# Patient Record
Sex: Female | Born: 1970 | Race: White | Hispanic: No | Marital: Married | Smoking: Never smoker
Health system: Southern US, Community
[De-identification: ages and names within clinical notes are randomized; demographics above are authoritative.]

## PROBLEM LIST (undated history)

## (undated) DIAGNOSIS — E063 Autoimmune thyroiditis: Secondary | ICD-10-CM

## (undated) DIAGNOSIS — F32A Depression, unspecified: Secondary | ICD-10-CM

## (undated) DIAGNOSIS — F329 Major depressive disorder, single episode, unspecified: Secondary | ICD-10-CM

## (undated) DIAGNOSIS — R002 Palpitations: Secondary | ICD-10-CM

## (undated) DIAGNOSIS — N200 Calculus of kidney: Secondary | ICD-10-CM

## (undated) DIAGNOSIS — G43909 Migraine, unspecified, not intractable, without status migrainosus: Secondary | ICD-10-CM

## (undated) HISTORY — PX: WISDOM TOOTH EXTRACTION: SHX21

## (undated) HISTORY — DX: Palpitations: R00.2

---

## 1998-04-30 ENCOUNTER — Inpatient Hospital Stay (HOSPITAL_COMMUNITY): Admission: AD | Admit: 1998-04-30 | Discharge: 1998-04-30 | Payer: Self-pay | Admitting: *Deleted

## 1998-06-19 ENCOUNTER — Ambulatory Visit (HOSPITAL_COMMUNITY): Admission: RE | Admit: 1998-06-19 | Discharge: 1998-06-19 | Payer: Self-pay | Admitting: Obstetrics and Gynecology

## 1998-08-27 ENCOUNTER — Inpatient Hospital Stay (HOSPITAL_COMMUNITY): Admission: AD | Admit: 1998-08-27 | Discharge: 1998-08-30 | Payer: Self-pay | Admitting: Obstetrics & Gynecology

## 1998-08-30 ENCOUNTER — Encounter (HOSPITAL_COMMUNITY): Admission: RE | Admit: 1998-08-30 | Discharge: 1998-11-28 | Payer: Self-pay | Admitting: *Deleted

## 1998-10-02 ENCOUNTER — Other Ambulatory Visit: Admission: RE | Admit: 1998-10-02 | Discharge: 1998-10-02 | Payer: Self-pay | Admitting: *Deleted

## 1999-04-09 ENCOUNTER — Other Ambulatory Visit: Admission: RE | Admit: 1999-04-09 | Discharge: 1999-04-09 | Payer: Self-pay | Admitting: *Deleted

## 1999-05-22 ENCOUNTER — Other Ambulatory Visit: Admission: RE | Admit: 1999-05-22 | Discharge: 1999-05-22 | Payer: Self-pay | Admitting: *Deleted

## 1999-05-22 ENCOUNTER — Encounter (INDEPENDENT_AMBULATORY_CARE_PROVIDER_SITE_OTHER): Payer: Self-pay | Admitting: Specialist

## 1999-10-24 ENCOUNTER — Inpatient Hospital Stay (HOSPITAL_COMMUNITY): Admission: AD | Admit: 1999-10-24 | Discharge: 1999-10-24 | Payer: Self-pay | Admitting: *Deleted

## 1999-12-12 ENCOUNTER — Encounter: Admission: RE | Admit: 1999-12-12 | Discharge: 1999-12-21 | Payer: Self-pay | Admitting: Obstetrics and Gynecology

## 1999-12-19 ENCOUNTER — Inpatient Hospital Stay (HOSPITAL_COMMUNITY): Admission: AD | Admit: 1999-12-19 | Discharge: 1999-12-19 | Payer: Self-pay | Admitting: Unknown Physician Specialty

## 2000-01-09 ENCOUNTER — Inpatient Hospital Stay (HOSPITAL_COMMUNITY): Admission: AD | Admit: 2000-01-09 | Discharge: 2000-01-09 | Payer: Self-pay | Admitting: *Deleted

## 2000-01-24 ENCOUNTER — Inpatient Hospital Stay (HOSPITAL_COMMUNITY): Admission: AD | Admit: 2000-01-24 | Discharge: 2000-01-26 | Payer: Self-pay | Admitting: Pediatrics

## 2000-01-27 ENCOUNTER — Encounter: Admission: RE | Admit: 2000-01-27 | Discharge: 2000-02-26 | Payer: Self-pay | Admitting: *Deleted

## 2000-03-20 ENCOUNTER — Other Ambulatory Visit: Admission: RE | Admit: 2000-03-20 | Discharge: 2000-03-20 | Payer: Self-pay | Admitting: *Deleted

## 2000-09-03 ENCOUNTER — Other Ambulatory Visit: Admission: RE | Admit: 2000-09-03 | Discharge: 2000-09-03 | Payer: Self-pay | Admitting: *Deleted

## 2001-10-14 ENCOUNTER — Other Ambulatory Visit: Admission: RE | Admit: 2001-10-14 | Discharge: 2001-10-14 | Payer: Self-pay | Admitting: *Deleted

## 2002-03-09 ENCOUNTER — Encounter: Admission: RE | Admit: 2002-03-09 | Discharge: 2002-03-09 | Payer: Self-pay | Admitting: *Deleted

## 2002-03-16 ENCOUNTER — Inpatient Hospital Stay (HOSPITAL_COMMUNITY): Admission: AD | Admit: 2002-03-16 | Discharge: 2002-03-16 | Payer: Self-pay | Admitting: *Deleted

## 2002-05-14 ENCOUNTER — Inpatient Hospital Stay (HOSPITAL_COMMUNITY): Admission: AD | Admit: 2002-05-14 | Discharge: 2002-05-14 | Payer: Self-pay | Admitting: *Deleted

## 2002-05-16 ENCOUNTER — Encounter (INDEPENDENT_AMBULATORY_CARE_PROVIDER_SITE_OTHER): Payer: Self-pay | Admitting: Specialist

## 2002-05-16 ENCOUNTER — Inpatient Hospital Stay (HOSPITAL_COMMUNITY): Admission: AD | Admit: 2002-05-16 | Discharge: 2002-05-18 | Payer: Self-pay | Admitting: *Deleted

## 2002-05-19 ENCOUNTER — Encounter: Admission: RE | Admit: 2002-05-19 | Discharge: 2002-06-18 | Payer: Self-pay | Admitting: *Deleted

## 2002-09-29 ENCOUNTER — Encounter: Payer: Self-pay | Admitting: Gastroenterology

## 2002-09-29 ENCOUNTER — Encounter: Admission: RE | Admit: 2002-09-29 | Discharge: 2002-09-29 | Payer: Self-pay | Admitting: Gastroenterology

## 2002-10-06 ENCOUNTER — Other Ambulatory Visit: Admission: RE | Admit: 2002-10-06 | Discharge: 2002-10-06 | Payer: Self-pay | Admitting: *Deleted

## 2002-10-12 ENCOUNTER — Encounter: Payer: Self-pay | Admitting: Family Medicine

## 2002-10-12 ENCOUNTER — Encounter: Admission: RE | Admit: 2002-10-12 | Discharge: 2002-10-12 | Payer: Self-pay | Admitting: Family Medicine

## 2004-03-15 ENCOUNTER — Encounter (INDEPENDENT_AMBULATORY_CARE_PROVIDER_SITE_OTHER): Payer: Self-pay | Admitting: *Deleted

## 2004-03-15 ENCOUNTER — Ambulatory Visit (HOSPITAL_COMMUNITY): Admission: RE | Admit: 2004-03-15 | Discharge: 2004-03-15 | Payer: Self-pay | Admitting: Gastroenterology

## 2005-03-08 ENCOUNTER — Other Ambulatory Visit: Admission: RE | Admit: 2005-03-08 | Discharge: 2005-03-08 | Payer: Self-pay | Admitting: Obstetrics and Gynecology

## 2005-07-12 ENCOUNTER — Inpatient Hospital Stay (HOSPITAL_COMMUNITY): Admission: AD | Admit: 2005-07-12 | Discharge: 2005-07-12 | Payer: Self-pay | Admitting: Obstetrics and Gynecology

## 2005-07-18 ENCOUNTER — Encounter: Admission: RE | Admit: 2005-07-18 | Discharge: 2005-07-28 | Payer: Self-pay | Admitting: Obstetrics and Gynecology

## 2005-09-15 ENCOUNTER — Inpatient Hospital Stay (HOSPITAL_COMMUNITY): Admission: AD | Admit: 2005-09-15 | Discharge: 2005-09-15 | Payer: Self-pay | Admitting: Obstetrics and Gynecology

## 2005-09-20 ENCOUNTER — Inpatient Hospital Stay (HOSPITAL_COMMUNITY): Admission: AD | Admit: 2005-09-20 | Discharge: 2005-09-23 | Payer: Self-pay | Admitting: Obstetrics and Gynecology

## 2006-04-07 ENCOUNTER — Other Ambulatory Visit: Admission: RE | Admit: 2006-04-07 | Discharge: 2006-04-07 | Payer: Self-pay | Admitting: Obstetrics and Gynecology

## 2007-11-14 ENCOUNTER — Emergency Department (HOSPITAL_COMMUNITY): Admission: EM | Admit: 2007-11-14 | Discharge: 2007-11-14 | Payer: Self-pay | Admitting: Emergency Medicine

## 2010-12-14 NOTE — H&P (Signed)
[name], [name]           ACCOUNT NO.:  [id number]   MEDICAL RECORD NO.:  [id number]          PATIENT TYPE:  INP   LOCATION:  9120                          FACILITY:  [facility]   PHYSICIAN:  Janine Limbo, M.D.DATE OF BIRTH:  Apr 24, 1971   DATE OF ADMISSION:  09/20/2005  DATE OF DISCHARGE:                                HISTORY & PHYSICAL   HISTORY OF PRESENT ILLNESS:  [name] is a 40 year old gravida 4, para  3-0-0-3 who is admitted at 38-5/7 weeks who is admitted for induction  secondary to insulin-dependent gestational diabetes and history of  macrosomia. Pregnancy has been remarkable for (1) RH negative, (2) history  of abnormal Pap smear, (3) gestational diabetes, insulin dependent with this  pregnancy, (4) history of gestational diabetes with her last pregnancy, (5)  history of macrosomia with her last baby weighing 9 pounds, (6) history of  irritable bowel syndrome, (7) history of kidney stones, (8) history of  migraines, (9) history of depression and anxiety with the patient on Zoloft.   PRENATAL LABORATORY DATA:  Her blood type is A negative, RH antibody  negative, VDRL nonreactive, rubella titer positive, hepatitis B surface  antigen negative, HIV is declined, cystic fibrosis testing was declined.  Gonorrhea and Chlamydia cultures were declined. Pap smear in August of 2006  was within normal limits.  Hemoglobin upon entry into practice was 13.2; it  was 11.6 at 26 weeks. She had an 18 week Glucola that was normal at 114.  She received RhoGAM at 27 weeks. She had another Glucola at 26 weeks with  values of 136. She had a 3 hour GTT showing all values abnormal.  She was  then referred to nutritional management center for fasting blood sugar and 2  hour PC's and transferred to physician care.  Quadruple screen was declined.  Group B Strep culture was negative at 36 weeks.   PRESENT OBSTETRICAL HISTORY:  Patient entered care at approximately 10  weeks.  She had an  18 week Glucola that was normal. She was on Zoloft and  occasional Vistaril.  She had another Glucola at 26 weeks with value of 136.  She then had an abnormal 3 hour GTT on all values.  She received RhoGAM.  She was transferred to the physician's care and referred to the nutritional  management center for diabetic education.  She was placed on insulin at  approximately 31 weeks and was taking NPH 10 units at q.h.s. NST's were  begun at 32 weeks, twice weekly.  Her fasting's were generally less than 90  once she started insulin and 2 hour PC's were generally less than 120.  she  had a small amount of bleeding at 30 weeks.  This was determined to be  probably from an external hemorrhoid.  Her NST's remained reactive.  She had  an ultrasound at 18 weeks with normal growth and development.  She had an  ultrasound at 34 weeks showing growth at the 82nd percentile and BPP of 8  out of 8 with normal fluid.  NST's continued to be reactive throughout the  rest of her pregnancy.  Group B Strep culture was negative.  Repeat  ultrasound at 37 weeks showed growth at 74th percentile with BPP of 8 out of  8.  Fetus was in an oblique presentation at that time.  She had another  ultrasound at 38 weeks showing vertex presentation, growth at the 71st  percentile, estimated fetal weight of 7 pounds, 11 ounces, that was on  September 19, 2005.  2 hour PC's per patient had been within normal limits.  The patient's cervix was 3, 50%, vertex at a minus 2 station.  The decision  was made to admit her for induction this evening.   PAST OBSTETRICAL HISTORY:  In 2000 she had a vaginal birth of a female  infant, weight 7 pounds 4 ounces at 38 weeks, she was in labor 23 hours, she  had epidural anesthesia, in 2001 she had a vaginal birth of a female infant,  weight 6 pounds, 14 ounces at 39 weeks; she was in labor 7 hours, she had  epidural anesthesia without complications.  In 2003 she had a vaginal birth  of a female  infant weight 9 pounds at 39 weeks; she was in labor 23 hours, she  had epidural anesthesia, baby did have a broken clavicle, was in NICU for 2  days but there was no documentation of shoulder dystocia.  The patient did  have gestational diabetes with her 2003 pregnancy.  She was also treated for  postpartum depression with Zoloft and Vistaril. She did receive RhoGAM in  the past.   PAST MEDICAL HISTORY:  She was on oral contraceptives in the past and had an  IUD after her last pregnancy. In 2000 she had a colposcopy but had no  abnormal Pap's since that time. The patient reports the usual childhood  illnesses.  She has a history of irritable bowel syndrome, occasional  bladder infections.  She had a kidney infection many years ago and had  kidney stones in 1995 and 2000.  She was diagnosed with a spongy kidney.  The patient does have a history of migraines.  The patient broke her arm as  a child and broke her foot as an adult.   PAST SURGICAL HISTORY:  Includes wisdom teeth extraction in 1990's,  colonoscopy and endoscopy for irritable bowel syndrome.  She also was  hospitalized in the past with mononucleosis and dehydration.  She has been  hospitalized for childbirth X3.   ALLERGIES:  Patient is SENSITIVE to PENICILLIN which causes a rash.   FAMILY HISTORY:  Her maternal grandmother and paternal grandmother are  hypertensive on medication.  Maternal grandmother has type 2 diabetes,  paternal grandmother had thyroid issues.  Her mother and sister have  migraines.  Her maternal grandfather had  Alzheimer's disease.  Paternal  grandfather had Parkinson's.  Maternal grandfather had skin cancer.  Sister  has also had skin cancer.  Her mother is being treated for depression.  Her  mother smokes.  Maternal grandfather was an alcoholic.   GENETIC HISTORY:  The patient's paternal cousin was born with a hole in the heart.  The patient's sister and her sister's daughter were born with eye   problems.  The father of the baby's niece was also born with eye problems.   SOCIAL HISTORY:  The patient is married to the father of the baby.  He is  involved and supportive.  His name is [employer].  The patient has  three years of college, she is a homemaker.  Her husband  has graduate  education.  He is a IT trainer and a controller.  She originally was followed by  the certified nurse midwife service but then was transferred to the  physician's service when her gestational diabetes was diagnosed.  She denies  any alcohol, drug or tobacco use during this pregnancy.  She denies any  domestic violence. She does have a history of depression and has been on  Zoloft and Vistaril during her pregnancy.   PHYSICAL EXAMINATION:  GENERAL APPEARANCE:  Patient is Caucasian.  VITAL SIGNS:  Stable. Patient is afebrile.  HEENT:  Within normal limits.  LUNGS:  Breath sounds clear.  HEART:  Regular rate and rhythm without murmurs, rubs.  BREASTS:  Soft and nontender.  ABDOMEN:  Fundal height is approximately 39 cm.  Estimated fetal weight is 7  to 8 pounds.  Uterine contractions are very occasional and mild.  PELVIC:  Cervix was 3 cm, 50%, vertex at a minus 2 station to minus 3  station per RN examination.  Fetal heart rate is reactive and reassuring.  There are occasional contractions noted.  EXTREMITIES:  Deep tendon reflexes are 2+ without clonus.  There is a trace  edema noted.   IMPRESSION:  1.  Intrauterine pregnancy at 38-5/7 weeks.  2.  Gestational diabetes, insulin dependent.  3.  History of macrosomia and fractured clavicle with her last baby.  4.  Negative group B Strep.  5.  History of depression and anxiety with patient currently on Zoloft and      occasional Vistaril.   PLAN:  1.  Admit to birthing suite for consult with Dr. Marline Backbone as      attending physician.  2.  Routine physician orders.  3.  Dr. Stefano Gaul plans Cytotec through the night then initiation of Pitocin       in the morning.  4.  10 units of NPH Insulin will be given at bedtime.  A fasting CBG will be      done in the morning.  5.  M.D.'s will follow.      [name], C.N.M.      Janine Limbo, M.D.  Electronically Signed    VLL/MEDQ  D:  09/20/2005  T:  09/21/2005  Job:  [id number]

## 2010-12-14 NOTE — Op Note (Signed)
NAME:  [name], PITONES                     ACCOUNT NO.:  [id number]   MEDICAL RECORD NO.:  [id number]                   PATIENT TYPE:  AMB   LOCATION:  ENDO                                 FACILITY:  [facility]   PHYSICIAN:  Danise Edge, M.D.                DATE OF BIRTH:  Feb 29, 1971   DATE OF PROCEDURE:  03/15/2004  DATE OF DISCHARGE:                                 OPERATIVE REPORT   PROCEDURE:  Esophagogastroduodenoscopy.   INDICATIONS FOR PROCEDURE:  Ms. [name] is a 40 year old female  born 05-May-1971.  Ms. [name] has chronic diarrhea plus nocturnal  diarrhea. Her serum transglutaminase antibody was negative. Upper endoscopy  is scheduled to perform small bowel biopsies to rule out celiac sprue.   ENDOSCOPIST:  Danise Edge, M.D.   PREMEDICATION:  Versed 10 mg, Demerol 50 mg.   DESCRIPTION OF PROCEDURE:  After obtaining informed consent, Ms. Brashears  was placed in the left lateral decubitus position. I administered  intravenous Demerol and intravenous Versed to achieve conscious sedation for  the procedure. The patient's blood pressure, oxygen saturation and cardiac  rhythm were monitored throughout the procedure and documented in the medical  record.   The Olympus gastroscope was passed through the posterior hypopharynx into  the proximal esophagus without difficulty. The hypopharynx, larynx and vocal  cords appeared normal.   ESOPHAGOSCOPY:  The proximal, mid and lower segments of the esophageal  appear normal.   GASTROSCOPY:  Retroflexed view of the gastric cardia and fundus was normal.  The gastric body, antrum and pylorus appear normal.   DUODENOSCOPY:  The duodenal bulb, mid duodenum and distal duodenum appear  normal. Six biopsies were taken from the second and third portions of the  duodenum to rule out celiac sprue.   ASSESSMENT:  Normal esophagogastroduodenoscopy. Small bowel biopsies rule  out celiac sprue pending.                                  Danise Edge, M.D.    MJ/MEDQ  D:  03/15/2004  T:  03/16/2004  Job:  [id number]   cc:   C. Duane Lope, M.D.  [address]  [place]  [place] [address]  Fax: [phone]

## 2011-01-17 ENCOUNTER — Observation Stay (HOSPITAL_COMMUNITY)
Admission: EM | Admit: 2011-01-17 | Discharge: 2011-01-18 | Disposition: A | Discharge status: Home / Self Care | Payer: BC Managed Care – PPO | Attending: Urology | Admitting: Urology

## 2011-01-17 ENCOUNTER — Emergency Department (HOSPITAL_COMMUNITY): Payer: BC Managed Care – PPO

## 2011-01-17 DIAGNOSIS — E063 Autoimmune thyroiditis: Secondary | ICD-10-CM | POA: Insufficient documentation

## 2011-01-17 DIAGNOSIS — R1031 Right lower quadrant pain: Secondary | ICD-10-CM | POA: Insufficient documentation

## 2011-01-17 DIAGNOSIS — N201 Calculus of ureter: Principal | ICD-10-CM | POA: Insufficient documentation

## 2011-01-17 DIAGNOSIS — G43909 Migraine, unspecified, not intractable, without status migrainosus: Secondary | ICD-10-CM | POA: Insufficient documentation

## 2011-01-17 LAB — DIFFERENTIAL
Basophils Absolute: 0 10*3/uL (ref 0.0–0.1)
Basophils Relative: 0 % (ref 0–1)
Eosinophils Absolute: 0.1 10*3/uL (ref 0.0–0.7)
Eosinophils Relative: 1 % (ref 0–5)
Lymphocytes Relative: 29 % (ref 12–46)
Lymphs Abs: 2.1 10*3/uL (ref 0.7–4.0)
Monocytes Absolute: 0.7 10*3/uL (ref 0.1–1.0)
Monocytes Relative: 9 % (ref 3–12)
Neutro Abs: 4.4 10*3/uL (ref 1.7–7.7)
Neutrophils Relative %: 61 % (ref 43–77)

## 2011-01-17 LAB — URINALYSIS, ROUTINE W REFLEX MICROSCOPIC
Bilirubin Urine: NEGATIVE
Glucose, UA: NEGATIVE mg/dL
Ketones, ur: NEGATIVE mg/dL
Protein, ur: NEGATIVE mg/dL

## 2011-01-17 LAB — CBC
HCT: 37.4 % (ref 36.0–46.0)
Hemoglobin: 12.2 g/dL (ref 12.0–15.0)
MCH: 26.5 pg (ref 26.0–34.0)
MCHC: 32.6 g/dL (ref 30.0–36.0)
MCV: 81.3 fL (ref 78.0–100.0)

## 2011-01-17 LAB — COMPREHENSIVE METABOLIC PANEL
ALT: 13 U/L (ref 0–35)
AST: 16 U/L (ref 0–37)
CO2: 24 mEq/L (ref 19–32)
Calcium: 9.3 mg/dL (ref 8.4–10.5)
GFR calc non Af Amer: >60 mL/min (ref >60)
Potassium: 4.1 mEq/L (ref 3.5–5.1)
Sodium: 140 mEq/L (ref 135–145)

## 2011-01-17 LAB — URINE MICROSCOPIC-ADD ON

## 2011-01-17 LAB — POCT PREGNANCY, URINE: Preg Test, Ur: NEGATIVE

## 2011-01-18 LAB — SURGICAL PCR SCREEN
MRSA, PCR: NEGATIVE
Staphylococcus aureus: NEGATIVE

## 2011-01-23 NOTE — Op Note (Signed)
**Note Molina via Obfuscation** NAMEJUDI, [name]           ACCOUNT NO.:  [id number]  MEDICAL RECORD NO.:  [id number]  LOCATION:  1444                         FACILITY:  [facility]  PHYSICIAN:  Shereese Bonnie C. Vernie Ammons, M.D.  DATE OF BIRTH:  [birth year]  DATE OF PROCEDURE:  01/18/2011 DATE OF DISCHARGE:                              OPERATIVE REPORT   PREOPERATIVE DIAGNOSIS:  Right ureteral calculus with obstruction.  POSTOPERATIVE DIAGNOSIS:  Right ureteral calculus with obstruction.  PROCEDURES: 1. Cystoscopy with right retrograde pyelogram. 2. Right ureteroscopy with laser lithotripsy. 3. Right ureteroscopic stone extraction. 4. Right double-J stent placement.  SURGEON:  Michon Kaczmarek C. Vernie Ammons, M.D.  ANESTHESIA:  General.  SPECIMENS:  Stone given to the patient.  DRAINS:  A 5-French 24-cm Polaris stent (no string).  BLOOD LOSS:  Minimal.  COMPLICATIONS:  None.  INDICATIONS:  The patient is a 40 year old female who has a history of nephrolithiasis.  She had 48 hours of right lower quadrant pain radiating in the low back.  She experienced severe pain with associated nausea and presented to the emergency room where a CT scan was obtained and revealed an 8 x 9 mm stone in the mid right ureter with proximal dilatation of the ureter and intrarenal collecting system.  The patient's pain was poorly controlled overnight and therefore I discussed ureteroscopic extraction of the stone with her.  The risks, complications, alternatives and limitations were discussed as well.  She understands and elects to proceed.  DESCRIPTION OF OPERATION:  After informed consent, the patient was brought to the major OR, placed on the table and administered general anesthesia.  She was then moved to the dorsal lithotomy position and the genitalia was sterilely prepped and draped.  An official time-out was then performed.  The T2 6-French rigid cystoscope with 12-degree lens was then passed under direct vision into the bladder.  The  bladder was then fully and systematically inspected and noted be free of any tumor, stones or inflammatory lesions.  Ureteral orifices were of normal configuration and position.  A 6-French open-ended ureteral catheter was then passed through the cystoscope and into the distal right ureteral orifice.  I then performed a retrograde pyelogram in standard fashion injecting full strength contrast through the open-ended stent and up the right ureter under direct fluoroscopic control.  This revealed a filling defect in the ureter just overlying the sacrum with some slight dilatation of the ureter proximally.  No other filling defects or abnormalities could be seen within the ureter.  The intrarenal collecting system appeared normal.  A 0.038-inch floppy tip guidewire was then passed through the open-ended stent, up the right ureter and into the area of the renal pelvis under fluoroscopy.  This was left in place and the cystoscope was removed.  I then proceeded with ureteroscopy.  The 6-French rigid ureteroscope was passed alongside the guidewire and up the right ureter to a point where I could visualize the stone.  I then proceeded to fragment the stone with the holmium laser using a 200-micron fiber.  After the stone was fully fragmented, I used a nitinol basket to extract all of the stone fragments.  At the end of the procedure, I passed the ureteroscope all the way  to the level of the renal pelvis with no stone fragments being noted.  As I withdrew the scope, the area where the stone was located and treated was noted to be intact with no evidence of ureteral injury or perforation.  The cystoscope was back loaded over the guidewire and a stent was then passed over the guidewire into the area of the renal pelvis.  As the guidewire was removed, good curl was noted in the renal pelvis.  I then drained the bladder and removed the cystoscope and the patient was awakened and taken to recovery  room in stable and satisfactory condition.  She tolerated the procedure well with no intraoperative complications.  She will be given a prescription for 30 Tylox and 30 200 mg Pyridium tablets as well as written instructions from my office.  She will follow up with me in 1 week and be discharged from the hospital later this morning.     [name] C. Vernie Ammons, M.D.     MCO/MEDQ  D:  01/18/2011  T:  01/18/2011  Job:  [id number]  Electronically Signed by Ihor Gully M.D. on 01/23/2011 05:35:29 PM

## 2011-05-08 ENCOUNTER — Other Ambulatory Visit: Payer: Self-pay | Admitting: Psychiatry

## 2011-05-08 DIAGNOSIS — G43009 Migraine without aura, not intractable, without status migrainosus: Secondary | ICD-10-CM

## 2011-05-14 ENCOUNTER — Ambulatory Visit
Admission: RE | Admit: 2011-05-14 | Discharge: 2011-05-14 | Disposition: A | Discharge status: Home / Self Care | Payer: BC Managed Care – PPO | Source: Ambulatory Visit | Attending: Psychiatry | Admitting: Psychiatry

## 2011-05-14 DIAGNOSIS — G43009 Migraine without aura, not intractable, without status migrainosus: Secondary | ICD-10-CM

## 2011-05-14 MED ORDER — GADOBENATE DIMEGLUMINE 529 MG/ML IV SOLN
12.0000 mL | Freq: Once | INTRAVENOUS | Status: AC | PRN
  Administered 2011-05-14: 12 mL via INTRAVENOUS

## 2013-08-23 ENCOUNTER — Emergency Department (HOSPITAL_COMMUNITY)
Admission: EM | Admit: 2013-08-23 | Discharge: 2013-08-24 | Disposition: A | Discharge status: Other Acute Inpatient Hospital | Payer: Managed Care, Other (non HMO) | Attending: Emergency Medicine | Admitting: Emergency Medicine

## 2013-08-23 ENCOUNTER — Encounter (HOSPITAL_COMMUNITY): Payer: Self-pay | Admitting: Emergency Medicine

## 2013-08-23 DIAGNOSIS — F3289 Other specified depressive episodes: Secondary | ICD-10-CM | POA: Insufficient documentation

## 2013-08-23 DIAGNOSIS — R45851 Suicidal ideations: Secondary | ICD-10-CM

## 2013-08-23 DIAGNOSIS — F32A Depression, unspecified: Secondary | ICD-10-CM

## 2013-08-23 DIAGNOSIS — R51 Headache: Secondary | ICD-10-CM | POA: Insufficient documentation

## 2013-08-23 DIAGNOSIS — F329 Major depressive disorder, single episode, unspecified: Secondary | ICD-10-CM | POA: Insufficient documentation

## 2013-08-23 DIAGNOSIS — Z87442 Personal history of urinary calculi: Secondary | ICD-10-CM | POA: Insufficient documentation

## 2013-08-23 DIAGNOSIS — Z3202 Encounter for pregnancy test, result negative: Secondary | ICD-10-CM | POA: Insufficient documentation

## 2013-08-23 DIAGNOSIS — Z862 Personal history of diseases of the blood and blood-forming organs and certain disorders involving the immune mechanism: Secondary | ICD-10-CM | POA: Insufficient documentation

## 2013-08-23 DIAGNOSIS — Z880 Allergy status to penicillin: Secondary | ICD-10-CM | POA: Insufficient documentation

## 2013-08-23 DIAGNOSIS — G43909 Migraine, unspecified, not intractable, without status migrainosus: Secondary | ICD-10-CM | POA: Insufficient documentation

## 2013-08-23 DIAGNOSIS — Z8639 Personal history of other endocrine, nutritional and metabolic disease: Secondary | ICD-10-CM | POA: Insufficient documentation

## 2013-08-23 DIAGNOSIS — Z79899 Other long term (current) drug therapy: Secondary | ICD-10-CM | POA: Insufficient documentation

## 2013-08-23 HISTORY — DX: Depression, unspecified: F32.A

## 2013-08-23 HISTORY — DX: Migraine, unspecified, not intractable, without status migrainosus: G43.909

## 2013-08-23 HISTORY — DX: Major depressive disorder, single episode, unspecified: F32.9

## 2013-08-23 HISTORY — DX: Autoimmune thyroiditis: E06.3

## 2013-08-23 HISTORY — DX: Calculus of kidney: N20.0

## 2013-08-23 LAB — COMPREHENSIVE METABOLIC PANEL
ALBUMIN: 3.9 g/dL (ref 3.5–5.2)
ALK PHOS: 72 U/L (ref 39–117)
ALT: 14 U/L (ref 0–35)
AST: 18 U/L (ref 0–37)
BUN: 12 mg/dL (ref 6–23)
CALCIUM: 9.3 mg/dL (ref 8.4–10.5)
CO2: 27 mEq/L (ref 19–32)
CREATININE: 0.85 mg/dL (ref 0.50–1.10)
Chloride: 101 mEq/L (ref 96–112)
GFR calc non Af Amer: 84 mL/min — ABNORMAL LOW (ref >90)
GLUCOSE: 111 mg/dL — AB (ref 70–99)
POTASSIUM: 4 meq/L (ref 3.7–5.3)
Sodium: 139 mEq/L (ref 137–147)
TOTAL PROTEIN: 8.2 g/dL (ref 6.0–8.3)
Total Bilirubin: 0.3 mg/dL (ref 0.3–1.2)

## 2013-08-23 LAB — POCT PREGNANCY, URINE: Preg Test, Ur: NEGATIVE

## 2013-08-23 LAB — RAPID URINE DRUG SCREEN, HOSP PERFORMED
Amphetamines: NOT DETECTED
BARBITURATES: NOT DETECTED
BENZODIAZEPINES: NOT DETECTED
COCAINE: NOT DETECTED
OPIATES: NOT DETECTED
Tetrahydrocannabinol: NOT DETECTED

## 2013-08-23 LAB — CBC
HEMATOCRIT: 39.3 % (ref 36.0–46.0)
HEMOGLOBIN: 12.7 g/dL (ref 12.0–15.0)
MCH: 26.6 pg (ref 26.0–34.0)
MCHC: 32.3 g/dL (ref 30.0–36.0)
MCV: 82.2 fL (ref 78.0–100.0)
Platelets: 249 10*3/uL (ref 150–400)
RBC: 4.78 MIL/uL (ref 3.87–5.11)
RDW: 13.5 % (ref 11.5–15.5)
WBC: 6.4 10*3/uL (ref 4.0–10.5)

## 2013-08-23 LAB — SALICYLATE LEVEL

## 2013-08-23 LAB — ETHANOL

## 2013-08-23 LAB — TSH: TSH: 2.455 u[IU]/mL (ref 0.350–4.500)

## 2013-08-23 LAB — ACETAMINOPHEN LEVEL

## 2013-08-23 MED ORDER — METHYLPHENIDATE HCL ER 20 MG PO TBCR: 20.0000 mg | EXTENDED_RELEASE_TABLET | Freq: Every day | ORAL | Status: DC

## 2013-08-23 MED ORDER — SUMATRIPTAN SUCCINATE 50 MG PO TABS
50.0000 mg | ORAL_TABLET | Freq: Once | ORAL | Status: AC
  Administered 2013-08-23: 50 mg via ORAL
  Filled 2013-08-23: qty 1

## 2013-08-23 MED ORDER — ACETAMINOPHEN 325 MG PO TABS
650.0000 mg | ORAL_TABLET | Freq: Four times a day (QID) | ORAL | Status: DC | PRN
  Administered 2013-08-23: 650 mg via ORAL
  Filled 2013-08-23: qty 2

## 2013-08-23 MED ORDER — LORAZEPAM 0.5 MG PO TABS
0.5000 mg | ORAL_TABLET | Freq: Two times a day (BID) | ORAL | Status: DC
  Administered 2013-08-23: 0.5 mg via ORAL
  Filled 2013-08-23: qty 1

## 2013-08-23 MED ORDER — PAROXETINE HCL 20 MG PO TABS
40.0000 mg | ORAL_TABLET | Freq: Every day | ORAL | Status: DC
  Filled 2013-08-23: qty 2

## 2013-08-23 MED ORDER — GABAPENTIN 300 MG PO CAPS
1200.0000 mg | ORAL_CAPSULE | Freq: Every day | ORAL | Status: DC
  Administered 2013-08-23: 1200 mg via ORAL
  Filled 2013-08-23 (×2): qty 4

## 2013-08-23 MED ORDER — VERAPAMIL HCL ER 240 MG PO TBCR
240.0000 mg | EXTENDED_RELEASE_TABLET | Freq: Every day | ORAL | Status: DC
  Filled 2013-08-23: qty 1

## 2013-08-23 NOTE — ED Notes (Signed)
Psychiatrist  Cottle faxed over paperwork from office. Given to Nurse. Placed in chart.

## 2013-08-23 NOTE — Progress Notes (Signed)
   CARE MANAGEMENT ED NOTE 08/23/2013  Patient:  [name],Lisa L   Account Number:  [id number]  Date Initiated:  08/23/2013  Documentation initiated by:  Radford PaxFERRERO,Srihaan Mastrangelo  Subjective/Objective Assessment:   Patient presents to Ed with SI.     Subjective/Objective Assessment Detail:   Patient with history of depression.     Action/Plan:   Action/Plan Detail:   Anticipated DC Date:       Status Recommendation to Physician:   Result of Recommendation:    Other ED Services  Consult Working Plan    DC Planning Services  Other  PCP issues    Choice offered to / List presented to:            Status of service:  Completed, signed off  ED Comments:   ED Comments Detail:  Patient confirms her pcp is Dr. Duane LopeAlan Ross.  System updated.

## 2013-08-23 NOTE — ED Provider Notes (Signed)
CSN: [id number]     Arrival date & time 08/23/13  1428 History   First MD Initiated Contact with Patient 08/23/13 1600     Chief Complaint  Patient presents with  . Medical Clearance   (Consider location/radiation/quality/duration/timing/severity/associated sxs/prior Treatment) HPI Comments: 43 year old female presents with worsening depression and suicidal thoughts. She states she's been battling depression for "a while". She states she sees a psychiatrist and they've been changing medicines around. She is currently on Ritalin and Paxil. She's got Effexor and Abilify but due to insurance reasons she was taken off of these. These seemed to work better for her current regimen. She states that she's been having suicidal thoughts on and off for a long time but over the past week they've been getting significantly worse. She's been researching ways to possibly overdose. She doesn't think about begun her husband has at home. The husband has stated these remove the gun from home. The patient's acts on these yet. They state she's not want to act on these and is seeking help. Patient psychiatrist sent her here to be admitted. The patient is voluntarily coming in to the ER. Patient states that she has had some weight gain over the past couple months and has a remote history of Hashimoto's thyroid problems. The patient does not currently take any treatment for thyroid issues. The patient currently has a migraine headache.   Past Medical History  Diagnosis Date  . Hashimoto's disease   . Depression   . Migraine   . Kidney stones    History reviewed. No pertinent past surgical history. History reviewed. No pertinent family history. History  Substance Use Topics  . Smoking status: Never Smoker   . Smokeless tobacco: Not on file  . Alcohol Use: No   OB History   Grav Para Term Preterm Abortions TAB SAB Ect Mult Living                 Review of Systems  Constitutional: Positive for unexpected weight  change.  Gastrointestinal: Negative for abdominal pain.  Musculoskeletal: Positive for arthralgias. Negative for neck pain.  Neurological: Positive for headaches. Negative for weakness.  Psychiatric/Behavioral: Positive for suicidal ideas and dysphoric mood. Negative for self-injury.  All other systems reviewed and are negative.    Allergies  Penicillins  Home Medications   Current Outpatient Rx  Name  Route  Sig  Dispense  Refill  . gabapentin (NEURONTIN) 600 MG tablet   Oral   Take 2 tablets by mouth at bedtime.         Marland Kitchen LORazepam (ATIVAN) 0.5 MG tablet   Oral   Take 1 tablet by mouth 2 (two) times daily.         . methylphenidate (METADATE ER) 20 MG ER tablet   Oral   Take 1 tablet by mouth daily.         Marland Kitchen PARoxetine (PAXIL) 40 MG tablet   Oral   Take 1 tablet by mouth daily.         . verapamil (CALAN-SR) 120 MG CR tablet   Oral   Take 2 tablets by mouth daily.          BP 142/85  Pulse 73  Temp(Src) 98.4 F (36.9 C) (Oral)  Resp 16  SpO2 100% Physical Exam  Nursing note and vitals reviewed. Constitutional: She is oriented to person, place, and time. She appears well-developed and well-nourished. No distress.  HENT:  Head: Normocephalic and atraumatic.  Right Ear: External ear  normal.  Left Ear: External ear normal.  Nose: Nose normal.  Eyes: Right eye exhibits no discharge. Left eye exhibits no discharge.  Cardiovascular: Normal rate, regular rhythm and normal heart sounds.   Pulmonary/Chest: Effort normal and breath sounds normal.  Abdominal: She exhibits no distension.  Neurological: She is alert and oriented to person, place, and time.  Skin: Skin is warm and dry.  Psychiatric: She exhibits a depressed mood. She expresses suicidal ideation. She expresses suicidal plans.    ED Course  Procedures (including critical care time) Labs Review Labs Reviewed  COMPREHENSIVE METABOLIC PANEL - Abnormal; Notable for the following:    Glucose,  Bld 111 (*)    GFR calc non Af Amer 84 (*)    All other components within normal limits  SALICYLATE LEVEL - Abnormal; Notable for the following:    Salicylate Lvl <2.0 (*)    All other components within normal limits  ACETAMINOPHEN LEVEL  CBC  ETHANOL  URINE RAPID DRUG SCREEN (HOSP PERFORMED)  TSH  POCT PREGNANCY, URINE   Imaging Review No results found.  EKG Interpretation   None       MDM   1. Suicidal thoughts   2. Depression    Feel patient is high risk for suicide and will need inpatient psychiatric admission. Screening labs show patient is medically clear. Stable for now, will put in psych ED for psych consult and wait for admission.    Audree CamelScott T Margarito Dehaas, MD 08/23/13 [id number]

## 2013-08-23 NOTE — ED Notes (Signed)
Pt states she takes Zomig for migraines.

## 2013-08-23 NOTE — ED Notes (Signed)
Pt's family is taking her belongings.

## 2013-08-23 NOTE — ED Notes (Signed)
See previous triage note. Pt hx of depression.pt reports x1 week thoughts of SI with plan to harm herself with her husbands gun or overdose.denies no trigger to cause SI. Hx of thyroid disease. Previous SI attempt in high school with overdose on xanax. Pt saw psychiatrist today and was sent to ED. Pt reports in past her depression was well controlled with abilify and effexor, stopped the abilify due to high copay, but not pts insurance cost has decreased and pt wants to go back on that medicine. At present calm and cooperative. Pt reports migraine at present 8/10.  And joint pain x1 week.

## 2013-08-23 NOTE — ED Notes (Signed)
Psychiatrist Alanson AlyCary Cottle [phone] office [phone] cell  referred Pt to ED with Dx of Major Depressive Disorder, Panic disorder and SI with a plan. Pt researched OD on Internet this past week and has access to a gun at home. Husband was notified and told to bring wife to ED.

## 2013-08-24 ENCOUNTER — Inpatient Hospital Stay (HOSPITAL_COMMUNITY)
Admission: AD | Admit: 2013-08-24 | Discharge: 2013-08-30 | DRG: 885 | Disposition: A | Discharge status: Home / Self Care | Payer: Managed Care, Other (non HMO) | Source: Intra-hospital | Attending: Psychiatry | Admitting: Psychiatry

## 2013-08-24 ENCOUNTER — Encounter (HOSPITAL_COMMUNITY): Payer: Self-pay | Admitting: *Deleted

## 2013-08-24 DIAGNOSIS — F410 Panic disorder [episodic paroxysmal anxiety] without agoraphobia: Secondary | ICD-10-CM | POA: Diagnosis present

## 2013-08-24 DIAGNOSIS — F329 Major depressive disorder, single episode, unspecified: Secondary | ICD-10-CM | POA: Diagnosis present

## 2013-08-24 DIAGNOSIS — F411 Generalized anxiety disorder: Secondary | ICD-10-CM | POA: Diagnosis present

## 2013-08-24 DIAGNOSIS — G4700 Insomnia, unspecified: Secondary | ICD-10-CM | POA: Diagnosis present

## 2013-08-24 DIAGNOSIS — R45851 Suicidal ideations: Secondary | ICD-10-CM

## 2013-08-24 DIAGNOSIS — F332 Major depressive disorder, recurrent severe without psychotic features: Principal | ICD-10-CM | POA: Diagnosis present

## 2013-08-24 DIAGNOSIS — E063 Autoimmune thyroiditis: Secondary | ICD-10-CM | POA: Diagnosis present

## 2013-08-24 MED ORDER — SUMATRIPTAN SUCCINATE 50 MG PO TABS
50.0000 mg | ORAL_TABLET | ORAL | Status: DC | PRN
  Administered 2013-08-24 – 2013-08-29 (×2): 50 mg via ORAL
  Filled 2013-08-24 (×2): qty 1

## 2013-08-24 MED ORDER — ALUM & MAG HYDROXIDE-SIMETH 200-200-20 MG/5ML PO SUSP
30.0000 mL | ORAL | Status: DC | PRN
Start: 2013-08-24 — End: 2013-08-30

## 2013-08-24 MED ORDER — PAROXETINE HCL 20 MG PO TABS
20.0000 mg | ORAL_TABLET | Freq: Every day | ORAL | Status: DC
  Administered 2013-08-25: 20 mg via ORAL
  Filled 2013-08-24 (×3): qty 1

## 2013-08-24 MED ORDER — VERAPAMIL HCL ER 240 MG PO TBCR
240.0000 mg | EXTENDED_RELEASE_TABLET | Freq: Every day | ORAL | Status: DC
  Administered 2013-08-24 – 2013-08-28 (×5): 240 mg via ORAL
  Filled 2013-08-24 (×7): qty 1

## 2013-08-24 MED ORDER — METHYLPHENIDATE HCL ER 20 MG PO TBCR: 20.0000 mg | EXTENDED_RELEASE_TABLET | Freq: Every day | ORAL | Status: DC

## 2013-08-24 MED ORDER — PAROXETINE HCL 20 MG PO TABS
40.0000 mg | ORAL_TABLET | Freq: Every day | ORAL | Status: DC
  Administered 2013-08-24: 40 mg via ORAL
  Filled 2013-08-24 (×2): qty 2

## 2013-08-24 MED ORDER — LORAZEPAM 0.5 MG PO TABS
0.5000 mg | ORAL_TABLET | Freq: Two times a day (BID) | ORAL | Status: DC
  Administered 2013-08-24 – 2013-08-26 (×5): 0.5 mg via ORAL
  Filled 2013-08-24 (×5): qty 1

## 2013-08-24 MED ORDER — METHYLPHENIDATE HCL ER (LA) 10 MG PO CP24
20.0000 mg | ORAL_CAPSULE | Freq: Every day | ORAL | Status: DC
  Administered 2013-08-24: 20 mg via ORAL
  Filled 2013-08-24: qty 2

## 2013-08-24 MED ORDER — MAGNESIUM HYDROXIDE 400 MG/5ML PO SUSP: 30.0000 mL | Freq: Every day | ORAL | Status: DC | PRN

## 2013-08-24 MED ORDER — ACETAMINOPHEN 325 MG PO TABS
650.0000 mg | ORAL_TABLET | Freq: Four times a day (QID) | ORAL | Status: DC | PRN
  Administered 2013-08-24 – 2013-08-30 (×3): 650 mg via ORAL
  Filled 2013-08-24 (×3): qty 2

## 2013-08-24 MED ORDER — GABAPENTIN 600 MG PO TABS
1200.0000 mg | ORAL_TABLET | Freq: Every day | ORAL | Status: DC
  Administered 2013-08-24 – 2013-08-29 (×6): 1200 mg via ORAL
  Filled 2013-08-24 (×8): qty 2

## 2013-08-24 MED ORDER — ARIPIPRAZOLE 2 MG PO TABS
1.0000 mg | ORAL_TABLET | Freq: Every day | ORAL | Status: DC
Start: 2013-08-24 — End: 2013-08-30
  Administered 2013-08-24 – 2013-08-29 (×6): 1 mg via ORAL
  Filled 2013-08-24 (×8): qty 1

## 2013-08-24 MED ORDER — BUSPIRONE HCL 15 MG PO TABS
7.5000 mg | ORAL_TABLET | Freq: Two times a day (BID) | ORAL | Status: DC
  Administered 2013-08-24 – 2013-08-30 (×12): 7.5 mg via ORAL
  Filled 2013-08-24 (×16): qty 1

## 2013-08-24 MED ORDER — TRAZODONE HCL 50 MG PO TABS
50.0000 mg | ORAL_TABLET | Freq: Every evening | ORAL | Status: DC | PRN
Start: 2013-08-24 — End: 2013-08-27
  Administered 2013-08-24 – 2013-08-26 (×3): 50 mg via ORAL
  Filled 2013-08-24 (×8): qty 1

## 2013-08-24 MED ORDER — BUPROPION HCL ER (SR) 100 MG PO TB12
100.0000 mg | ORAL_TABLET | Freq: Every day | ORAL | Status: DC
  Administered 2013-08-24 – 2013-08-30 (×7): 100 mg via ORAL
  Filled 2013-08-24 (×9): qty 1
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
## 2013-08-24 NOTE — Progress Notes (Signed)
The focus of this group is to educate the patient on the purpose and policies of crisis stabilization and provide a format to answer questions about their admission.  The group details unit policies and expectations of patients while admitted.  Patient did not attend 0900 nurse education orientation group this morning, was in bed.

## 2013-08-24 NOTE — Progress Notes (Signed)
Recreation Therapy Notes  Animal-Assisted Activity/Therapy (AAA/T) Program Checklist/Progress Notes Patient Eligibility Criteria Checklist & Daily Group note for Rec Tx Intervention  Date: 01.27.2015 Time: 2:45pm Location: [address] Dayroom    AAA/T Program Assumption of Risk Form signed by Patient/ or Parent Legal Guardian yes  Patient is free of allergies or sever asthma yes  Patient reports no fear of animals yes  Patient reports no history of cruelty to animals yes   Patient understands his/her participation is voluntary yes  Behavioral Response: Did not attend.   Kashari Chalmers L Nyashia Raney, LRT/CTRS  Ari Engelbrecht L 08/24/2013 5:40 PM 

## 2013-08-24 NOTE — Progress Notes (Signed)
Adult Psychoeducational Group Note  Date:  08/24/2013 Time:  8:00 pm  Group Topic/Focus:  Wrap-Up Group:   The focus of this group is to help patients review their daily goal of treatment and discuss progress on daily workbooks.  Participation Level:  Active  Participation Quality:  Appropriate and Sharing  Affect:  Appropriate  Cognitive:  Appropriate  Insight: Appropriate  Engagement in Group:  Engaged  Modes of Intervention:  Discussion, Education, Socialization and Support  Additional Comments:  Pt stated that she has learned to identify problems and to seek help from others. Pt stated that she is helpful and patient.   Laural BenesJohnson, Princetta Uplinger 08/24/2013, 10:13 PM
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
## 2013-08-24 NOTE — [facility] Group Notes (Signed)
[facility] LCSW Group Therapy      Feelings About Diagnosis 1:15 - 2:30 PM         08/24/2013  3:24 PM    Type of Therapy:  Group Therapy  Participation Level:  Active  Participation Quality:  Appropriate  Affect:  Appropriate  Cognitive:  Alert and Appropriate  Insight:  Developing/Improving and Engaged  Engagement in Therapy:  Developing/Improving and Engaged  Modes of Intervention:  Discussion, Education, Exploration, Problem-Solving, Rapport Building, Support  Summary of Progress/Problems:  Patient actively participated in group. Patient discussed past and present diagnosis and the effects it has had on  life.  Patient talked about family and society being judgmental and the stigma associated with having a mental health diagnosis.  She shared her diagnosis effects how she sees herself.  Patient shared she sees herself as broken/damaged and that is not how her life was supposed to be.  Wynn BankerHodnett, Glenroy Crossen Hairston 08/24/2013  3:24 PM
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
## 2013-08-24 NOTE — [facility] Suicide Risk Assessment (Signed)
[facility] INPATIENT:  Family/Significant Other Suicide Prevention Education  Suicide Prevention Education:  Contact Attempts; Heidi Minor; has been identified by the patient as the family member/significant other with whom the patient will be residing, and identified as the person(s) who will aid the patient in the event of a mental health crisis.  With written consent from the patient, two attempts were made to provide suicide prevention education, prior to and/or following the patient's discharge.  We were unsuccessful in providing suicide prevention education.  A suicide education pamphlet was given to the patient to share with family/significant other.  Patient was not endorsing SI at the time of admission.  [name], [name] 08/24/2013, 3:55 PM

## 2013-08-24 NOTE — Progress Notes (Signed)
Pt reports she is here because of increased depression/SI.  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  Pt states she is feeling better this evening and feels she just needed some rest.  Pt states she has attended groups today.  Pt is taking her meds and participating in her treatment plan.  Pt plans to return home at discharge.  Pt denies SI/HI/AV at this time.  Pt makes her needs known to staff.  Support and encouragement offered.  Safety maintained with q15 minute checks.

## 2013-08-24 NOTE — ED Notes (Signed)
Report called to RN Sandra, [facility], rm [phone].Pending [facility] transport. 

## 2013-08-24 NOTE — Tx Team (Signed)
Initial Interdisciplinary Treatment Plan  PATIENT STRENGTHS: (choose at least two) Average or above average intelligence Capable of independent living Motivation for treatment/growth Supportive family/friends  PATIENT STRESSORS: Medication change or noncompliance   PROBLEM LIST: Problem List/Patient Goals Date to be addressed Date deferred Reason deferred Estimated date of resolution  Suicide Thoughts 08/24/13     Depression 08/24/13                                                DISCHARGE CRITERIA:  Adequate post-discharge living arrangements  PRELIMINARY DISCHARGE PLAN: Participate in family therapy Return to previous living arrangement  PATIENT/FAMIILY INVOLVEMENT: This treatment plan has been presented to and reviewed with the patient, Garnet SierrasMelissa L Abrell, and/or family member.  The patient and family have been given the opportunity to ask questions and make suggestions.  Roselie SkinnerOgunjobi, Rhett Mutschler [facility] 08/24/2013, 3:05 AM
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
## 2013-08-24 NOTE — Progress Notes (Addendum)
D:  Patient's denied SI and HI.  Denied A/V hallucinations.  Denied pain.   A:  Medications administered per MD orders.  Emotional support and encouragement given patient. R:  Will continue to monitor patient for safety with 15 minute checks.  Safety maintained.  Patient's husband called, Lorin PicketScott, phone [phone], stated he is single parent of 4 children, would like to know patient's status and discharge date.

## 2013-08-25 MED ORDER — PAROXETINE HCL 10 MG PO TABS
10.0000 mg | ORAL_TABLET | Freq: Every day | ORAL | Status: DC
  Administered 2013-08-26: 10 mg via ORAL
  Filled 2013-08-25 (×2): qty 1
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
## 2013-08-25 NOTE — Progress Notes (Signed)
D Pt. Denies SI and HI.  NO complaints of pain or discomfort noted.  A Writer offered support and encouragement.  Discussed coping skills with pt.  R Pt. Remains safe on the unit.  Reports a good day.  Stating her depression has reduced from and 8 to a 1 as well as her anxiety level.

## 2013-08-25 NOTE — Progress Notes (Signed)
Adult Psychoeducational Group Note  Date:  08/25/2013 Time:  2:13 PM  Group Topic/Focus:  Personal Choices and Values:   The focus of this group is to help patients assess and explore the importance of values in their lives, how their values affect their decisions, how they express their values and what opposes their expression.  Participation Level:  Active  Participation Quality:  Appropriate  Affect:  Appropriate  Cognitive:  Appropriate  Insight: Appropriate  Engagement in Group:  Engaged  Modes of Intervention:  Discussion  Additional Comments:  Pt. Participated in group and shared  Tonita CongMcLaurin, Aurthur Wingerter L 08/25/2013, 2:13 PM

## 2013-08-25 NOTE — [facility] Group Notes (Signed)
[facility]   08/25/2013 9:42 AM    Participation Quality:  Appropraite  Mood/Affect:  Appropriate  Depression Rating:  0  Anxiety Rating:  1  Thoughts of Suicide:  No  Will you contract for safety?   NA  Current AVH:  No  Plan for Discharge/Comments:  Patient attended discharge planning group and actively participated in group.  She reports feeling much better today.  She advised of having outpatient services with Dr. Jennelle Humanottle and Sharlette DenseMickey Dew.  CSW provided all participants with daily workbook.   Transportation Means: Patient has transportation.   Supports:  Patient has a support system.   [name], [name]

## 2013-08-25 NOTE — [facility] Group Notes (Signed)
[facility] LCSW Group Therapy  Emotional Regulation 1:15 - 2: 30 PM        08/25/2013  3:30 PM   Type of Therapy:  Group Therapy  Participation Level:  Appropriate  Participation Quality:  Appropriate  Affect:  Appropriate  Cognitive:  Attentive Appropriate  Insight:  Developing/Improving Engaged  Engagement in Therapy:  Developing/Improving Engaged  Modes of Intervention:  Discussion Exploration Problem-Solving Supportive  Summary of Progress/Problems:  Group topic was emotional regulations.  Patient participated in the discussion and was able to identify an emotion that needed to regulated.  She advised she deals with feelings of failure.  She was able to state she sometimes sets her expectation to high and then becomes depressed and suicidal when things do not work out as planned.  Patient was able to identify approprite coping skills.  Wynn BankerHodnett, Len Kluver Hairston 08/25/2013 3:30 PM

## 2013-08-25 NOTE — Progress Notes (Signed)
Adult Psychoeducational Group Note  Date:  08/25/2013 Time:  8:58 PM  Group Topic/Focus:  Wrap-Up Group:   The focus of this group is to help patients review their daily goal of treatment and discuss progress on daily workbooks.  Participation Level:  Active  Participation Quality:  Appropriate  Affect:  Appropriate  Cognitive:  Appropriate  Insight: Appropriate  Engagement in Group:  Engaged  Modes of Intervention:  Support  Additional Comments:  Pt stated that she had a good day and that she really enjoyed group therapy today   Corina Stacy 08/25/2013, 8:58 PM

## 2013-08-25 NOTE — Tx Team (Signed)
Interdisciplinary Treatment Plan Update   Date Reviewed:  08/25/2013  Time Reviewed:  8:32 AM  Progress in Treatment:   Attending groups: Yes Participating in groups: Yes Taking medication as prescribed: Yes  Tolerating medication: Yes Family/Significant other contact made:  No, but will ask patient for consent for collateral contact Patient understands diagnosis: Yes  Discussing patient identified problems/goals with staff: Yes Medical problems stabilized or resolved: Yes Denies suicidal/homicidal ideation: Yes Patient has not harmed self or others: Yes  For review of initial/current patient goals, please see plan of care.  Estimated Length of Stay:    Reasons for Continued Hospitalization:  Anxiety Depression Medication stabilization Suicidal ideation  New Problems/Goals identified:    Discharge Plan or Barriers:   Home with outpatient follow up with Dr. Jennelle Humanottle and Sharlette DenseMickey Dew  Additional Comments:  [facility] for increased symptoms of depression, anxiety and suicidal ideation with plan of driving her car into accident or going into woods and shooting herself with a gun. Patient was referred for inpatient psychiatric hospitalization, crisis management and safety monitoring by her primary psychiatrist Dr. Jennelle Humanottle who is concerned about her safety and not responding to her current medications. Patient reported her medication Abilify was stopped because of problems with copayment otherwise is helpful and wants to restart, and reportedly stimulant medication is not helpful. Dr. Jennelle Humanottle had also told patient that if medication had to be adjusted, the hospital was the best place. Patient has been suffering with symptoms of depression for the last two weeks with no end in sight. She she has been feeling sad, down and tired, poor concentration, energy insomnia and trouble to care for herself and her children at home.  Patient husband was aware and removed guns from house.   Attendees:  Patient:  08/25/2013 8:32 AM   Signature: Mervyn GayJ. Jonnalagadda, MD 08/25/2013 8:32 AM  Signature:   08/25/2013 8:32 AM  Signature:  Claudette Headonrad Withrow, NP 08/25/2013 8:32 AM  Signature:Beverly Terrilee CroakKnight, RN 08/25/2013 8:32 AM  Signature:   08/25/2013 8:32 AM  Signature:  Juline PatchQuylle Dontrell Stuck, LCSW 08/25/2013 8:32 AM  Signature:  Reyes Ivanhelsea Horton, LCSW 08/25/2013 8:32 AM  Signature:  Leisa LenzValerie Enoch, Care Coordinator 08/25/2013 8:32 AM  Signature:  Aloha GellKrista Dopson, RN 08/25/2013 8:32 AM  Signature: Leighton ParodyBritney Tyson, RN 08/25/2013  8:32 AM  Signature:   Onnie BoerJennifer Clark, RN [facility] 08/25/2013  8:32 AM  Signature:  Harold Barbanonecia Byrd, RN 08/25/2013  8:32 AM    Scribe for Treatment Team:   Juline PatchQuylle Rekita Miotke,  08/25/2013 8:32 AM

## 2013-08-25 NOTE — Progress Notes (Signed)
D: Patient cooperative with staff and peers. Patient's affect is appropriate to circumstance and mood is anxious. She reported on the self inventory sheet that she's sleeping well, appetite and ability to pay attention are good and energy level is low. Patient rated depression "1" and feelings of hopelessness "3". She's attending scheduled groups and interactive with peers in the milieu. Patient adhering to medication regimen.  A: Support and encouragement provided to patient. Administered scheduled medications per ordering MD. Monitor Q15 minute checks for safety.   R: Patient receptive. Denies SI/HI/AVH. Patient remains safe on the unit.

## 2013-08-26 MED ORDER — LORAZEPAM 0.5 MG PO TABS: 0.5000 mg | ORAL_TABLET | Freq: Two times a day (BID) | ORAL | Status: DC | PRN

## 2013-08-26 NOTE — Progress Notes (Signed)
D:  Patient's self inventory sheet, patient sleeps well, good appetite, low energy level, good attention span.  Rated depression and anxiety 1, denied hopeless.  Denied withdrawals.  Denied SI.  Has experienced pain and blurred vision.  Pain goal today 2, worst pain 5.  Plans to take medication, go to therapy, talk to husband after discharge.  Does have discharge plans.  No problems taking meds after discharge. A:  Medications administered per MD orders.  Emotional support and encouragement given patient. R:  Denied SI and HI.  Contracts for safety.  Denied A/V hallucinations.  Will continue to monitor patient for safety with 15 minute checks.  Safety maintained. Pt stated she has had some lower back pain from sleeping on different mattress, refused pain medication this morning.

## 2013-08-26 NOTE — Progress Notes (Signed)
Recreation Therapy Notes   Date: 01.29.2015 Time: 2:45pm Location: [address] Dayroom   Group Topic: Leisure Education  Goal Area(s) Addresses:  Patient will identify positive leisure activities.  Patient will identify one positive benefit of participation in leisure activities.   Behavioral Response: Engaged, Attentive, Appropriate   Intervention: Game  Activity: Leisure ABC's. Patients were asked to identify a leisure activity to correspond with each letter of the alphabet. Group discussion focused on the use of leisure as a coping mechanism, as well as a way to build support system post d/c.   Education:  Leisure Education, Building control surveyorDischarge Planning, Coping Skills   Education Outcome: Acknowledges understanding  Clinical Observations/Feedback: Patient actively engaged in developing group list. Patient contributed to group discussion, highlighting correlation between leisure participation and overall reduction of stress level.    [name], LRT/CTRS  [name] L 08/26/2013 5:14 PM

## 2013-08-26 NOTE — Progress Notes (Signed)
Adult Psychoeducational Group Note  Date:  08/26/2013 Time:  1:36 PM  Group Topic/Focus:  Stages of Change:   The focus of this group is to explain the stages of change and help patients identify changes they want to make upon discharge.  Participation Level:  Active  Participation Quality:  Appropriate  Affect:  Appropriate  Cognitive:  Appropriate  Insight: Appropriate  Engagement in Group:  Engaged  Modes of Intervention:  Discussion  Additional Comments:  Pt. Participated in group. Pt enjoys reading a book  To deal and cope with changes   Tonita CongMcLaurin, Waddell Iten L 08/26/2013, 1:36 PM

## 2013-08-26 NOTE — [facility] Suicide Risk Assessment (Signed)
[facility] INPATIENT:  Family/Significant Other Suicide Prevention Education  Suicide Prevention Education:  Education Completed; [name], Husband, [phone]; has been identified by the patient as the family member/significant other with whom the patient will be residing, and identified as the person(s) who will aid the patient in the event of a mental health crisis (suicidal ideations/suicide attempt).  With written consent from the patient, the family member/significant other has been provided the following suicide prevention education, prior to the and/or following the discharge of the patient.  The suicide prevention education provided includes the following:  Suicide risk factors  Suicide prevention and interventions  National Suicide Hotline telephone number  [facility] assessment telephone number  [facility] Assistance 911  [facility] and/or Residential Mobile Crisis Unit telephone number  Request made of family/significant other to:  Remove weapons (e.g., guns, rifles, knives), all items previously/currently identified as safety concern.  Husband advised guns have been secured.  Remove drugs/medications (over-the-counter, prescriptions, illicit drugs), all items previously/currently identified as a safety concern.  The family member/significant other verbalizes understanding of the suicide prevention education information provided.  The family member/significant other agrees to remove the items of safety concern listed above.  [name] 08/26/2013, 11:12 AM

## 2013-08-26 NOTE — Progress Notes (Signed)
D: Pt denies SI/HI/AVH. Pt is pleasant and cooperative. Pt said she had a good day, the groups are good, and she got to talk to her husband about some issues that they have been having. Pt feels good that the medications feel like they are working.  A: Pt was offered support and encouragement. Pt was given scheduled medications. Pt was encourage to attend groups. Q 15 minute checks were done for safety.   R:Pt attends groups and interacts well with peers and staff. Pt is taking medication. Pt has no complaints at this timm.Pt receptive to treatment and safety maintained on unit.

## 2013-08-26 NOTE — Progress Notes (Signed)
Adult Psychoeducational Group Note  Date:  08/26/2013 Time:  2000  Group Topic/Focus:  Wrap-Up Group:   The focus of this group is to help patients review their daily goal of treatment and discuss progress on daily workbooks.  Participation Level:  Minimal  Participation Quality:  Appropriate  Affect:  Flat  Cognitive:  Appropriate  Insight: Good  Engagement in Group:  Limited  Modes of Intervention:  Discussion and Support  Additional Comments:  Pt shared that her therapeutic leisure activities included reading, watching Netflix, and looking through the [employer] catalog.   Humberto SealsWhitaker, Peretz Thieme Monique 08/26/2013, 10:12 PM

## 2013-08-26 NOTE — Progress Notes (Signed)
The focus of this group is to educate the patient on the purpose and policies of crisis stabilization and provide a format to answer questions about their admission.  The group details unit policies and expectations of patients while admitted.  Patient attended 0900 nurse education orientation group this morning.  Patient actively participated, appropriate affect, alert, appropriate insight and engagement.  Today patient will work on 3 goals for discharge.  
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
## 2013-08-26 NOTE — [facility] Group Notes (Signed)
[facility] LCSW Group Therapy  [facility] [facility] [place] 1:15 - 2:30 PM  08/26/2013   Type of Therapy:  Group Therapy  Participation Level: Active  Participation Quality:  Attentive  Affect:  Appropriate  Cognitive:  Appropriate  Insight:  Developing/Improving and Engaged  Engagement in Therapy:  Developing/Improving Engaged  Modes of Intervention:  Discussion, Education, Exploration, Problem-Solving, Rapport Building, Support   Summary of Progress/Problems:  Patient listened attentively to speaker from [employer]. Patient shared she would be [facility].  Wynn BankerHodnett, Jeston Junkins Hairston 08/26/2013

## 2013-08-27 MED ORDER — TRAZODONE 25 MG HALF TABLET
25.0000 mg | ORAL_TABLET | Freq: Every evening | ORAL | Status: DC | PRN
  Administered 2013-08-27 – 2013-08-29 (×5): 25 mg via ORAL
  Filled 2013-08-27 (×10): qty 1

## 2013-08-27 NOTE — Progress Notes (Signed)
D: Pt denies SI/HI/AVH. Pt is pleasant and cooperative. Pt plans to have marriage  counseling with her pastor when she leaves. Pt says everything going good.     A: Pt was offered support and encouragement. Pt was given scheduled medications. Pt was encourage to attend groups. Q 15 minute checks were done for safety.   R:Pt attends groups and interacts well with peers and staff. Pt is taking medication. Pt has no complaints at this time.Pt receptive to treatment and safety maintained on unit.

## 2013-08-27 NOTE — [facility] Group Notes (Signed)
[facility] LCSW Group Therapy  Feelings Around Relapse 1:15 -2:30        08/27/2013   Type of Therapy:  Group Therapy  Participation Level:  Appropriate  Participation Quality:  Appropriate  Affect:  Appropriate  Cognitive:  Attentive Appropriate  Insight:  Developing/Improving  Engagement in Therapy: Developing/Improving  Modes of Intervention:  Discussion Exploration Problem-Solving Supportive  Summary of Progress/Problems:  The topic for today was feelings around relapse.    Patient processed feelings toward relapse and was able to relate to peers. Patient shared relapsing for her would be holding things in rather than talking with husband/family.  Patient identified coping skills that can be used to prevent a relapse.   Wynn BankerHodnett, Alleene Stoy Hairston 08/27/2013

## 2013-08-27 NOTE — Tx Team (Signed)
Interdisciplinary Treatment Plan Update   Date Reviewed:  08/27/2013  Time Reviewed:  8:38 AM  Progress in Treatment:   Attending groups: Yes Participating in groups: Yes Taking medication as prescribed: Yes  Tolerating medication: Yes Family/Significant other contact made:  Yes, collateral contact made with husband. Patient understands diagnosis: Yes  Discussing patient identified problems/goals with staff: Yes Medical problems stabilized or resolved: Yes Denies suicidal/homicidal ideation: Yes Patient has not harmed self or others: Yes  For review of initial/current patient goals, please see plan of care.  Estimated Length of Stay:    Reasons for Continued Hospitalization:  Anxiety Depression Medication stabilization  New Problems/Goals identified:    Discharge Plan or Barriers:   Home with outpatient follow up with Dr. Jennelle Humanottle and Sharlette DenseMickey Dew  Additional Comments:     Attendees:  Patient:  08/27/2013 8:38 AM   Signature: Mervyn GayJ. Jonnalagadda, MD 08/27/2013 8:38 AM  Signature:   08/27/2013 8:38 AM  Signature: Silverio DecampJamison Lloyd, NP 08/27/2013 8:38 AM  Signature:  Waynetta SandyJan Wright,  RN 08/27/2013 8:38 AM  Signature:  Nestor Ramproy Duncan,  08/27/2013 8:38 AM  Signature:  Juline PatchQuylle Leeum Sankey, LCSW 08/27/2013 8:38 AM  Signature:  Reyes Ivanhelsea Horton, LCSW 08/27/2013 8:38 AM  Signature:  Leisa LenzValerie Enoch, Care Coordinator 08/27/2013 8:38 AM  Signature:  08/27/2013 8:38 AM  Signature:  08/27/2013  8:38 AM  Signature:   Onnie BoerJennifer Clark, RN [facility] 08/27/2013  8:38 AM  Signature:  Harold Barbanonecia Byrd, RN 08/27/2013  8:38 AM    Scribe for Treatment Team:   Juline PatchQuylle Braleigh Massoud,  08/27/2013 8:38 AM

## 2013-08-27 NOTE — Progress Notes (Signed)
Patient ID: [name], female   DOB: 09-Jun-1971, 43 y.o.   MRN: [id number] Patient ID: [name], female   DOB: 09-Jun-1971, 43 y.o.   MRN: [id number] [facility]  08/27/2013 11:54 AM Lisa SierrasMelissa L Molina  MRN:  [id number] Subjective:  Patient has been suffering with major depressive disorder with suicidal ideation and generalized anxiety disorder.   Patient complains occasional dizziness and morning drowsiness. Spoke with the patient husband who requested to communicate regarding her progress. Patient continued to be adjusting medication which might be cause for her symptoms. She has been compliant with her new medication regimen without significant adverse effects and this is able to tolerate without much difficulty. Patient is  feeling better and less depressed depressed, anxious and has no suicidal thoughts. Patient Has been taking Abilify so we'll monitor  side effects including dystonic reactions. She has stated that sleeping and eating better now. Patient husband has been visiting her regularly and she is hoping her dad so maybe she per today.    Diagnosis:   DSM5: Schizophrenia Disorders:   Obsessive-Compulsive Disorders:   Trauma-Stressor Disorders:   Substance/Addictive Disorders:   Depressive Disorders:  Major Depressive Disorder - Severe (296.23)  Axis I: Generalized Anxiety Disorder and Major Depression, Recurrent severe  ADL's:  Intact  Sleep: Good  Appetite:  Good  Suicidal Ideation:  Has suicidal ideation and plan but contract for safety while in hospital Homicidal Ideation:  denied AEB (as evidenced by):  Psychiatric Specialty Exam: Review of Systems  HENT: Positive for congestion.   Eyes: Positive for blurred vision.  Respiratory: Negative.   Cardiovascular: Negative.   Gastrointestinal: Negative.   Genitourinary: Negative.   Musculoskeletal: Positive for myalgias.  Skin: Negative.   Neurological: Positive for tingling, weakness and  headaches.  Endo/Heme/Allergies: Negative.   Psychiatric/Behavioral: Positive for depression. The patient is nervous/anxious.     Blood pressure 111/66, pulse 89, temperature 98.6 F (37 C), temperature source Oral, resp. rate 16, height 5' 1.75" (1.568 m), weight 73.029 kg (161 lb).Body mass index is 29.7 kg/(m^2).  General Appearance: Fairly Groomed  Patent attorneyye Contact::  Good  Speech:  Clear and Coherent and Slow  Volume:  Decreased  Mood:  Anxious, Depressed, Hopeless and Worthless  Affect:  Congruent, Depressed and Flat  Thought Process:  Goal Directed and Intact  Orientation:  Full (Time, Place, and Person)  Thought Content:  Rumination  Suicidal Thoughts:  Yes.  with intent/plan  Homicidal Thoughts:  No  Memory:  Immediate;   Fair  Judgement:  Fair  Insight:  Fair  Psychomotor Activity:  Psychomotor Retardation and Restlessness  Concentration:  Fair  Recall:  NA  Akathisia:  NA  Handed:  Right  AIMS (if indicated):     Assets:  Communication Skills Desire for Improvement Financial Resources/Insurance Housing Physical Health Resilience Social Support Transportation  Sleep:  Number of Hours: 6.25   Current Medications: Current Facility-Administered Medications  Medication Dose Route Frequency Provider Last Rate Last Dose  . acetaminophen (TYLENOL) tablet 650 mg  650 mg Oral Q6H PRN Kerry HoughSpencer E Simon, PA-C   650 mg at 08/24/13 [id number]  . alum & mag hydroxide-simeth (MAALOX/MYLANTA) 200-200-20 MG/5ML suspension 30 mL  30 mL Oral Q4H PRN Kerry HoughSpencer E Simon, PA-C      . ARIPiprazole (ABILIFY) tablet 1 mg  1 mg Oral QHS Nehemiah SettleJanardhaha R Charle Mclaurin, MD   1 mg at 08/26/13 2154  . buPROPion [facility](WELLBUTRIN SR) 12 hr tablet 100 mg  100 mg Oral  Daily Nehemiah Settle, MD   100 mg at 08/27/13 0749  . busPIRone (BUSPAR) tablet 7.5 mg  7.5 mg Oral BID Nehemiah Settle, MD   7.5 mg at 08/27/13 0748  . gabapentin (NEURONTIN) tablet 1,200 mg  1,200 mg Oral QHS Kerry Hough, PA-C    1,200 mg at 08/26/13 2154  . LORazepam (ATIVAN) tablet 0.5 mg  0.5 mg Oral BID PRN Nehemiah Settle, MD      . magnesium hydroxide (MILK OF MAGNESIA) suspension 30 mL  30 mL Oral Daily PRN Kerry Hough, PA-C      . SUMAtriptan (IMITREX) tablet 50 mg  50 mg Oral Q2H PRN Nehemiah Settle, MD   50 mg at 08/24/13 1331  . traZODone (DESYREL) tablet 50 mg  50 mg Oral QHS,MR X 1 Kerry Hough, PA-C   50 mg at 08/26/13 2154  . verapamil (CALAN-SR) CR tablet 240 mg  240 mg Oral Daily Kerry Hough, PA-C   240 mg at 08/27/13 0981    Lab Results:  No results found for this or any previous visit (from the past 48 hour(s)).  Physical Findings: AIMS: Facial and Oral Movements Muscles of Facial Expression: None, normal Lips and Perioral Area: None, normal Jaw: None, normal Tongue: None, normal,Extremity Movements Upper (arms, wrists, hands, fingers): None, normal Lower (legs, knees, ankles, toes): None, normal, Trunk Movements Neck, shoulders, hips: None, normal, Overall Severity Severity of abnormal movements (highest score from questions above): None, normal Incapacitation due to abnormal movements: None, normal Patient's awareness of abnormal movements (rate only patient's report): No Awareness, Dental Status Current problems with teeth and/or dentures?: No Does patient usually wear dentures?: No  CIWA:  CIWA-Ar Total: 2 COWS:  COWS Total Score: 2  Treatment Plan Summary: Daily contact with patient to assess and evaluate symptoms and progress in treatment Medication management for depression, anxiety, suicidal thoughts and we will check him blurred vision.   Plan: Treatment Plan/Recommendations:  1.  Continue  for crisis management and stabilization. 2. Medication management to reduce current symptoms to base line and improve the patient's overall level of functioning.  Continue her current medications regimen Abilify 1 mg at bedtime, run between SER 100 mg daily,  buspar 7.5 mg twice daily, Neurontin 400 mg at bedtime, verapamil 240 mg daily. Change trazodone to 50 mg half tablet at bedtime if needed for insomnia  3. Treat health problems as indicated. 4. Develop treatment plan to decrease risk of relapse upon discharge and to reduce the need for readmission. 5. Psycho-social education regarding relapse prevention and self care. 6. Health care follow up as needed for medical problems.  7. Restart home medications where appropriate. 8. Disposition plans are in progress may be discharged on Monday if she continued to show clinical improvement and contracts for safety.   Medical Decision Making Problem Points:  Established problem, worsening (2), New problem, with no additional work-up planned (3), Review of last therapy session (1) and Review of psycho-social stressors (1) Data Points:  Review or order clinical lab tests (1) Review or order medicine tests (1) Review of medication regiment & side effects (2) Review of new medications or change in dosage (2)  I certify that inpatient services furnished can reasonably be expected to improve the patient's condition.   Shaquill Iseman,JANARDHAHA R. 08/27/2013, 11:54 AM

## 2013-08-27 NOTE — [facility] Group Notes (Signed)
[facility]   08/27/2013 9:46 AM    Participation Quality:  Appropraite  Mood/Affect:  Appropriate  Depression Rating:  1  Anxiety Rating:  1  Thoughts of Suicide:  No  Will you contract for safety?   NA  Current AVH:  No  Plan for Discharge/Comments:  Patient attended discharge planning group and actively participated in group.  She reports doing well and hopes to discharge home soon.  She will follow up with Dr. Jennelle Humanottle and Sharlette DenseMickey Dew.  CSW provided all participants with daily workbook.   Transportation Means: Patient has transportation.   Supports:  Patient has a support system.   Shani Fitch, Joesph JulyQuylle Hairston

## 2013-08-27 NOTE — Progress Notes (Signed)
Recreation Therapy Notes  Date: 01.30.2015 Time: 2:45pm Location: [address] Dayroom   Group Topic: Communication, Team Building, Problem Solving  Goal Area(s) Addresses:  Patient will effectively work with peer towards shared goal.  Patient will identify skill used to make activity successful.  Patient will identify how skills used during activity can be used to reach post d/c goals.   Behavioral Response: Engaged, Attentive, Appropriate   Intervention: Problem Solving Activitiy  Activity: Life Boat. Patients were given a scenario about being on a sinking yacht. Patients were informed the yacht included 15 guest, 8 of which could be placed on the life boat, along with all group members. Individuals on guest list were of varying socioeconomic classes such as a Education officer, museumriest, Materials engineerresident Obama, MidwifeBus Driver, Tree surgeonTeacher and Chef.   Education: Pharmacist, communityocial Skills, Discharge Planning   Education Outcome: Acknowledges understanding  Clinical Observations/Feedback: Patient actively engaged in group activity, voicing her opinion and debating with peers appropriately. Patient contributed to group discussion, identifying qualities that guided her decision making, as well as relating group skills to building her support system post d/c and preventing relapse.    Marykay Lexenise L Brionne Mertz, LRT/CTRS  Adante Courington L 08/27/2013 5:13 PM

## 2013-08-27 NOTE — Progress Notes (Signed)
D: Patient presents with appropriate affect and mood. She reported on the self inventory sheet that she's sleeping well, good appetite and ability to pay attention and normal energy level. Patient rated depression and feelings of hopelessness "1". She's participating in groups and compliant with medications.  A: Support and encouragement provided to patient. Scheduled medications administered per MD orders. Maintain Q15 minute checks for safety.  R: Patient receptive. Denies SI/HI. Patient remains safe.

## 2013-08-27 NOTE — Progress Notes (Signed)
Adult Psychoeducational Group Note  Date:  08/27/2013 Time:  9:28 PM  Group Topic/Focus:  Wrap-Up Group:   The focus of this group is to help patients review their daily goal of treatment and discuss progress on daily workbooks.  Participation Level:  Active  Participation Quality:  Appropriate  Affect:  Appropriate  Cognitive:  Appropriate  Insight: Appropriate  Engagement in Group:  Engaged  Modes of Intervention:  Discussion  Additional Comments: The  Patient expressed that she learned in group how to identify triggers and relapse prevention.  Octavio Mannshigpen, Naesha Buckalew Lee 08/27/2013, 9:28 PM

## 2013-08-28 MED ORDER — VERAPAMIL HCL ER 120 MG PO TBCR
120.0000 mg | EXTENDED_RELEASE_TABLET | Freq: Every day | ORAL | Status: DC
  Administered 2013-08-29: 120 mg via ORAL
  Filled 2013-08-28 (×3): qty 1

## 2013-08-28 MED ORDER — VERAPAMIL HCL ER 120 MG PO TBCR
120.0000 mg | EXTENDED_RELEASE_TABLET | Freq: Every day | ORAL | Status: DC
Start: 2013-08-28 — End: 2013-08-28
  Filled 2013-08-28: qty 1

## 2013-08-28 NOTE — [facility] Group Notes (Signed)
[facility] Group Notes:  (Nursing/MHT/Case Management/Adjunct)  Date:  08/28/2013  Time:  10:00 AM  Type of Therapy:  Group Therapy  Participation Level:  Active  Participation Quality:  Appropriate  Affect:  Appropriate  Cognitive:  Alert  Insight:  Appropriate  Engagement in Group:  Engaged  Modes of Intervention:  Discussion  Summary of Progress/Problems:pt stated she was feeling very tired and sad today and then began to cry. She stated,"it is my daughter's birthday and I want to be home with her." She would like to work on better coping skills and realizes she does want to be around for her children.  Rodman KeyWebb, Ayrton Mcvay Dulaney Eye InstituteGuyes 08/28/2013, 10:00 AM

## 2013-08-28 NOTE — Progress Notes (Signed)
Adult Psychoeducational Group Note  Date:  08/28/2013 Time:  8:00 pm  Group Topic/Focus:  Wrap-Up Group:   The focus of this group is to help patients review their daily goal of treatment and discuss progress on daily workbooks.  Participation Level:  Active  Participation Quality:  Appropriate and Sharing  Affect:  Appropriate  Cognitive:  Appropriate  Insight: Appropriate  Engagement in Group:  Engaged  Modes of Intervention:  Discussion, Education, Socialization and Support  Additional Comments:  Pt stated that she has learned that she needs to make more time for herself when she get out of the hospital. Pt stated that she also needs to stick with her medication needs.   Crystin Lechtenberg 08/28/2013, 9:02 PM

## 2013-08-28 NOTE — Progress Notes (Addendum)
Pt husbadn came to visit over lunch. Pt stated she was feeling depressed today because it is her child's 15th bday. 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 She feels she has no time for herself. Pt rates her depression and hopelessness a 2/10. She would like to take her meds, see her therapist and meet with her pastor. Pt would like to go to wellness classes.She has been feeling lightheaded and does have a positive tilt. Pt was given a pitcher of gatorade to drink and encouraged to continue to drink po fluid, Pt does contract for safety and denies Si and HI-NP will change her verapmil to 12omg.

## 2013-08-28 NOTE — Progress Notes (Signed)
Writer has observed patient up in the dayroom watching tv and interacting appropriately with peers. She reports that her day has been good and her husband visited. She feels that her medications are working and has already scheduled appointments with her pastor, therapist and psychologist once discharged. She is compliant with medications, currently denies si/hi/a/v hallucinations. Safety maintained on unit with 15 min checks.

## 2013-08-28 NOTE — [facility] Group Notes (Signed)
[facility] Group Notes:  (Nursing/MHT/Case Management/Adjunct)  Date:  08/28/2013  Time:  10:59 AM  Type of Therapy:  Nurse Education  Participation Level:  Active  Participation Quality:  Appropriate  Affect:  Appropriate  Cognitive:  Alert  Insight:  Appropriate  Engagement in Group:  Engaged  Modes of Intervention:  Discussion  Summary of Progress/Problems:  [name] 08/28/2013, 10:59 AM 

## 2013-08-28 NOTE — Progress Notes (Addendum)
[facility] MD Progress Note  08/28/2013 1:23 PM [name]  MRN:  [id number] Subjective:  Patient seen and chart reviewed.  Sitting in bed with lunch, husband just left after a visit.  She is calm and cooperative.   Reorts feeling "dizzy and light headed".  She reports a recent increased in her Verapamil dose(utilized for HA prevention) and that her usual time of demonstration is at bedtime She denies SI/HI/hallucination, rates her depression at 1/10 and anxiety 0/10 Diagnosis:   DSM5:  Depressive Disorders:  Major Depressive Disorder - Severe (296.23) Total Time spent with patient: 30 minutes  Axis I: Anxiety Disorder NOS and Major Depression, Recurrent severe  ADL's:  Intact  Sleep: Fair  Appetite:  Fair  Suicidal Ideation: Denies  Homicidal Ideation:  Denies  AEB (as evidenced by):  Psychiatric Specialty Exam: Physical Exam  Constitutional: She is oriented to person, place, and time. She appears well-developed and well-nourished.  HENT:  Head: Normocephalic and atraumatic.  Neck: Normal range of motion. Neck supple.  Respiratory: Effort normal and breath sounds normal.  Genitourinary:  Deferred no complaints   Musculoskeletal: Normal range of motion.  Neurological: She is alert and oriented to person, place, and time.  Skin: Skin is warm and dry.    Review of Systems  Constitutional: Negative.   HENT: Negative.   Eyes: Negative.   Respiratory: Negative.   Cardiovascular: Negative.   Gastrointestinal: Negative.   Genitourinary: Negative.   Musculoskeletal: Negative.   Skin: Negative.   Neurological: Positive for dizziness.  Endo/Heme/Allergies: Negative.   Psychiatric/Behavioral: Positive for depression. Negative for suicidal ideas, hallucinations and substance abuse. The patient has insomnia. The patient is not nervous/anxious.     Blood pressure 125/84, pulse 93, temperature 98.2 F (36.8 C), temperature source Oral, resp. rate 20, height 5' 1.75" (1.568  m), weight 73.029 kg (161 lb).Body mass index is 29.7 kg/(m^2).  General Appearance: Casual  Eye Contact::  Good  Speech:  Clear and Coherent  Volume:  Normal  Mood:  Euthymic  Affect:  Congruent  Thought Process:  Goal Directed  Orientation:  Full (Time, Place, and Person)  Thought Content:  Negative  Suicidal Thoughts:  No  Homicidal Thoughts:  No  Memory:  Immediate;   Good Recent;   Good Remote;   Good  Judgement:  Fair  Insight:  Fair  Psychomotor Activity:  Normal  Concentration:  Good  Recall:  Good  Fund of Knowledge:Good  Language: Good  Akathisia:  No  Handed:  Right  AIMS (if indicated):     Assets:  Communication Skills Desire for Improvement Resilience Social Support  Sleep:  Number of Hours: 5.25   Musculoskeletal: Strength & Muscle Tone: within normal limits Gait & Station: normal Patient leans: N/A  Current Medications: Current Facility-Administered Medications  Medication Dose Route Frequency Provider Last Rate Last Dose  . acetaminophen (TYLENOL) tablet 650 mg  650 mg Oral Q6H PRN Kerry Hough, PA-C   650 mg at 08/27/13 2153  . alum & mag hydroxide-simeth (MAALOX/MYLANTA) 200-200-20 MG/5ML suspension 30 mL  30 mL Oral Q4H PRN Kerry Hough, PA-C      . ARIPiprazole (ABILIFY) tablet 1 mg  1 mg Oral QHS Nehemiah Settle, MD   1 mg at 08/27/13 2154  . buPROPion [facility] SR) 12 hr tablet 100 mg  100 mg Oral Daily Nehemiah Settle, MD   100 mg at 08/28/13 0840  . busPIRone (BUSPAR) tablet 7.5 mg  7.5 mg Oral BID  Nehemiah SettleJanardhaha R Jonnalagadda, MD   7.5 mg at 08/28/13 0840  . gabapentin (NEURONTIN) tablet 1,200 mg  1,200 mg Oral QHS Kerry HoughSpencer E Simon, PA-C   1,200 mg at 08/27/13 2153  . LORazepam (ATIVAN) tablet 0.5 mg  0.5 mg Oral BID PRN Nehemiah SettleJanardhaha R Jonnalagadda, MD      . magnesium hydroxide (MILK OF MAGNESIA) suspension 30 mL  30 mL Oral Daily PRN Kerry HoughSpencer E Simon, PA-C      . SUMAtriptan (IMITREX) tablet 50 mg  50 mg Oral Q2H PRN  Nehemiah SettleJanardhaha R Jonnalagadda, MD   50 mg at 08/24/13 1331  . traZODone (DESYREL) tablet 25 mg  25 mg Oral QHS,MR X 1 Nehemiah SettleJanardhaha R Jonnalagadda, MD   25 mg at 08/27/13 2153  . verapamil (CALAN-SR) CR tablet 240 mg  240 mg Oral Daily Kerry HoughSpencer E Simon, PA-C   240 mg at 08/28/13 [id number]    Lab Results: No results found for this or any previous visit (from the past 48 hour(s)).  Physical Findings: AIMS: Facial and Oral Movements Muscles of Facial Expression: None, normal Lips and Perioral Area: None, normal Jaw: None, normal Tongue: None, normal,Extremity Movements Upper (arms, wrists, hands, fingers): None, normal Lower (legs, knees, ankles, toes): None, normal, Trunk Movements Neck, shoulders, hips: None, normal, Overall Severity Severity of abnormal movements (highest score from questions above): None, normal Incapacitation due to abnormal movements: None, normal Patient's awareness of abnormal movements (rate only patient's report): No Awareness, Dental Status Current problems with teeth and/or dentures?: No Does patient usually wear dentures?: No  CIWA:  CIWA-Ar Total: 2 COWS:  COWS Total Score: 2  Treatment Plan Summary:   Review of chart, vital signs, medications and notes Daily contact with the patient to assess and evaluate synmptoms and progress in treatment    Plan: 1. Continue crisis management and stabilization. Estimated length of stay 5-7 days past his current stay of 1 2.  Medication management to reduce current symptoms to base line and improve patient's overall level of functioning Medications reviewed with the pateint and no untoward effects -Continue  Abilify 1 mg qhs                  Wellbutrin ER 100 mg daily                   Buspar 7.5 mg bid bid                   Nerontin 1200 mg q hs Decrease Verapamil to 120 mg daily at bedtime and monitor for side effects  Individual and group therapy encouraged Coping skills for depression, substance abuse, and anxiety  3.   Treat health problems as indicated. 4.  Develop treatment plan to decrease risk of relapse upon discharge and the need for readmission 5.  Psych-social education regarding relapse prevention and self care. 6.  Health care follow up as needed for medical problems 7.  Continue home medications where appropriate 8.  Disposition in progress    Plan:  Medical Decision Making Problem Points:  Established problem, stable/improving (1) and Review of psycho-social stressors (1) Data Points:  Review or order clinical lab tests (1) Review or order medicine tests (1) Review of medication regiment & side effects (2)  I certify that inpatient services furnished can reasonably be expected to improve the patient's condition.   Lorinda CreedLARACH, MARY PMHNP 08/28/2013, 1:23 PM I agreed with findings and treatment plan of this patient

## 2013-08-28 NOTE — [facility] Group Notes (Signed)
[facility] Group Notes: (Clinical Social Work)   08/28/2013      Type of Therapy:  Group Therapy   Participation Level:  Did Not Attend    Lisa MantleMareida Grossman-Orr, LCSW 08/28/2013, 4:35 PM

## 2013-08-28 NOTE — Progress Notes (Signed)
Date: 08/28/2013  Time: 3:58 PM  Group Topic/Focus:  Emotional Education: The focus of this group is to discuss what feelings/emotions are, and how they are experienced.  Participation Level: Did Not Attend  Merleen MillinerCataldo, Clance Baquero Y  08/28/2013, 3:58 PM

## 2013-08-29 NOTE — [facility] Group Notes (Signed)
[facility] Group Notes:  (Nursing/MHT/Case Management/Adjunct)  Date:  08/29/2013  Time:  2:03 PM  Type of Therapy:  Psychoeducational Skills  Participation Level:  Active  Participation Quality:  Appropriate  Affect:  Appropriate  Cognitive:  Appropriate  Insight:  Appropriate  Engagement in Group:  Engaged  Modes of Intervention:  Discussion, Education and Exploration  Summary of Progress/Problems: focus of this group was on identifying healthy support systems to aid in recovery with mental illness.   Malva LimesStrader, Nikiah Goin 08/29/2013, 2:03 PM

## 2013-08-29 NOTE — Progress Notes (Signed)
Adult Psychoeducational Group Note  Date:  08/29/2013 Time:  3:32 PM  Group Topic/Focus:  Making Healthy Choices:   The focus of this group is to help patients identify negative/unhealthy choices they were using prior to admission and identify positive/healthier coping strategies to replace them upon discharge.  Participation Level:  Did Not Attend  Elijio MilesMercer, Alvah Lagrow N 08/29/2013, 3:32 PM

## 2013-08-29 NOTE — Progress Notes (Addendum)
Patient ID: Lisa SierrasMelissa L Mitchner, female   DOB: 02/02/1971, [age] y.o.   MRN: [id number] D: Pt. Visible on unit in dayroom, interacting and watching TV. Pt. Reports "suppose to go home tomorrow" Pt. Appears excited. Pt. Report what brought her to the hospital "I didn't attempt suicide, but I was planning suicide" Pt reports being here has been helpful "being able to focus on myself" "most helpful being around people who understand about depression" A: Writer introduced self to client and encouraged her to move ahead by using coping skill learned in group and continue medication as scheduled. Staff will monitor q8315min for safety. R: Pt. Is safe on the unit.

## 2013-08-29 NOTE — [facility] Group Notes (Signed)
[facility] Group Notes:  (Clinical Social Work)  08/29/2013   1:15-2:15PM  Summary of Progress/Problems:  The main focus of today's process group was to   identify the patient's current support system and decide on other supports that can be put in place.  The picture on workbook was used to discuss why additional supports are needed.  An emphasis was placed on using counselor, doctor, therapy groups, 12-step groups, and problem-specific support groups to expand supports.   There was also an extensive discussion about what constitutes a healthy support versus an unhealthy support.  The patient expressed full comprehension of the concepts presented, and agreed that there is a need to add more supports.  The patient stated the current supports in place are fairly good, including her doctor.  She also discussed the unhealthy ways in which her mother currently treats her, ongoing from childhood.  One additional support would be a support group and she is interested in finding this.  Type of Therapy:  Process Group  Participation Level:  Active  Participation Quality:  Attentive and Sharing  Affect:  Blunted  Cognitive:  Appropriate and Oriented  Insight:  Engaged  Engagement in Therapy:  Engaged  Modes of Intervention:  Education,  Support and ConAg[employer] FoodsProcessing  Danitra Payano Grossman-Orr, LCSW 08/29/2013, 4:00pm

## 2013-08-29 NOTE — Progress Notes (Signed)
Patient ID: [name], female   DOB: April 01, 1971, 43 y.o.   MRN: [id number] [facility] Note  08/29/2013 12:39 PM [name]  MRN:  [id number] Subjective:  Patient seen and chart reviewed.    She is calm and cooperative.  Dizziness has improved over the last 24 hrs.  She tells me she feels "good today" and is looking forward to anticipated dc in morning She denies SI/HI/hallucination, rates her depression at 1/10 and anxiety 0/10 Her and her husband plan to seek support from their church pastor  Diagnosis:   DSM5:  Depressive Disorders:  Major Depressive Disorder - Severe (296.23) Total Time spent with patient: 30 minutes  Axis I: Anxiety Disorder NOS and Major Depression, Recurrent severe  ADL's:  Intact  Sleep: Good  Appetite:  Good  Suicidal Ideation: Denies  Homicidal Ideation:  Denies  AEB (as evidenced by):  Psychiatric Specialty Exam: Physical Exam  Constitutional: She is oriented to person, place, and time. She appears well-developed and well-nourished.  HENT:  Head: Normocephalic and atraumatic.  Neck: Normal range of motion. Neck supple.  Respiratory: Effort normal and breath sounds normal.  Genitourinary:  Deferred no complaints   Musculoskeletal: Normal range of motion.  Neurological: She is alert and oriented to person, place, and time.  Skin: Skin is warm and dry.    Review of Systems  Constitutional: Negative.   HENT: Negative.   Eyes: Negative.   Respiratory: Negative.   Cardiovascular: Negative.   Gastrointestinal: Negative.   Genitourinary: Negative.   Musculoskeletal: Negative.   Skin: Negative.   Neurological: Positive for dizziness.  Endo/Heme/Allergies: Negative.   Psychiatric/Behavioral: Positive for depression. Negative for suicidal ideas, hallucinations and substance abuse. The patient is not nervous/anxious and does not have insomnia.     Blood pressure 117/83, pulse 96, temperature 98.5 F (36.9 C), temperature  source Oral, resp. rate 15, height 5' 1.75" (1.568 m), weight 73.029 kg (161 lb).Body mass index is 29.7 kg/(m^2).  General Appearance: Casual  Eye Contact::  Good  Speech:  Clear and Coherent  Volume:  Normal  Mood:  Euthymic  Affect:  Congruent  Thought Process:  Goal Directed  Orientation:  Full (Time, Place, and Person)  Thought Content:  Negative  Suicidal Thoughts:  No  Homicidal Thoughts:  No  Memory:  Immediate;   Good Recent;   Good Remote;   Good  Judgement:  Fair  Insight:  Fair  Psychomotor Activity:  Normal  Concentration:  Good  Recall:  Good  Fund of Knowledge:Good  Language: Good  Akathisia:  No  Handed:  Right  AIMS (if indicated):     Assets:  Communication Skills Desire for Improvement Resilience Social Support  Sleep:  Number of Hours: 5.5   Musculoskeletal: Strength & Muscle Tone: within normal limits Gait & Station: normal Patient leans: N/A  Current Medications: Current Facility-Administered Medications  Medication Dose Route Frequency Provider Last Rate Last Dose  . acetaminophen (TYLENOL) tablet 650 mg  650 mg Oral Q6H PRN Kerry HoughSpencer E Simon, PA-C   650 mg at 08/27/13 2153  . alum & mag hydroxide-simeth (MAALOX/MYLANTA) 200-200-20 MG/5ML suspension 30 mL  30 mL Oral Q4H PRN Kerry HoughSpencer E Simon, PA-C      . ARIPiprazole (ABILIFY) tablet 1 mg  1 mg Oral QHS Nehemiah SettleJanardhaha R Jonnalagadda, MD   1 mg at 08/28/13 2140  . buPROPion (WELLBUTRIN SR) 12 hr tablet 100 mg  100 mg Oral Daily Nehemiah SettleJanardhaha R Jonnalagadda, MD   100 mg  at 08/29/13 0834  . busPIRone (BUSPAR) tablet 7.5 mg  7.5 mg Oral BID Nehemiah Settle, MD   7.5 mg at 08/29/13 0834  . gabapentin (NEURONTIN) tablet 1,200 mg  1,200 mg Oral QHS Kerry Hough, PA-C   1,200 mg at 08/28/13 2140  . LORazepam (ATIVAN) tablet 0.5 mg  0.5 mg Oral BID PRN Nehemiah Settle, MD      . magnesium hydroxide (MILK OF MAGNESIA) suspension 30 mL  30 mL Oral Daily PRN Kerry Hough, PA-C      .  SUMAtriptan (IMITREX) tablet 50 mg  50 mg Oral Q2H PRN Nehemiah Settle, MD   50 mg at 08/24/13 1331  . traZODone (DESYREL) tablet 25 mg  25 mg Oral QHS,MR X 1 Nehemiah Settle, MD   25 mg at 08/29/13 0027  . verapamil (CALAN-SR) CR tablet 120 mg  120 mg Oral QHS Canary Brim, NP        Lab Results: No results found for this or any previous visit (from the past 48 hour(s)).  Physical Findings: AIMS: Facial and Oral Movements Muscles of Facial Expression: None, normal Lips and Perioral Area: None, normal Jaw: None, normal Tongue: None, normal,Extremity Movements Upper (arms, wrists, hands, fingers): None, normal Lower (legs, knees, ankles, toes): None, normal, Trunk Movements Neck, shoulders, hips: None, normal, Overall Severity Severity of abnormal movements (highest score from questions above): None, normal Incapacitation due to abnormal movements: None, normal Patient's awareness of abnormal movements (rate only patient's report): No Awareness, Dental Status Current problems with teeth and/or dentures?: No Does patient usually wear dentures?: No  CIWA:  CIWA-Ar Total: 2 COWS:  COWS Total Score: 2  Treatment Plan Summary:   Review of chart, vital signs, medications and notes Daily contact with the patient to assess and evaluate synmptoms and progress in treatment    Plan: 1. Continue crisis management and stabilization. Estimated length of stay 5-7 days past his current stay of 1 2.  Medication management to reduce current symptoms to base line and improve patient's overall level of functioning Medications reviewed with the pateint and no untoward effects -Continue  Abilify 1 mg qhs                  Wellbutrin ER 100 mg daily                   Buspar 7.5 mg bid bid                   Nerontin 1200 mg q hs Decrease Verapamil to 120 mg daily at bedtime and monitor for side effects, dizziness  3.  Treat health problems as indicated. 4.  Develop treatment plan  to decrease risk of relapse upon discharge and the need for readmission      Individual and group therapy encouraged     Coping skills for depression, substance abuse, and anxiety 5.  Psych-social education regarding relapse prevention and self care. 6.  Health care follow up as needed for medical problems 7.  Continue home medications where appropriate 8.  Disposition in progress    Plan:  Medical Decision Making Problem Points:  Established problem, stable/improving (1) and Review of psycho-social stressors (1) Data Points:  Review or order clinical lab tests (1) Review or order medicine tests (1) Review of medication regiment & side effects (2)  I certify that inpatient services furnished can reasonably be expected to improve the patient's condition.   LARACH,  MARY PMHNP 08/29/2013, 12:39 PM I agreed with findings and treatment plan of this patient

## 2013-08-30 MED ORDER — ARIPIPRAZOLE 2 MG PO TABS: 1.0000 mg | ORAL_TABLET | Freq: Every day | ORAL | Status: DC

## 2013-08-30 MED ORDER — BUSPIRONE HCL 7.5 MG PO TABS: 7.5000 mg | ORAL_TABLET | Freq: Two times a day (BID) | ORAL | Status: DC

## 2013-08-30 MED ORDER — LORAZEPAM 0.5 MG PO TABS: 0.5000 mg | ORAL_TABLET | Freq: Two times a day (BID) | ORAL | Status: DC | PRN

## 2013-08-30 MED ORDER — SUMATRIPTAN SUCCINATE 50 MG PO TABS: 50.0000 mg | ORAL_TABLET | ORAL | Status: DC | PRN

## 2013-08-30 MED ORDER — BUPROPION HCL ER (SR) 100 MG PO TB12: 100.0000 mg | ORAL_TABLET | Freq: Every day | ORAL | Status: DC

## 2013-08-30 MED ORDER — TRAZODONE 25 MG HALF TABLET: 25.0000 mg | ORAL_TABLET | Freq: Every evening | ORAL | Status: DC | PRN

## 2013-08-30 NOTE — Progress Notes (Signed)
[facility] :  Will you be returning to the same living situation after discharge: Yes,  Patient returning home with family. At discharge, do you have transportation home?:Yes,  Patient to arrange transportation home. Do you have the ability to pay for your medications:Yes,  Patient is able to afford medications.  Release of information consent forms completed and in the chart;  Patient's signature needed at discharge.  Patient to Follow up at: Follow-up Information   Follow up with Dr. Jennelle Humanottle - [facility] On 09/08/2013. (You are scheduled with Dr. Jennelle Humanottle on Wednesday, September 08, 2013 at 3:00 PM)    Contact information:   [address] [place], [place]   [address]  [phone]      Follow up with Sharlette DenseMickey Dew - [facility] On 09/03/2013. (You are scheduled with Sharlette DenseMickey Dew on Friday, September 03, 2013 at [facility]9AM)    Contact information:   [address] [place], [place]   [address]  [phone]      Patient denies SI/HI:  Patient no longer endorsing SI/HI or other thoughts of self harm.   Safety Planning and Suicide Prevention discussed: .Reviewed with all patients during discharge planning group   [name], Joesph JulyQuylle Molina 08/30/2013, 12:34 PM

## 2013-08-30 NOTE — [facility] Suicide Risk Assessment (Signed)
   Demographic Factors:  Adolescent or young adult and Caucasian  Total Time spent with patient: 30 minutes  Psychiatric Specialty Exam: Physical Exam  Review of Systems  All other systems reviewed and are negative.    Blood pressure 120/82, pulse 93, temperature 98.5 F (36.9 C), temperature source Oral, resp. rate 18, height 5' 1.75" (1.568 m), weight 73.029 kg (161 lb).Body mass index is 29.7 kg/(m^2).  General Appearance: Casual  Eye Contact::  Good  Speech:  Clear and Coherent  Volume:  Normal  Mood:  Anxious  Affect:  Appropriate  Thought Process:  Goal Directed and Intact  Orientation:  Full (Time, Place, and Person)  Thought Content:  WDL  Suicidal Thoughts:  No  Homicidal Thoughts:  No  Memory:  Immediate;   Fair  Judgement:  Good  Insight:  Good  Psychomotor Activity:  Normal  Concentration:  Good  Recall:  Good  Fund of Knowledge:Good  Language: Good  Akathisia:  NA  Handed:  Right  AIMS (if indicated):     Assets:  Communication Skills Desire for Improvement Financial Resources/Insurance Housing Intimacy Physical Health Resilience Social Support Talents/Skills Transportation Vocational/Educational  Sleep:  Number of Hours: 6.5    Musculoskeletal: Strength & Muscle Tone: within normal limits Gait & Station: normal Patient leans: N/A   Mental Status Per Nursing Assessment::   On Admission:  Suicidal ideation indicated by patient;Suicide plan;Self-harm thoughts  Current Mental Status by Physician:  patient was calm and cooperative. Patient has normall psychomotor activity. Patient has anxious mood and appropriate affect. Patient has normal speech and thought process. Patient has no suicidal, homicidal ideation, intention or plan. Patient is no evidence of psychotic symptoms.  Loss Factors: NA  Historical Factors: NA  Risk Reduction Factors:   Sense of responsibility to family, Religious beliefs about death, Living with another person,  especially a relative, Positive social support, Positive therapeutic relationship and Positive coping skills or problem solving skills  Continued Clinical Symptoms:  Depression:   Recent sense of peace/wellbeing Unstable or Poor Therapeutic Relationship Medical Diagnoses and Treatments/Surgeries  Cognitive Features That Contribute To Risk:  Polarized thinking    Suicide Risk:  Minimal: No identifiable suicidal ideation.  Patients presenting with no risk factors but with morbid ruminations; may be classified as minimal risk based on the severity of the depressive symptoms  Discharge Diagnoses:   AXIS I:  Generalized Anxiety Disorder and Major Depression, Recurrent severe AXIS II:  Deferred AXIS III:   Past Medical History  Diagnosis Date  . Hashimoto's disease   . Depression   . Migraine   . Kidney stones    AXIS IV:  other psychosocial or environmental problems, problems related to social environment and problems with primary support group AXIS V:  61-70 mild symptoms  Plan Of Care/Follow-up recommendations:  Activity:  As tolerated Diet:  Regular  Is patient on multiple antipsychotic therapies at discharge:  No   Has Patient had three or more failed trials of antipsychotic monotherapy by history:  No  Recommended Plan for Multiple Antipsychotic Therapies: NA    [name],Lisa R. 08/30/2013, 12:39 PM

## 2013-08-30 NOTE — Discharge Summary (Signed)
Physician Discharge Summary Note  Patient:  [name] is an 43 y.o., female MRN:  [id number] DOB:  1970/08/11 Patient phone:  [phone] (home)  Patient address:   [address] [place] [address],  Total Time spent with patient: 15 minutes  Date of Admission:  08/24/2013 Date of Discharge: 08/30/2013  Reason for Admission:  Depression with suicide plan  Discharge Diagnoses: Principal Problem:   Suicide ideation Active Problems:   MDD (major depressive disorder)   GAD (generalized anxiety disorder)   Psychiatric Specialty Exam: Physical Exam  Constitutional: She is oriented to person, place, and time. She appears well-developed and well-nourished.  HENT:  Head: Normocephalic and atraumatic.  Neck: Normal range of motion.  Respiratory: Effort normal.  GI: Soft.  Musculoskeletal: Normal range of motion.  Neurological: She is alert and oriented to person, place, and time.  Skin: Skin is warm and dry.    Review of Systems  Constitutional: Negative.   HENT: Negative.   Eyes: Negative.   Respiratory: Negative.   Cardiovascular: Negative.   Gastrointestinal: Negative.   Genitourinary: Negative.   Musculoskeletal: Negative.   Skin: Negative.   Neurological: Negative.   Endo/Heme/Allergies: Negative.   Psychiatric/Behavioral: Positive for depression. The patient is nervous/anxious.     Blood pressure 120/82, pulse 93, temperature 98.5 F (36.9 C), temperature source Oral, resp. rate 18, height 5' 1.75" (1.568 m), weight 73.029 kg (161 lb).Body mass index is 29.7 kg/(m^2).  General Appearance: Casual  Eye Contact::  Good  Speech:  Normal Rate  Volume:  Normal  Mood:  Anxious  Affect:  Congruent  Thought Process:  Coherent  Orientation:  Full (Time, Place, and Person)  Thought Content:  WDL  Suicidal Thoughts:  No  Homicidal Thoughts:  No  Memory:  Immediate;   Good Recent;   Good Remote;   Good  Judgement:  Good  Insight:  Good  Psychomotor  Activity:  Normal  Concentration:  Good  Recall:  Good  Fund of Knowledge:Good  Language: Good  Akathisia:  No  Handed:  Right  AIMS (if indicated):     Assets:  Leisure Time Physical Health Resilience Social Support  Sleep:  Number of Hours: 6.5    Past Psychiatric History: Diagnosis:  Hospitalizations:  Outpatient Care:  Substance Abuse Care:  Self-Mutilation:  Suicidal Attempts:  Violent Behaviors:   Musculoskeletal: Strength & Muscle Tone: within normal limits Gait & Station: normal Patient leans: N/A  DSM5:  Depressive Disorders:  Major Depressive Disorder - Severe (296.23)  Axis Diagnosis:   AXIS I:  Anxiety Disorder NOS and Major Depression, Recurrent severe AXIS II:  Deferred AXIS III:   Past Medical History  Diagnosis Date  . Hashimoto's disease   . Depression   . Migraine   . Kidney stones    AXIS IV:  other psychosocial or environmental problems, problems related to social environment and problems with primary support group AXIS V:  61-70 mild symptoms  Level of Care:  OP  Hospital Course:  On admission:  Patient was admitted voluntarily and emergently from [facility] long emergency department for increased symptoms of depression, anxiety and suicidal ideation with plan of driving her car into accident or going into woods and shooting herself with a gun. Patient was referred for inpatient psychiatric hospitalization, crisis management and safety monitoring by her primary psychiatrist Dr. Jennelle Human who is concerned about her safety and not responding to her current medications. Patient reported her medication Abilify was stopped because of problems with  copayment otherwise is helpful and wants to restart, and reportedly stimulant medication is not helpful. Dr. Jennelle Human had also told patient that if medication had to be adjusted, the hospital was the best place. Patient has been suffering with symptoms of depression for the last two weeks with no end in sight. She  she has been feeling sad, down and tired, poor concentration, energy insomnia and trouble to care for herself and her children at home. Patient husband was aware and removed guns from house. Patient says that she has always struggled with depression, especially since childhood. It has gotten worse in the last 2 weeks. Patient had one attempt to kill herself by OD in college. Patient Has anxiety about something going to happen now family, separation anxiety and constantly worried about negative feelings and incidents. Patient bilateral grandfather was an alcoholic and mother was depressed and exposed to the abuse. Patient has been suffering with Hashimoto's thyroidhis on kidney stones. Patient has no history of substance abuse or legal problems. Patient denies any relationship problems. Patient has been living with her husband and 4 children ages 95, 58, 81 and 59. 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  During hospitalization:  Medications managed--Her Metadate for ADD was not continued during inpatient and her Paxil 40 mg daily for depression was discontinued.  Her Ativan 0.5 mg BID PRN anxiety and Gabapentin 1200 mg daily for neuropathic pain was continued along with her Calan 120 mg daily for depression.  Abilify 1 mg daily for depression, Wellbutrin 100 mg daily for depression, Buspar 7.5 mg BID for anxiety, Trazodone 25 mg at bedtime for sleep issues, and Imitrex 50 mg every 2 hours PRN headache.  [name] attended and participated in therapy.  She denied suicidal/homicidal ideations and auditory/visual hallucinations, follow-up appointments encouraged to attend, Rx given.  [name] is mentally and physically stable for discharge.  Consults:  None  Significant Diagnostic Studies:  labs: completed, reviewed, stable  Discharge Vitals:   Blood pressure 120/82, pulse 93, temperature 98.5 F (36.9 C), temperature source Oral, resp. rate 18, height 5' 1.75"  (1.568 m), weight 73.029 kg (161 lb). Body mass index is 29.7 kg/(m^2). Lab Results:   No results found for this or any previous visit (from the past 72 hour(s)).  Physical Findings: AIMS: Facial and Oral Movements Muscles of Facial Expression: None, normal Lips and Perioral Area: None, normal Jaw: None, normal Tongue: None, normal,Extremity Movements Upper (arms, wrists, hands, fingers): None, normal Lower (legs, knees, ankles, toes): None, normal, Trunk Movements Neck, shoulders, hips: None, normal, Overall Severity Severity of abnormal movements (highest score from questions above): None, normal Incapacitation due to abnormal movements: None, normal Patient's awareness of abnormal movements (rate only patient's report): No Awareness, Dental Status Current problems with teeth and/or dentures?: No Does patient usually wear dentures?: No  CIWA:  CIWA-Ar Total: 2 COWS:  COWS Total Score: 2  Psychiatric Specialty Exam: See Psychiatric Specialty Exam and Suicide Risk Assessment completed by Attending Physician prior to discharge.  Discharge destination:  Home  Is patient on multiple antipsychotic therapies at discharge:  No   Has Patient had three or more failed trials of antipsychotic monotherapy by history:  No  Recommended Plan for Multiple Antipsychotic Therapies: NA  Discharge Orders   Future Orders Complete By Expires   Activity as tolerated - No restrictions  As directed    Diet - low sodium heart healthy  As directed  Medication List    STOP taking these medications       PARoxetine 40 MG tablet  Commonly known as:  PAXIL      TAKE these medications     Indication   ARIPiprazole 2 MG tablet  Commonly known as:  ABILIFY  Take 0.5 tablets (1 mg total) by mouth at bedtime.      buPROPion 100 MG 12 hr tablet  Commonly known as:  WELLBUTRIN SR  Take 1 tablet (100 mg total) by mouth daily.   Indication:  Major Depressive Disorder     busPIRone 7.5 MG  tablet  Commonly known as:  BUSPAR  Take 1 tablet (7.5 mg total) by mouth 2 (two) times daily.   Indication:  Anxiety Disorder     gabapentin 600 MG tablet  Commonly known as:  NEURONTIN  Take 2 tablets (1,200 mg total) by mouth at bedtime.   Indication:  Neuropathic Pain     LORazepam 0.5 MG tablet  Commonly known as:  ATIVAN  Take 1 tablet (0.5 mg total) by mouth 2 (two) times daily as needed for anxiety.      methylphenidate 20 MG ER tablet  Commonly known as:  METADATE ER  Take 1 tablet (20 mg total) by mouth daily.   Indication:  Attention Deficit Disorder     SUMAtriptan 50 MG tablet  Commonly known as:  IMITREX  Take 1 tablet (50 mg total) by mouth every 2 (two) hours as needed for migraine or headache. May repeat in 2 hours if headache persists or recurs.      traZODone 25 mg Tabs tablet  Commonly known as:  DESYREL  Take 0.5 tablets (25 mg total) by mouth at bedtime and may repeat dose one time if needed.   Indication:  Trouble Sleeping     verapamil 120 MG CR tablet  Commonly known as:  CALAN-SR  Take 1 tablet (120 mg total) by mouth at bedtime.   Indication:  Vascular Headache           Follow-up Information   Follow up with Dr. Jennelle Humanottle - [facility] On 09/08/2013. (You are scheduled with Dr. Jennelle Humanottle on Wednesday, September 08, 2013 at 3:00 PM)    Contact information:   [address] [place], [place]   [address]  [phone]      Follow up with Sharlette DenseMickey Dew - [facility] On 09/03/2013. (You are scheduled with Sharlette DenseMickey Dew on Friday, September 03, 2013 at [facility])    Contact information:   [address] [place], [place]   [address]  [phone]      Follow-up recommendations:  Activity:  as tolerated Diet:  low-sodium heart healthy diet  Comments:   Take all your medications as prescribed by your mental healthcare provider. Report any adverse effects and or reactions from your medicines to your outpatient provider  promptly. Patient is instructed and cautioned to not engage in alcohol and or illegal drug use while on prescription medicines. In the event of worsening symptoms, patient is instructed to call the crisis hotline, 911 and or go to the nearest ED for appropriate evaluation and treatment of symptoms. Follow-up with your primary care provider for your other medical issues, concerns and or health care needs  Total Discharge Time:  Greater than 30 minutes.  SignedNanine Means: LORD, JAMISON, PMH-NP 08/30/2013, 9:53 AM  Patient was seen face-to-face for the evaluation, completed discharge suicide risk assessment, case discussed with physician extender and treatment team and made disposition  and plan. Reviewed the information documented and agree with the treatment plan.   [name],JANARDHAHA R. 08/30/2013 6:49 PM

## 2013-08-30 NOTE — Progress Notes (Signed)
Patient ID: [name], female   DOB: 11-02-1970, 43 y.o.   MRN: [id number]  Pt. Denies SI/HI and A/V hallucinations. Patient rates her depression and hopelessness at 1/10 for the day. Scheduled medications given to patient per physician's orders. Belongings returned to patient at time of discharge. Patient denies any pain or discomfort. Discharge instructions and medications were reviewed with patient. Patient verbalized understanding of both medications and discharge instructions. Q15 minute safety checks were maintained until discharge. No distress noted upon discharge.

## 2013-08-30 NOTE — Discharge Instructions (Signed)
Depression, Adult °Depression is feeling sad, low, down in the dumps, blue, gloomy, or empty. In general, there are two kinds of depression: °· Normal sadness or grief. This can happen after something upsetting. It often goes away on its own within 2 weeks. After losing a loved one (bereavement), normal sadness and grief may last longer than two weeks. It usually gets better with time. °· Clinical depression. This kind lasts longer than normal sadness or grief. It keeps you from doing the things you normally do in life. It is often hard to function at home, work, or at school. It may affect your relationships with others. Treatment is often needed. °GET HELP RIGHT AWAY IF: °· You have thoughts about hurting yourself or others. °· You lose touch with reality (psychotic symptoms). You may: °· See or hear things that are not real. °· Have untrue beliefs about your life or people around you. °· Your medicine is giving you problems. °MAKE SURE YOU: °· Understand these instructions. °· Will watch your condition. °· Will get help right away if you are not doing well or get worse. °Document Released: 08/17/2010 Document Revised: 04/08/2012 Document Reviewed: 11/14/2011 °ExitCare® Patient Information ©2014 [employer]. ° °

## 2013-08-30 NOTE — [facility] Group Notes (Signed)
[facility]   08/30/2013 8:45 AM  Participation Quality:  Alert, Appropriate and Oriented  Mood/Affect:  Calm  Depression Rating: 0    Anxiety Rating:  2-3  Thoughts of Suicide:  Pt denies SI/HI  Will you contract for safety?   Yes  Current AVH:  Pt denies  Plan for Discharge/Comments:  Pt attended discharge planning group and actively participated in group.  CSW provided pt with today's workbook.  Pt reports feeling stable to d/c today.  Pt will return [place] in [place] and follow up with Dr. Jennelle Humanottle and Dr. Debroah Loopu for medication management and therapy.  No further needs voiced by pt at this time.    Transportation Means: Pt reports access to transportation - husband will pick pt up  Supports: No supports mentioned at this time  Reyes IvanChelsea Horton, LCSW 08/30/2013 9:51 AM

## 2013-08-30 NOTE — Tx Team (Signed)
Interdisciplinary Treatment Plan Update (Adult)  Date: 08/30/2013  Time Reviewed:  9:45 AM  Progress in Treatment: Attending groups: Yes Participating in groups:  Yes Taking medication as prescribed:  Yes Tolerating medication:  Yes Family/Significant othe contact made: Yes, with pt's husband Patient understands diagnosis:  Yes Discussing patient identified problems/goals with staff:  Yes Medical problems stabilized or resolved:  Yes Denies suicidal/homicidal ideation: Yes Issues/concerns per patient self-inventory:  Yes Other:  New problem(s) identified: N/A  Discharge Plan or Barriers: Pt will follow up with Dr. Jennelle Humanottle and Dr. Debroah Loopu for medication management and therapy.    Reason for Continuation of Hospitalization: Stable to d/c today  Comments: N/A  Estimated length of stay: D/C today  For review of initial/current patient goals, please see plan of Lisa.  Attendees: Patient:  [name]  08/30/2013 10:24 AM   Family:     Physician:  [name] 08/30/2013 10:13 AM   Nursing:   Lisa ReichertBeverly Knight, [name] 08/30/2013 10:13 AM   Clinical Social Worker:  Lisa Ivanhelsea Horton, [name] 08/30/2013 10:13 AM   Other: Lisa Boardhrista Dopson, [name] 08/30/2013 10:13 AM   Other:  [name], [name] 08/30/2013 10:13 AM   Other:  [name], [name] 08/30/2013 10:13 AM   Other:  Lisa CorwinKim Maggio, [name] 08/30/2013 10:16 AM   Other:    Other:    Other:    Other:    Other:    Other:     Scribe for Treatment Team:   [name], [name], 08/30/2013 10:13 AM

## 2013-09-03 NOTE — Progress Notes (Signed)
Patient Discharge Instructions:  After Visit Summary (AVS):   Faxed to:  09/03/13 Discharge Summary Note:   Faxed to:  09/03/13 Psychiatric Admission Assessment Note:   Faxed to:  09/03/13 Suicide Risk Assessment - Discharge Assessment:   Faxed to:  09/03/13 Faxed/Sent to the Next Level Care provider:  09/03/13 Faxed to [facility] @ [phone] Faxed to [facility] @ [phone]  Jerelene ReddenSheena E [place], 09/03/2013, 3:10 PM

## 2014-09-05 ENCOUNTER — Other Ambulatory Visit: Payer: Self-pay | Admitting: Family Medicine

## 2014-09-05 DIAGNOSIS — R220 Localized swelling, mass and lump, head: Secondary | ICD-10-CM

## 2014-09-08 ENCOUNTER — Ambulatory Visit
Admission: RE | Admit: 2014-09-08 | Discharge: 2014-09-08 | Disposition: A | Discharge status: Home / Self Care | Payer: BLUE CROSS/BLUE SHIELD | Source: Ambulatory Visit | Attending: Family Medicine | Admitting: Family Medicine

## 2014-09-08 DIAGNOSIS — R220 Localized swelling, mass and lump, head: Secondary | ICD-10-CM

## 2014-09-08 MED ORDER — IOHEXOL 300 MG/ML  SOLN
75.0000 mL | Freq: Once | INTRAMUSCULAR | Status: AC | PRN
  Administered 2014-09-08: 75 mL via INTRAVENOUS

## 2016-06-03 ENCOUNTER — Ambulatory Visit (INDEPENDENT_AMBULATORY_CARE_PROVIDER_SITE_OTHER): Payer: 59 | Admitting: Cardiology

## 2016-06-03 ENCOUNTER — Encounter: Payer: Self-pay | Admitting: Cardiology

## 2016-06-03 VITALS — BP 110/60 | HR 70 | Ht 63.0 in | Wt 126.4 lb

## 2016-06-03 DIAGNOSIS — R002 Palpitations: Secondary | ICD-10-CM

## 2016-06-03 DIAGNOSIS — R0602 Shortness of breath: Secondary | ICD-10-CM | POA: Diagnosis not present

## 2016-06-03 NOTE — Patient Instructions (Signed)
Medication Instructions:  Your physician recommends that you continue on your current medications as directed. Please refer to the Current Medication list given to you today.   Labwork: None  Testing/Procedures: Your physician has requested that you have an echocardiogram. Echocardiography is a painless test that uses sound waves to create images of your heart. It provides your doctor with information about the size and shape of your heart and how well your heart's chambers and valves are working. This procedure takes approximately one hour. There are no restrictions for this procedure.  Your physician has recommended that you wear an event monitor. Event monitors are medical devices that record the heart's electrical activity. Doctors most often us these monitors to diagnose arrhythmias. Arrhythmias are problems with the speed or rhythm of the heartbeat. The monitor is a small, portable device. You can wear one while you do your normal daily activities. This is usually used to diagnose what is causing palpitations/syncope (passing out).  Follow-Up: Your physician recommends that you schedule a follow-up appointment AS NEEDED with Dr. Turner pending study results.  Any Other Special Instructions Will Be Listed Below (If Applicable).     If you need a refill on your cardiac medications before your next appointment, please call your pharmacy.   

## 2016-06-03 NOTE — Progress Notes (Signed)
Cardiology Office Note    Date:  06/03/2016   ID:  [name], DOB January 06, 1971, MRN [id number]  PCP:  Duane LopeAlan Ross, MD  Cardiologist:  Armanda Magicraci Turner, MD   Chief Complaint  Patient presents with  . Shortness of Breath  . Palpitations    History of Present Illness:  [name] is a 45 y.o. female with a history of Hashimoto's thyroiditis, depression and migraine HA's who is referred today for evaluation of palpitations and SOB.  She has noticed palpitations when exercising mainly when using her elliptical and feels like her heart is skipping beats. She says that when her heart gets to the 120-140's it will feel like is is flip flopping.  This will make her feel like she cannot take a deep breath.  If she yawns the breathing improves.  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  When she gets the palpitations it will make her feel SOB.  She denies any chest pain or pressure.  EKG in PCP office was normal.  She says that if someone is burning a fire outside she will have a problems catching her breath or walking by an open oven with the heat pouring.  She denies any LE edema, dizziness, claudication or syncope.      Past Medical History:  Diagnosis Date  . Depression   . Hashimoto's disease   . Kidney stones   . Migraine     History reviewed. No pertinent surgical history.  Current Medications: Outpatient Medications Prior to Visit  Medication Sig Dispense Refill  . ARIPiprazole (ABILIFY) 2 MG tablet Take 0.5 tablets (1 mg total) by mouth at bedtime. (Patient not taking: Reported on 06/03/2016) 30 tablet 0  . buPROPion (WELLBUTRIN SR) 100 MG 12 hr tablet Take 1 tablet (100 mg total) by mouth daily. (Patient not taking: Reported on 06/03/2016) 30 tablet 0  . busPIRone (BUSPAR) 7.5 MG tablet Take 1 tablet (7.5 mg total) by mouth 2 (two) times daily. (Patient not taking: Reported on 06/03/2016) 60  tablet 0  . gabapentin (NEURONTIN) 600 MG tablet Take 2 tablets (1,200 mg total) by mouth at bedtime. 60 tablet 0  . LORazepam (ATIVAN) 0.5 MG tablet Take 1 tablet (0.5 mg total) by mouth 2 (two) times daily as needed for anxiety. (Patient not taking: Reported on 06/03/2016) 14 tablet 0  . methylphenidate (METADATE ER) 20 MG ER tablet Take 1 tablet (20 mg total) by mouth daily.  0  . SUMAtriptan (IMITREX) 50 MG tablet Take 1 tablet (50 mg total) by mouth every 2 (two) hours as needed for migraine or headache. May repeat in 2 hours if headache persists or recurs. (Patient not taking: Reported on 06/03/2016) 10 tablet 0  . traZODone (DESYREL) 25 mg TABS tablet Take 0.5 tablets (25 mg total) by mouth at bedtime and may repeat dose one time if needed. (Patient not taking: Reported on 06/03/2016) 30 tablet 0  . verapamil (CALAN-SR) 120 MG CR tablet Take 1 tablet (120 mg total) by mouth at bedtime. 30 tablet 0   No facility-administered medications prior to visit.      Allergies:   Penicillins; Albuterol; Codeine; and Other   Social History   Social History  . Marital status: Married    Spouse name: N/A  . Number of children: N/A  . Years of education: N/A   Social History Main Topics  .  Smoking status: Never Smoker  . Smokeless tobacco: None  . Alcohol use No  . Drug use: No  . Sexual activity: Not Asked   Other Topics Concern  . None   Social History Narrative  . None     Family History:  The patient's family history includes Depression in her mother; Rheum arthritis in her sister.   ROS:   Please see the history of present illness.    ROS All other systems reviewed and are negative.  No flowsheet data found.     PHYSICAL EXAM:   VS:  BP 110/60   Pulse 70   Ht 5\' 3"  (1.6 m)   Wt 126 lb 6.4 oz (57.3 kg)   BMI 22.39 kg/m    GEN: Well nourished, well developed, in no acute distress  HEENT: normal  Neck: no JVD, carotid bruits, or masses Cardiac: RRR; no murmurs, rubs, or  gallops,no edema.  Intact distal pulses bilaterally.  Respiratory:  clear to auscultation bilaterally, normal work of breathing GI: soft, nontender, nondistended, + BS MS: no deformity or atrophy  Skin: warm and dry, no rash Neuro:  Alert and Oriented x 3, Strength and sensation are intact Psych: euthymic mood, full affect  Wt Readings from Last 3 Encounters:  06/03/16 126 lb 6.4 oz (57.3 kg)      Studies/Labs Reviewed:   EKG:  EKG is  ordered today.  The ekg ordered today demonstrates NSR with no ST changes and normal intervals.   Recent Labs: No results found for requested labs within last 8760 hours.   Lipid Panel No results found for: CHOL, TRIG, HDL, CHOLHDL, VLDL, LDLCALC, LDLDIRECT  Additional studies/ records that were reviewed today include: office notes from PCP    ASSESSMENT:    1. SOB (shortness of breath)   2. Heart palpitations      PLAN:  In order of problems listed above:  1. SOB - this is very atypical and she describes it more like she cannot take a deep breath in which resolves with yawning.  I will get a 2D echo to assess LVF. She does not have any DOE or CP and EKG is non ischemic.  2. Palpitations - this has occurred in the past with normal workup on Holter.  I will get a 30 day heart monitor and hopefully will be able to catch something when she is having symptoms.    Medication Adjustments/Labs and Tests Ordered: Current medicines are reviewed at length with the patient today.  Concerns regarding medicines are outlined above.  Medication changes, Labs and Tests ordered today are listed in the Patient Instructions below.  There are no Patient Instructions on file for this visit.   Signed, Armanda Magicraci Turner, MD  06/03/2016 2:31 PM    [facility] [address], [place], [place]  [address] Phone: [phone]; Fax: [phone]

## 2016-06-06 ENCOUNTER — Ambulatory Visit (HOSPITAL_COMMUNITY): Payer: 59 | Attending: Cardiology

## 2016-06-06 ENCOUNTER — Other Ambulatory Visit: Payer: Self-pay

## 2016-06-06 DIAGNOSIS — R0602 Shortness of breath: Secondary | ICD-10-CM | POA: Diagnosis not present

## 2016-06-06 DIAGNOSIS — R002 Palpitations: Secondary | ICD-10-CM

## 2016-06-06 DIAGNOSIS — I340 Nonrheumatic mitral (valve) insufficiency: Secondary | ICD-10-CM | POA: Diagnosis not present

## 2016-06-12 ENCOUNTER — Ambulatory Visit (INDEPENDENT_AMBULATORY_CARE_PROVIDER_SITE_OTHER): Payer: 59

## 2016-06-12 DIAGNOSIS — R002 Palpitations: Secondary | ICD-10-CM

## 2016-06-12 DIAGNOSIS — R0602 Shortness of breath: Secondary | ICD-10-CM

## 2016-07-24 ENCOUNTER — Telehealth: Payer: Self-pay | Admitting: Cardiology

## 2016-07-24 NOTE — Telephone Encounter (Signed)
Informed patient of results and verbal understanding expressed.  Patient states she has Hashimoto's and was recently diagnosed with hypothyroidism Dr. Leslie DalesAltheimer is treating. She reports she knows her thyroid levels are "out of whack" as they are trying to dose her Synthroid- she is currently taking 50 mcg qd. Before getting any labs drawn, she requests to get a copy of labs and note from Dr. Leslie DalesAltheimer.  Attempted to call [facility] to request notes, but received recording that it is permanently closed and [facility].  Will wait until next week to call and request records.   Med list updated (she was taking Synthroid 25 mcg qd at last OV with Dr. Mayford Knifeurner).

## 2016-07-24 NOTE — Telephone Encounter (Signed)
-----   Message from Quintella Reichertraci R Turner, MD sent at 07/18/2016  9:58 PM EST ----- Please get a BMET and a TSH

## 2016-07-24 NOTE — Telephone Encounter (Signed)
Ms. Remi HaggardCornelius is returning your call. Please call. Thanks.

## 2016-08-13 DIAGNOSIS — G43009 Migraine without aura, not intractable, without status migrainosus: Secondary | ICD-10-CM | POA: Diagnosis not present

## 2016-08-13 DIAGNOSIS — G43829 Menstrual migraine, not intractable, without status migrainosus: Secondary | ICD-10-CM | POA: Diagnosis not present

## 2016-08-13 DIAGNOSIS — F321 Major depressive disorder, single episode, moderate: Secondary | ICD-10-CM | POA: Diagnosis not present

## 2016-08-13 DIAGNOSIS — G44219 Episodic tension-type headache, not intractable: Secondary | ICD-10-CM | POA: Diagnosis not present

## 2016-08-27 DIAGNOSIS — F321 Major depressive disorder, single episode, moderate: Secondary | ICD-10-CM | POA: Diagnosis not present

## 2016-09-13 DIAGNOSIS — E038 Other specified hypothyroidism: Secondary | ICD-10-CM | POA: Diagnosis not present

## 2016-09-13 DIAGNOSIS — E063 Autoimmune thyroiditis: Secondary | ICD-10-CM | POA: Diagnosis not present

## 2016-09-17 DIAGNOSIS — E063 Autoimmune thyroiditis: Secondary | ICD-10-CM | POA: Diagnosis not present

## 2016-09-17 DIAGNOSIS — E038 Other specified hypothyroidism: Secondary | ICD-10-CM | POA: Diagnosis not present

## 2016-10-08 DIAGNOSIS — F321 Major depressive disorder, single episode, moderate: Secondary | ICD-10-CM | POA: Diagnosis not present

## 2016-10-09 DIAGNOSIS — M2550 Pain in unspecified joint: Secondary | ICD-10-CM | POA: Diagnosis not present

## 2016-10-09 DIAGNOSIS — I10 Essential (primary) hypertension: Secondary | ICD-10-CM | POA: Diagnosis not present

## 2016-10-09 DIAGNOSIS — R0602 Shortness of breath: Secondary | ICD-10-CM | POA: Diagnosis not present

## 2016-10-09 DIAGNOSIS — Z0000 Encounter for general adult medical examination without abnormal findings: Secondary | ICD-10-CM | POA: Diagnosis not present

## 2016-10-09 DIAGNOSIS — F321 Major depressive disorder, single episode, moderate: Secondary | ICD-10-CM | POA: Diagnosis not present

## 2016-11-15 DIAGNOSIS — E038 Other specified hypothyroidism: Secondary | ICD-10-CM | POA: Diagnosis not present

## 2016-11-15 DIAGNOSIS — E063 Autoimmune thyroiditis: Secondary | ICD-10-CM | POA: Diagnosis not present

## 2016-11-19 DIAGNOSIS — E063 Autoimmune thyroiditis: Secondary | ICD-10-CM | POA: Diagnosis not present

## 2016-11-19 DIAGNOSIS — E038 Other specified hypothyroidism: Secondary | ICD-10-CM | POA: Diagnosis not present

## 2016-12-02 DIAGNOSIS — F321 Major depressive disorder, single episode, moderate: Secondary | ICD-10-CM | POA: Diagnosis not present

## 2016-12-04 DIAGNOSIS — F321 Major depressive disorder, single episode, moderate: Secondary | ICD-10-CM | POA: Diagnosis not present

## 2016-12-10 DIAGNOSIS — G44219 Episodic tension-type headache, not intractable: Secondary | ICD-10-CM | POA: Diagnosis not present

## 2016-12-10 DIAGNOSIS — G43009 Migraine without aura, not intractable, without status migrainosus: Secondary | ICD-10-CM | POA: Diagnosis not present

## 2017-01-22 DIAGNOSIS — E063 Autoimmune thyroiditis: Secondary | ICD-10-CM | POA: Diagnosis not present

## 2017-01-27 DIAGNOSIS — E038 Other specified hypothyroidism: Secondary | ICD-10-CM | POA: Diagnosis not present

## 2017-01-27 DIAGNOSIS — E063 Autoimmune thyroiditis: Secondary | ICD-10-CM | POA: Diagnosis not present

## 2017-01-28 DIAGNOSIS — F321 Major depressive disorder, single episode, moderate: Secondary | ICD-10-CM | POA: Diagnosis not present

## 2017-01-31 DIAGNOSIS — F321 Major depressive disorder, single episode, moderate: Secondary | ICD-10-CM | POA: Diagnosis not present

## 2017-01-31 DIAGNOSIS — F333 Major depressive disorder, recurrent, severe with psychotic symptoms: Secondary | ICD-10-CM | POA: Diagnosis not present

## 2017-01-31 DIAGNOSIS — Z79899 Other long term (current) drug therapy: Secondary | ICD-10-CM | POA: Diagnosis not present

## 2017-02-07 DIAGNOSIS — F332 Major depressive disorder, recurrent severe without psychotic features: Secondary | ICD-10-CM | POA: Diagnosis not present

## 2017-02-18 DIAGNOSIS — F332 Major depressive disorder, recurrent severe without psychotic features: Secondary | ICD-10-CM | POA: Diagnosis not present

## 2017-02-19 DIAGNOSIS — F332 Major depressive disorder, recurrent severe without psychotic features: Secondary | ICD-10-CM | POA: Diagnosis not present

## 2017-02-20 DIAGNOSIS — F332 Major depressive disorder, recurrent severe without psychotic features: Secondary | ICD-10-CM | POA: Diagnosis not present

## 2017-02-21 ENCOUNTER — Ambulatory Visit (HOSPITAL_COMMUNITY): Payer: Self-pay | Admitting: Psychiatry

## 2017-02-21 DIAGNOSIS — F332 Major depressive disorder, recurrent severe without psychotic features: Secondary | ICD-10-CM | POA: Diagnosis not present

## 2017-02-24 DIAGNOSIS — F332 Major depressive disorder, recurrent severe without psychotic features: Secondary | ICD-10-CM | POA: Diagnosis not present

## 2017-02-25 DIAGNOSIS — F332 Major depressive disorder, recurrent severe without psychotic features: Secondary | ICD-10-CM | POA: Diagnosis not present

## 2017-02-26 DIAGNOSIS — F332 Major depressive disorder, recurrent severe without psychotic features: Secondary | ICD-10-CM | POA: Diagnosis not present

## 2017-02-27 DIAGNOSIS — F332 Major depressive disorder, recurrent severe without psychotic features: Secondary | ICD-10-CM | POA: Diagnosis not present

## 2017-02-28 ENCOUNTER — Ambulatory Visit (HOSPITAL_COMMUNITY): Payer: Self-pay | Admitting: Psychiatry

## 2017-02-28 DIAGNOSIS — F332 Major depressive disorder, recurrent severe without psychotic features: Secondary | ICD-10-CM | POA: Diagnosis not present

## 2017-03-03 DIAGNOSIS — F332 Major depressive disorder, recurrent severe without psychotic features: Secondary | ICD-10-CM | POA: Diagnosis not present

## 2017-03-04 DIAGNOSIS — F332 Major depressive disorder, recurrent severe without psychotic features: Secondary | ICD-10-CM | POA: Diagnosis not present

## 2017-03-05 DIAGNOSIS — F332 Major depressive disorder, recurrent severe without psychotic features: Secondary | ICD-10-CM | POA: Diagnosis not present

## 2017-03-06 DIAGNOSIS — F332 Major depressive disorder, recurrent severe without psychotic features: Secondary | ICD-10-CM | POA: Diagnosis not present

## 2017-03-07 DIAGNOSIS — F332 Major depressive disorder, recurrent severe without psychotic features: Secondary | ICD-10-CM | POA: Diagnosis not present

## 2017-03-10 DIAGNOSIS — F332 Major depressive disorder, recurrent severe without psychotic features: Secondary | ICD-10-CM | POA: Diagnosis not present

## 2017-03-11 DIAGNOSIS — F332 Major depressive disorder, recurrent severe without psychotic features: Secondary | ICD-10-CM | POA: Diagnosis not present

## 2017-03-12 DIAGNOSIS — F332 Major depressive disorder, recurrent severe without psychotic features: Secondary | ICD-10-CM | POA: Diagnosis not present

## 2017-03-13 DIAGNOSIS — F332 Major depressive disorder, recurrent severe without psychotic features: Secondary | ICD-10-CM | POA: Diagnosis not present

## 2017-03-14 DIAGNOSIS — F332 Major depressive disorder, recurrent severe without psychotic features: Secondary | ICD-10-CM | POA: Diagnosis not present

## 2017-03-17 DIAGNOSIS — F332 Major depressive disorder, recurrent severe without psychotic features: Secondary | ICD-10-CM | POA: Diagnosis not present

## 2017-03-18 DIAGNOSIS — F332 Major depressive disorder, recurrent severe without psychotic features: Secondary | ICD-10-CM | POA: Diagnosis not present

## 2017-03-19 DIAGNOSIS — F332 Major depressive disorder, recurrent severe without psychotic features: Secondary | ICD-10-CM | POA: Diagnosis not present

## 2017-03-20 DIAGNOSIS — F332 Major depressive disorder, recurrent severe without psychotic features: Secondary | ICD-10-CM | POA: Diagnosis not present

## 2017-03-21 DIAGNOSIS — F332 Major depressive disorder, recurrent severe without psychotic features: Secondary | ICD-10-CM | POA: Diagnosis not present

## 2017-03-24 DIAGNOSIS — F332 Major depressive disorder, recurrent severe without psychotic features: Secondary | ICD-10-CM | POA: Diagnosis not present

## 2017-03-25 DIAGNOSIS — F332 Major depressive disorder, recurrent severe without psychotic features: Secondary | ICD-10-CM | POA: Diagnosis not present

## 2017-03-26 DIAGNOSIS — F332 Major depressive disorder, recurrent severe without psychotic features: Secondary | ICD-10-CM | POA: Diagnosis not present

## 2017-03-27 DIAGNOSIS — F332 Major depressive disorder, recurrent severe without psychotic features: Secondary | ICD-10-CM | POA: Diagnosis not present

## 2017-03-28 DIAGNOSIS — E063 Autoimmune thyroiditis: Secondary | ICD-10-CM | POA: Diagnosis not present

## 2017-03-28 DIAGNOSIS — E038 Other specified hypothyroidism: Secondary | ICD-10-CM | POA: Diagnosis not present

## 2017-03-28 DIAGNOSIS — F332 Major depressive disorder, recurrent severe without psychotic features: Secondary | ICD-10-CM | POA: Diagnosis not present

## 2017-04-01 DIAGNOSIS — E038 Other specified hypothyroidism: Secondary | ICD-10-CM | POA: Diagnosis not present

## 2017-04-01 DIAGNOSIS — F332 Major depressive disorder, recurrent severe without psychotic features: Secondary | ICD-10-CM | POA: Diagnosis not present

## 2017-04-01 DIAGNOSIS — E063 Autoimmune thyroiditis: Secondary | ICD-10-CM | POA: Diagnosis not present

## 2017-04-04 DIAGNOSIS — F332 Major depressive disorder, recurrent severe without psychotic features: Secondary | ICD-10-CM | POA: Diagnosis not present

## 2017-04-07 DIAGNOSIS — F332 Major depressive disorder, recurrent severe without psychotic features: Secondary | ICD-10-CM | POA: Diagnosis not present

## 2017-04-09 DIAGNOSIS — F332 Major depressive disorder, recurrent severe without psychotic features: Secondary | ICD-10-CM | POA: Diagnosis not present

## 2017-04-14 DIAGNOSIS — F332 Major depressive disorder, recurrent severe without psychotic features: Secondary | ICD-10-CM | POA: Diagnosis not present

## 2017-04-16 DIAGNOSIS — F332 Major depressive disorder, recurrent severe without psychotic features: Secondary | ICD-10-CM | POA: Diagnosis not present

## 2017-04-18 DIAGNOSIS — F332 Major depressive disorder, recurrent severe without psychotic features: Secondary | ICD-10-CM | POA: Diagnosis not present

## 2017-05-16 DIAGNOSIS — F321 Major depressive disorder, single episode, moderate: Secondary | ICD-10-CM | POA: Diagnosis not present

## 2017-05-26 DIAGNOSIS — H01021 Squamous blepharitis right upper eyelid: Secondary | ICD-10-CM | POA: Diagnosis not present

## 2017-05-26 DIAGNOSIS — H01025 Squamous blepharitis left lower eyelid: Secondary | ICD-10-CM | POA: Diagnosis not present

## 2017-05-26 DIAGNOSIS — H01024 Squamous blepharitis left upper eyelid: Secondary | ICD-10-CM | POA: Diagnosis not present

## 2017-05-26 DIAGNOSIS — H01022 Squamous blepharitis right lower eyelid: Secondary | ICD-10-CM | POA: Diagnosis not present

## 2017-06-27 DIAGNOSIS — F333 Major depressive disorder, recurrent, severe with psychotic symptoms: Secondary | ICD-10-CM | POA: Diagnosis not present

## 2017-06-27 DIAGNOSIS — Z79899 Other long term (current) drug therapy: Secondary | ICD-10-CM | POA: Diagnosis not present

## 2017-06-27 DIAGNOSIS — F321 Major depressive disorder, single episode, moderate: Secondary | ICD-10-CM | POA: Diagnosis not present

## 2017-08-07 DIAGNOSIS — Z01419 Encounter for gynecological examination (general) (routine) without abnormal findings: Secondary | ICD-10-CM | POA: Diagnosis not present

## 2017-08-07 DIAGNOSIS — Z6825 Body mass index (BMI) 25.0-25.9, adult: Secondary | ICD-10-CM | POA: Diagnosis not present

## 2017-08-07 DIAGNOSIS — Z1231 Encounter for screening mammogram for malignant neoplasm of breast: Secondary | ICD-10-CM | POA: Diagnosis not present

## 2017-08-07 DIAGNOSIS — Z124 Encounter for screening for malignant neoplasm of cervix: Secondary | ICD-10-CM | POA: Diagnosis not present

## 2017-09-19 DIAGNOSIS — F321 Major depressive disorder, single episode, moderate: Secondary | ICD-10-CM | POA: Diagnosis not present

## 2017-11-17 DIAGNOSIS — F321 Major depressive disorder, single episode, moderate: Secondary | ICD-10-CM | POA: Diagnosis not present

## 2018-01-12 ENCOUNTER — Encounter (HOSPITAL_COMMUNITY): Payer: Self-pay | Admitting: Emergency Medicine

## 2018-01-12 ENCOUNTER — Emergency Department (HOSPITAL_COMMUNITY): Payer: BLUE CROSS/BLUE SHIELD

## 2018-01-12 ENCOUNTER — Emergency Department (HOSPITAL_COMMUNITY)
Admission: EM | Admit: 2018-01-12 | Discharge: 2018-01-12 | Disposition: A | Discharge status: Home / Self Care | Payer: BLUE CROSS/BLUE SHIELD | Attending: Emergency Medicine | Admitting: Emergency Medicine

## 2018-01-12 DIAGNOSIS — S0181XA Laceration without foreign body of other part of head, initial encounter: Secondary | ICD-10-CM | POA: Diagnosis not present

## 2018-01-12 DIAGNOSIS — M542 Cervicalgia: Secondary | ICD-10-CM

## 2018-01-12 DIAGNOSIS — W01198A Fall on same level from slipping, tripping and stumbling with subsequent striking against other object, initial encounter: Secondary | ICD-10-CM | POA: Insufficient documentation

## 2018-01-12 DIAGNOSIS — S199XXA Unspecified injury of neck, initial encounter: Secondary | ICD-10-CM | POA: Diagnosis not present

## 2018-01-12 DIAGNOSIS — M25561 Pain in right knee: Secondary | ICD-10-CM | POA: Insufficient documentation

## 2018-01-12 DIAGNOSIS — Z23 Encounter for immunization: Secondary | ICD-10-CM | POA: Diagnosis not present

## 2018-01-12 DIAGNOSIS — Y9301 Activity, walking, marching and hiking: Secondary | ICD-10-CM | POA: Insufficient documentation

## 2018-01-12 DIAGNOSIS — Z79899 Other long term (current) drug therapy: Secondary | ICD-10-CM | POA: Diagnosis not present

## 2018-01-12 DIAGNOSIS — M5489 Other dorsalgia: Secondary | ICD-10-CM | POA: Diagnosis not present

## 2018-01-12 DIAGNOSIS — R58 Hemorrhage, not elsewhere classified: Secondary | ICD-10-CM | POA: Diagnosis not present

## 2018-01-12 DIAGNOSIS — Y929 Unspecified place or not applicable: Secondary | ICD-10-CM | POA: Insufficient documentation

## 2018-01-12 DIAGNOSIS — S060X0A Concussion without loss of consciousness, initial encounter: Secondary | ICD-10-CM | POA: Diagnosis not present

## 2018-01-12 DIAGNOSIS — F321 Major depressive disorder, single episode, moderate: Secondary | ICD-10-CM | POA: Diagnosis not present

## 2018-01-12 DIAGNOSIS — Y999 Unspecified external cause status: Secondary | ICD-10-CM | POA: Insufficient documentation

## 2018-01-12 DIAGNOSIS — R52 Pain, unspecified: Secondary | ICD-10-CM | POA: Diagnosis not present

## 2018-01-12 DIAGNOSIS — M25562 Pain in left knee: Secondary | ICD-10-CM | POA: Insufficient documentation

## 2018-01-12 DIAGNOSIS — S8991XA Unspecified injury of right lower leg, initial encounter: Secondary | ICD-10-CM | POA: Diagnosis not present

## 2018-01-12 DIAGNOSIS — S0990XA Unspecified injury of head, initial encounter: Secondary | ICD-10-CM | POA: Diagnosis not present

## 2018-01-12 DIAGNOSIS — S8992XA Unspecified injury of left lower leg, initial encounter: Secondary | ICD-10-CM | POA: Diagnosis not present

## 2018-01-12 MED ORDER — ONDANSETRON HCL 4 MG PO TABS: 4.0000 mg | ORAL_TABLET | Freq: Three times a day (TID) | ORAL | 0 refills | Status: DC | PRN

## 2018-01-12 MED ORDER — ACETAMINOPHEN 325 MG PO TABS
650.0000 mg | ORAL_TABLET | Freq: Once | ORAL | Status: AC
  Administered 2018-01-12: 650 mg via ORAL
  Filled 2018-01-12: qty 2

## 2018-01-12 MED ORDER — ONDANSETRON 4 MG PO TBDP
4.0000 mg | ORAL_TABLET | Freq: Once | ORAL | Status: AC
  Administered 2018-01-12: 4 mg via ORAL
  Filled 2018-01-12: qty 1

## 2018-01-12 MED ORDER — TETANUS-DIPHTH-ACELL PERTUSSIS 5-2.5-18.5 LF-MCG/0.5 IM SUSP
0.5000 mL | Freq: Once | INTRAMUSCULAR | Status: AC
  Administered 2018-01-12: 0.5 mL via INTRAMUSCULAR
  Filled 2018-01-12: qty 0.5

## 2018-01-12 NOTE — Discharge Instructions (Addendum)
Please read and follow all provided instructions.  Your diagnoses today include: Concussion   Tests performed today include: CT imaging of your head: This was reassuring  CT imaging of your neck - this did not show evidence of fracture or dislocation.  Xrays of your knee's: this did not show evidence of fracture or dislocation.  Vital signs. See below for your results today.   Home care instructions:  A concussion is a brain injury from a direct hit (blow) to the head or body or a jolt of the head or neck that causes the brain to move back and forth inside the skull (such as in a car crash). This blow causes the brain to shake quickly back and forth inside the skull. This can damage brain cells and cause chemical changes in the brain. A concussion may also be known as a mild traumatic brain injury (TBI). Concussions are usually not life-threatening, but the effects of a concussion can be serious. If you have a concussion, you are more likely to experience concussion-like symptoms after a direct blow to the head in the future.  Symptoms are usually temporary, but they may last for days, weeks, or even longer. Some symptoms may appear right away but other symptoms may not show up for hours or days. Every head injury is different. Symptoms may include Headaches. This can include a feeling of pressure in the head. Memory problems. Trouble concentrating, organizing, or making decisions. Slowness in thinking, acting or reacting, speaking, or reading. Confusion. Fatigue. Changes in eating or sleeping patterns. Problems with coordination or balance. Nausea or vomiting. Numbness or tingling. Sensitivity to light or noise. Vision or hearing problems. Reduced sense of smell. Irritability or mood changes. Dizziness. Lack of motivation. Seeing or hearing things that other people do not see or hear (hallucinations).   Please avoid alcohol for the next week.  Please rest and drink plenty of water.   We recommend that you avoid any activity that may lead to another head injury for at least 1 week and until you are cleared by your physician at follow up. We also recommend "brain rest" - please avoid TV, cell phones, tablets, computers as much as possible for the next 48 hours.   Follow-up instructions: Please follow-up with your primary care provider this week for further evaluation of symptoms and treatment   Return instructions:  Please return to the Emergency Department if you do not get better, if you get worse, or new symptoms OR If you develop severe headaches, disequilibrium/difficulty walking, double vision, difficulty concentrating, sensitivity to light, changes in mood, nausea/vomiting, ongoing dizziness you can return for re-evaluation. Please return if you have any other emergent concerns.  Additional Information:  Follow attached handouts.   Your vital signs today were: BP (!) 161/94 (BP Location: Left Arm)    Pulse 82    Temp 98 F (36.7 C) (Oral)    Resp 18    LMP 01/10/2018    SpO2 100%  If your blood pressure (BP) was elevated above 135/85 this visit, please have this repeated by your doctor within one month. ---------------

## 2018-01-12 NOTE — ED Notes (Signed)
Wound on frontal head cleansed and Michael PA in to evaluate. Dermabond to be applied

## 2018-01-12 NOTE — ED Provider Notes (Signed)
[facility]-EMERGENCY DEPT Provider Note   CSN: [id number] Arrival date & time: 01/12/18  1248     History   Chief Complaint Chief Complaint  Patient presents with  . Fall  . Laceration    HPI [name] is a 47 y.o. female past medical history of Hashimoto's, migraines who presents emergency department today for fall.  Reports that around 11 AM-12 PM today she was ambulating when she tripped over a baby gate that she did not see.  She reports she fell face first and struck her knees as well as her face.  She denies any loss of consciousness.  She reports that she was able to get up on her own but told her children to call EMS.  She reports since the event she has felt constant nausea, dizziness, photophobia as well as a headache.  She does not take anything for symptoms.  She also reports bilateral knee pain from the fall that is worse with palpation as well as attempting to ambulate.  She denies history of prior intracranial trauma or blood thinner use.  She reports that she does have mild midline neck pain.  She denies any numbness/tingling/weakness of the upper or lower extremities.  No bowel/bladder incontinence.  She is noted to have a laceration over the forehead with bleeding controlled.  She is unsure of her last tetanus shot.  She denies any other arthralgias.  HPI  Past Medical History:  Diagnosis Date  . Depression   . Hashimoto's disease   . Kidney stones   . Migraine     Patient Active Problem List   Diagnosis Date Noted  . MDD (major depressive disorder) 08/24/2013  . GAD (generalized anxiety disorder) 08/24/2013  . Suicide ideation 08/24/2013    History reviewed. No pertinent surgical history.   OB History   None      Home Medications    Prior to Admission medications   Medication Sig Start Date End Date Taking? Authorizing Provider  B Complex Vitamins (B COMPLEX-B12 PO) Take by mouth daily.    [provider]    Biotin 5000 MCG CAPS Take by mouth daily.    [provider]  busPIRone (BUSPAR) 30 MG tablet Take 30 mg by mouth 2 (two) times daily.  05/27/16   [provider]  Cholecalciferol (VITAMIN D3 PO) Take by mouth daily.    [provider]  Docusate Calcium (STOOL SOFTENER PO) Take by mouth 3 (three) times daily.    [provider]  IRON PO Take by mouth daily.    [provider]  lamoTRIgine (LAMICTAL) 150 MG tablet Take 150 mg by mouth daily.  05/18/16   [provider]  levothyroxine (SYNTHROID, LEVOTHROID) 50 MCG tablet Take 50 mcg by mouth daily before breakfast.    [provider]  lithium carbonate (LITHOBID) 300 MG CR tablet Take 300 mg by mouth QID.  05/12/16   [provider]  Magnesium 200 MG TABS Take by mouth daily.    [provider]  OVER THE COUNTER MEDICATION Take 600 mg by mouth. N-ACETYL cysteine    [provider]  Polyethylene Glycol 3350 (MIRALAX PO) Take by mouth daily.    [provider]  QUEtiapine (SEROQUEL XR) 400 MG 24 hr tablet Take 800 mg by mouth daily.  05/26/16   [provider]    Family History Family History  Problem Relation Age of Onset  . Depression Mother   . Rheum arthritis  Sister     Social History Social History   Tobacco Use  . Smoking status: Never Smoker  . Smokeless tobacco: Never Used  Substance Use Topics  . Alcohol use: No  . Drug use: No     Allergies   Penicillins; Albuterol; Codeine; and Other   Review of Systems Review of Systems  All other systems reviewed and are negative.    Physical Exam Updated Vital Signs BP (!) 161/94 (BP Location: Left Arm)   Pulse 82   Temp 98 F (36.7 C) (Oral)   Resp 18   LMP 01/10/2018   SpO2 100%   Physical Exam  Constitutional: She appears well-developed and well-nourished.  Non-toxic appearing  HENT:  Head: Normocephalic and atraumatic. Head is without raccoon's eyes and  without Battle's sign.  Right Ear: Hearing, tympanic membrane, external ear and ear canal normal. Tympanic membrane is not perforated and not erythematous. No hemotympanum.  Left Ear: Hearing, tympanic membrane, external ear and ear canal normal. Tympanic membrane is not perforated and not erythematous. No hemotympanum.  Nose: No rhinorrhea, nasal deformity, septal deviation or nasal septal hematoma. No epistaxis. Right sinus exhibits no maxillary sinus tenderness and no frontal sinus tenderness. Left sinus exhibits no maxillary sinus tenderness and no frontal sinus tenderness.  Mouth/Throat: Uvula is midline, oropharynx is clear and moist and mucous membranes are normal.  No CSF otorrhea. No battle signs or racoon eyes. No palpable open or depressed skull fractures.  No hemotympanum.  Patient does have a 1.5 cm superficial linear laceration between eyebrows. This does not separate after cleansing the area and visualizing in bloodless field. Appears to only involve epidermis.   Eyes: Pupils are equal, round, and reactive to light. Conjunctivae, EOM and lids are normal. Right eye exhibits no discharge. Left eye exhibits no discharge. Right conjunctiva is not injected. Left conjunctiva is not injected. No scleral icterus. Right eye exhibits normal extraocular motion and no nystagmus. Left eye exhibits normal extraocular motion and no nystagmus.  Neck: Trachea normal, normal range of motion, full passive range of motion without pain and phonation normal. Neck supple. No spinous process tenderness and no muscular tenderness present. No neck rigidity. No tracheal deviation and normal range of motion present.  Patient with midline spinous ttp to level of C4-5. No step offs.   Cardiovascular: Normal rate, regular rhythm, normal heart sounds and intact distal pulses.  Pulses:      Radial pulses are 2+ on the right side, and 2+ on the left side.       Dorsalis pedis pulses are 2+ on the right side, and 2+ on the  left side.       Posterior tibial pulses are 2+ on the right side, and 2+ on the left side.  Pulmonary/Chest: Effort normal and breath sounds normal. No respiratory distress. She exhibits no tenderness.  Abdominal: Soft. Normal appearance. She exhibits no distension. There is no tenderness.  Musculoskeletal:  No clavicular deformities bilaterally.  Passive range of motion for bilateral shoulders, elbows, wrists, all digits, hips, ankles without pain or difficulty.  No snuffbox tenderness palpation bilaterally.  Negative logroll test bilaterally.  No sacral crepitus.  Patient does have tenderness diffusely of bilateral knees and pain with range of motion.  No deformities noted.  Normal strength and sensation for upper and lower extremities.  Compartments are soft for upper and lower extremities.  She is neurovascular intact upper and lower extremities.  Neurological: She is alert. She has normal strength and normal  reflexes. No cranial nerve deficit or sensory deficit. She displays a negative Romberg sign. Coordination and gait normal.  Reflex Scores:      Bicep reflexes are 2+ on the right side and 2+ on the left side.      Patellar reflexes are 2+ on the right side and 2+ on the left side.      Achilles reflexes are 2+ on the right side and 2+ on the left side. Speech clear. Follows commands. No facial droop. PERRLA. EOMI. Normal peripheral fields. CN III-XII intact.  Grossly moves all extremities 4 without ataxia. Coordination intact. Able and appropriate strength for age to upper and lower extremities bilaterally including grip strength & plantar flexion/dorsiflexion. Sensation to light touch intact bilaterally for upper and lower.   Skin: Skin is warm and dry. Capillary refill takes less than 2 seconds. Laceration noted. She is not diaphoretic. No pallor.  Psychiatric: She has a normal mood and affect.  Nursing note and vitals reviewed.    ED Treatments / Results  Labs (all labs ordered  are listed, but only abnormal results are displayed) Labs Reviewed - No data to display  EKG None  Radiology Ct Head Wo Contrast  Result Date: 01/12/2018 CLINICAL DATA:  Head trauma, minor, high clinical risk. Fall onto [employer] floor. Cervical spine pain. Initial encounter. EXAM: CT HEAD WITHOUT CONTRAST CT CERVICAL SPINE WITHOUT CONTRAST TECHNIQUE: Multidetector CT imaging of the head and cervical spine was performed following the standard protocol without intravenous contrast. Multiplanar CT image reconstructions of the cervical spine were also generated. COMPARISON:  10/10/2014 FINDINGS: CT HEAD FINDINGS Brain: No evidence of acute infarction, hemorrhage, hydrocephalus, extra-axial collection or mass lesion/mass effect. Vascular: No hyperdense vessel or unexpected calcification. Skull: Contusion/laceration along the lower central forehead with bubble of soft tissue gas. Negative for fracture. Sinuses/Orbits: No evidence of injury CT CERVICAL SPINE FINDINGS Alignment: No traumatic malalignment Skull base and vertebrae: Negative for fracture Soft tissues and spinal canal: No prevertebral fluid or swelling. No visible canal hematoma. Disc levels: Mild generalized facet spurring. No evidence of cord impingement. Upper chest: Negative IMPRESSION: 1. No evidence of acute intracranial or cervical spine injury. 2. Forehead laceration without fracture. Electronically Signed   By: Marnee Spring M.D.   On: 01/12/2018 14:43   Ct Cervical Spine Wo Contrast  Result Date: 01/12/2018 CLINICAL DATA:  Head trauma, minor, high clinical risk. Fall onto [employer] floor. Cervical spine pain. Initial encounter. EXAM: CT HEAD WITHOUT CONTRAST CT CERVICAL SPINE WITHOUT CONTRAST TECHNIQUE: Multidetector CT imaging of the head and cervical spine was performed following the standard protocol without intravenous contrast. Multiplanar CT image reconstructions of the cervical spine were also generated. COMPARISON:  10/10/2014  FINDINGS: CT HEAD FINDINGS Brain: No evidence of acute infarction, hemorrhage, hydrocephalus, extra-axial collection or mass lesion/mass effect. Vascular: No hyperdense vessel or unexpected calcification. Skull: Contusion/laceration along the lower central forehead with bubble of soft tissue gas. Negative for fracture. Sinuses/Orbits: No evidence of injury CT CERVICAL SPINE FINDINGS Alignment: No traumatic malalignment Skull base and vertebrae: Negative for fracture Soft tissues and spinal canal: No prevertebral fluid or swelling. No visible canal hematoma. Disc levels: Mild generalized facet spurring. No evidence of cord impingement. Upper chest: Negative IMPRESSION: 1. No evidence of acute intracranial or cervical spine injury. 2. Forehead laceration without fracture. Electronically Signed   By: Marnee Spring M.D.   On: 01/12/2018 14:43   Dg Knee Complete 4 Views Left  Result Date: 01/12/2018 CLINICAL DATA:  Pain following fall  EXAM: LEFT KNEE - COMPLETE 4+ VIEW COMPARISON:  None. FINDINGS: Frontal, lateral, and bilateral oblique views were obtained. No fracture or dislocation. No joint effusion. Joint spaces appear normal. No erosive change. IMPRESSION: No evident fracture or joint effusion.  No appreciable arthropathy. Electronically Signed   By: Bretta Bang III M.D.   On: 01/12/2018 14:19   Dg Knee Complete 4 Views Right  Result Date: 01/12/2018 CLINICAL DATA:  Larey Seat over baby gate.  Pain. EXAM: RIGHT KNEE - COMPLETE 4+ VIEW COMPARISON:  None. FINDINGS: No evidence of fracture, dislocation, or joint effusion. No evidence of arthropathy or other focal bone abnormality. Soft tissues are unremarkable. IMPRESSION: Negative. Electronically Signed   By: Elsie Stain M.D.   On: 01/12/2018 14:29    Procedures .Marland KitchenLaceration Repair Date/Time: 01/12/2018 3:23 PM Performed by: Jacinto Halim, PA-C Authorized by: Jacinto Halim, PA-C   Consent:    Consent obtained:  Verbal   Consent given by:   Patient   Risks discussed:  Infection, need for additional repair, nerve damage, poor wound healing, poor cosmetic result, pain, retained foreign body, tendon damage and vascular damage   Alternatives discussed:  No treatment Anesthesia (see MAR for exact dosages):    Anesthesia method:  None Laceration details:    Location:  Face   Face location:  Forehead   Length (cm):  1.5 Repair type:    Repair type:  Simple Exploration:    Wound exploration: wound explored through full range of motion and entire depth of wound probed and visualized   Treatment:    Area cleansed with:  Shur-Clens   Amount of cleaning:  Standard   Irrigation solution:  Sterile saline   Irrigation volume:  100   Irrigation method:  Syringe   Visualized foreign bodies/material removed: no   Skin repair:    Repair method:  Tissue adhesive Approximation:    Approximation:  Close Post-procedure details:    Dressing:  Open (no dressing)   Patient tolerance of procedure:  Tolerated well, no immediate complications   (including critical care time)  Medications Ordered in ED Medications  ondansetron (ZOFRAN-ODT) disintegrating tablet 4 mg (has no administration in time range)  acetaminophen (TYLENOL) tablet 650 mg (has no administration in time range)  Tdap (BOOSTRIX) injection 0.5 mL (has no administration in time range)     Initial Impression / Assessment and Plan / ED Course  I have reviewed the triage vital signs and the nursing notes.  Pertinent labs & imaging results that were available during my care of the patient were reviewed by me and considered in my medical decision making (see chart for details).     47 year old female with mechanical fall that occurred at approximately 11-12:00 today.  She is noted to have laceration of her front forehead is approximate 1.5 cm in length.  Patient is also complaining of headache, nausea, dizziness as well as photophobia, neck pain and knee pain.  She is  neurovascular intact upper and lower extremities.  Normal neurologic exam as above.  CT head doubt any intracranial abnormalities or skull fractures.  CT cervical spine without any fractures or dislocations.  X-rays of the knees unremarkable.  Patient with laceration occurred less than 8 hours ago.  Pressure irrigation was performed.  Wound was explored through full depth in bloodless field without evidence of foreign body.  This appears superficial and does not require sutures.  Dermabond was used to close this.  Patient was given wound care instructions at home.  Tetanus updated.  Patient symptoms are consistent with a concussion.  Recommended brain rest.  Recommend over-the-counter medication needed for headache.  Recommended patient avoid activities that can lead to further head trauma until cleared by PCP in 1 week.  I advised the patient to follow-up with PCP in 1 week. Specific return precautions discussed. Time was given for all questions to be answered. The patient verbalized understanding and agreement with plan. The patient appears safe for discharge home.  Final Clinical Impressions(s) / ED Diagnoses   Final diagnoses:  Concussion without loss of consciousness, initial encounter  Laceration of forehead, initial encounter  Neck pain  Acute pain of both knees    ED Discharge Orders    None       Princella Pellegrini 01/12/18 Daiva Eves, MD 01/13/18 [phone]

## 2018-01-12 NOTE — ED Triage Notes (Signed)
Per [facility] pt from home for falling over baby gait that she didn't see.  Patient has laceration on front of head and left lateral side of head.  Pt also c/o knee pain and now generalized body pais.  No LOC or taking blood thinners.  Vitals: initial 160/101, last 155/98, 96HR, R18, 98% on room air.

## 2018-01-12 NOTE — ED Notes (Signed)
Hair shampooed to remove blood, no injuries to scalp. meds and updated Tdap given, pt ti go CT and X ray, husband at bedside

## 2018-01-19 DIAGNOSIS — Z79899 Other long term (current) drug therapy: Secondary | ICD-10-CM | POA: Diagnosis not present

## 2018-01-19 DIAGNOSIS — H6122 Impacted cerumen, left ear: Secondary | ICD-10-CM | POA: Diagnosis not present

## 2018-01-19 DIAGNOSIS — S060X9A Concussion with loss of consciousness of unspecified duration, initial encounter: Secondary | ICD-10-CM | POA: Diagnosis not present

## 2018-01-19 DIAGNOSIS — R5383 Other fatigue: Secondary | ICD-10-CM | POA: Diagnosis not present

## 2018-03-05 DIAGNOSIS — M25511 Pain in right shoulder: Secondary | ICD-10-CM | POA: Diagnosis not present

## 2018-03-05 DIAGNOSIS — M7541 Impingement syndrome of right shoulder: Secondary | ICD-10-CM | POA: Diagnosis not present

## 2018-03-16 DIAGNOSIS — F321 Major depressive disorder, single episode, moderate: Secondary | ICD-10-CM | POA: Diagnosis not present

## 2018-04-07 DIAGNOSIS — M25511 Pain in right shoulder: Secondary | ICD-10-CM | POA: Diagnosis not present

## 2018-04-07 DIAGNOSIS — M7541 Impingement syndrome of right shoulder: Secondary | ICD-10-CM | POA: Diagnosis not present

## 2018-04-08 DIAGNOSIS — E038 Other specified hypothyroidism: Secondary | ICD-10-CM | POA: Diagnosis not present

## 2018-04-08 DIAGNOSIS — E063 Autoimmune thyroiditis: Secondary | ICD-10-CM | POA: Diagnosis not present

## 2018-04-14 DIAGNOSIS — E063 Autoimmune thyroiditis: Secondary | ICD-10-CM | POA: Diagnosis not present

## 2018-04-14 DIAGNOSIS — E038 Other specified hypothyroidism: Secondary | ICD-10-CM | POA: Diagnosis not present

## 2018-04-30 DIAGNOSIS — R21 Rash and other nonspecific skin eruption: Secondary | ICD-10-CM | POA: Diagnosis not present

## 2018-04-30 DIAGNOSIS — J019 Acute sinusitis, unspecified: Secondary | ICD-10-CM | POA: Diagnosis not present

## 2018-05-20 DIAGNOSIS — M7541 Impingement syndrome of right shoulder: Secondary | ICD-10-CM | POA: Diagnosis not present

## 2018-05-20 DIAGNOSIS — M7542 Impingement syndrome of left shoulder: Secondary | ICD-10-CM | POA: Diagnosis not present

## 2018-05-20 DIAGNOSIS — M25512 Pain in left shoulder: Secondary | ICD-10-CM | POA: Diagnosis not present

## 2018-05-25 ENCOUNTER — Encounter: Payer: Self-pay | Admitting: Emergency Medicine

## 2018-06-02 ENCOUNTER — Other Ambulatory Visit: Payer: Self-pay | Admitting: Psychiatry

## 2018-06-08 ENCOUNTER — Ambulatory Visit: Payer: Self-pay | Admitting: Psychiatry

## 2018-06-17 DIAGNOSIS — M7541 Impingement syndrome of right shoulder: Secondary | ICD-10-CM | POA: Diagnosis not present

## 2018-06-17 DIAGNOSIS — M7542 Impingement syndrome of left shoulder: Secondary | ICD-10-CM | POA: Diagnosis not present

## 2018-06-17 DIAGNOSIS — M7502 Adhesive capsulitis of left shoulder: Secondary | ICD-10-CM | POA: Diagnosis not present

## 2018-06-30 ENCOUNTER — Encounter: Payer: Self-pay | Admitting: Psychiatry

## 2018-06-30 ENCOUNTER — Ambulatory Visit: Payer: BLUE CROSS/BLUE SHIELD | Admitting: Psychiatry

## 2018-06-30 DIAGNOSIS — F331 Major depressive disorder, recurrent, moderate: Secondary | ICD-10-CM | POA: Diagnosis not present

## 2018-06-30 DIAGNOSIS — F4001 Agoraphobia with panic disorder: Secondary | ICD-10-CM

## 2018-06-30 DIAGNOSIS — F4010 Social phobia, unspecified: Secondary | ICD-10-CM | POA: Diagnosis not present

## 2018-06-30 DIAGNOSIS — F411 Generalized anxiety disorder: Secondary | ICD-10-CM

## 2018-06-30 MED ORDER — METHYLPHENIDATE HCL ER 18 MG PO TB24: 18.0000 mg | ORAL_TABLET | Freq: Every day | ORAL | 0 refills | Status: DC

## 2018-06-30 NOTE — Patient Instructions (Signed)
If Concerta does not work call back.

## 2018-06-30 NOTE — Progress Notes (Signed)
[name] [id number] 15-Nov-1970 47 y.o.  Subjective:   Patient ID:  [name] is a 47 y.o. (DOB [birth year]) female.  Chief Complaint:  Chief Complaint  Patient presents with  . Anxiety  . Depression  . Follow-up    CHANGE MEDS    HPI [name] presents to the office today for follow-up of above and trial of modafinil for energy and alertness.  Couldn't tolerate it.  Overall pretty good but tired a lot.Pt reports that mood is Anxious, Depressed and in part over shoulder pain. and describes anxiety as Minimal. Anxiety symptoms include: Excessive Worry,.  One mild panic out of sleep.   Pt reports has interrupted sleep and eats in middle of night. Pt reports that appetite is good. Pt reports that energy is lethargic and good. Concentration is down slightly. Suicidal thoughts:  denied by patient.   Review of Systems:  Review of Systems  Medications: I have reviewed the patient's current medications.  Current Outpatient Medications  Medication Sig Dispense Refill  . B Complex Vitamins (B COMPLEX-B12 PO) Take by mouth daily.    . Biotin 5000 MCG CAPS Take by mouth daily.    . Cholecalciferol (VITAMIN D3 PO) Take by mouth daily.    Tery Sanfilippo Calcium (STOOL SOFTENER PO) Take by mouth 3 (three) times daily.    Marland Kitchen lamoTRIgine (LAMICTAL) 150 MG tablet TAKE ONE TABLET BY MOUTH TWO TIMES A DAY 60 tablet 1  . levothyroxine (SYNTHROID, LEVOTHROID) 125 MCG tablet Take 50 mcg by mouth daily before breakfast.     . lithium 300 MG tablet Take 300 mg by mouth 2 (two) times daily.    Marland Kitchen OVER THE COUNTER MEDICATION Take 600 mg by mouth. N-ACETYL cysteine    . PARoxetine (PAXIL) 30 MG tablet Take 30 mg by mouth daily.    . Polyethylene Glycol 3350 (MIRALAX PO) Take by mouth daily.    . QUEtiapine (SEROQUEL) 100 MG tablet Take 100 mg by mouth at bedtime.    . verapamil (VERELAN PM) 180 MG 24 hr capsule Take 180 mg by mouth at bedtime.    . IRON PO Take by mouth daily.     . Magnesium 200 MG TABS Take by mouth daily.    . methylphenidate 18 MG PO CR tablet Take 1 tablet (18 mg total) by mouth daily. 30 tablet 0  . modafinil (PROVIGIL) 200 MG tablet Take 200 mg by mouth every morning.    . ondansetron (ZOFRAN) 4 MG tablet Take 1 tablet (4 mg total) by mouth every 8 (eight) hours as needed for nausea or vomiting. (Patient not taking: Reported on 06/30/2018) 4 tablet 0   No current facility-administered medications for this visit.     Medication Side Effects: Other: night eating may be related.  Allergies:  Allergies  Allergen Reactions  . Penicillins Rash  . Albuterol   . Codeine   . Other     Phenegran    Past Medical History:  Diagnosis Date  . Depression   . Hashimoto's disease   . Kidney stones   . Migraine     Family History  Problem Relation Age of Onset  . Depression Mother   . Rheum arthritis Sister     Social History   Socioeconomic History  . Marital status: Married    Spouse name: Not on file  . Number of children: Not on file  . Years of education: Not on file  . Highest education level: Not on  file  Occupational History  . Not on file  Social Needs  . Financial resource strain: Not on file  . Food insecurity:    Worry: Not on file    Inability: Not on file  . Transportation needs:    Medical: Not on file    Non-medical: Not on file  Tobacco Use  . Smoking status: Never Smoker  . Smokeless tobacco: Never Used  Substance and Sexual Activity  . Alcohol use: No  . Drug use: No  . Sexual activity: Not on file  Lifestyle  . Physical activity:    Days per week: Not on file    Minutes per session: Not on file  . Stress: Not on file  Relationships  . Social connections:    Talks on phone: Not on file    Gets together: Not on file    Attends religious service: Not on file    Active member of club or organization: Not on file    Attends meetings of clubs or organizations: Not on file    Relationship status: Not on  file  . Intimate partner violence:    Fear of current or ex partner: Not on file    Emotionally abused: Not on file    Physically abused: Not on file    Forced sexual activity: Not on file  Other Topics Concern  . Not on file  Social History Narrative  . Not on file    Past Medical History, Surgical history, Social history, and Family history were reviewed and updated as appropriate.   Please see review of systems for further details on the patient's review from today.   Objective:   Physical Exam:  There were no vitals taken for this visit.  Physical Exam  Lab Review:     Component Value Date/Time   NA 139 08/23/2013 1600   K 4.0 08/23/2013 1600   CL 101 08/23/2013 1600   CO2 27 08/23/2013 1600   GLUCOSE 111 (H) 08/23/2013 1600   BUN 12 08/23/2013 1600   CREATININE 0.85 08/23/2013 1600   CALCIUM 9.3 08/23/2013 1600   PROT 8.2 08/23/2013 1600   ALBUMIN 3.9 08/23/2013 1600   AST 18 08/23/2013 1600   ALT 14 08/23/2013 1600   ALKPHOS 72 08/23/2013 1600   BILITOT 0.3 08/23/2013 1600   GFRNONAA 84 (L) 08/23/2013 1600   GFRAA >90 08/23/2013 1600       Component Value Date/Time   WBC 6.4 08/23/2013 1600   RBC 4.78 08/23/2013 1600   HGB 12.7 08/23/2013 1600   HCT 39.3 08/23/2013 1600   PLT 249 08/23/2013 1600   MCV 82.2 08/23/2013 1600   MCH 26.6 08/23/2013 1600   MCHC 32.3 08/23/2013 1600   RDW 13.5 08/23/2013 1600   LYMPHSABS 2.1 01/17/2011 1812   MONOABS 0.7 01/17/2011 1812   EOSABS 0.1 01/17/2011 1812   BASOSABS 0.0 01/17/2011 1812    No results found for: POCLITH, LITHIUM   No results found for: PHENYTOIN, PHENOBARB, VALPROATE, CBMZ   .res Assessment: Plan:    Major depressive disorder, recurrent episode, moderate (HCC)  Panic disorder with agoraphobia  Generalized anxiety disorder  Social anxiety disorder   Hx severe TRD with Suicide attempt.  Residual problems with home schooling DT tired and not focused.  Didn't tolerate modafinil 100 DT  anxiety.  Now doesn't need shoulder surgery so that is a relief.  She may have underlying ADD on top of the obvious impairment from the depression. Option retry stimulant  tried in 2014 briefly and go low in the dosage to improve mood and attention, energy and focus and for productivity.    Concerta 18 mg am and reduce caffeine. Discussed potential benefits, risks, and side effects of stimulants with patient to include increased heart rate, palpitations, insomnia, increased anxiety, increased irritability, or decreased appetite.  Instructed patient to contact office if experiencing any significant tolerability issues.  If this does not work we should try Vyvanse or other similar Adderall based stimulants.  We discussed the risk of it causing agitation and other sorts of psychiatric side effects and please let us know.  If it is ineffective I want her to call back and we will raise the dose before her next appointment.  This appt was 30 mins.  FU 8 weeks.  Meredith Staggersarey Cottle, MD, DFAPA   Please see After Visit Summary for patient specific instructions.  No future appointments.  No orders of the defined types were placed in this encounter.     -------------------------------

## 2018-07-04 ENCOUNTER — Other Ambulatory Visit: Payer: Self-pay | Admitting: Psychiatry

## 2018-07-04 DIAGNOSIS — J069 Acute upper respiratory infection, unspecified: Secondary | ICD-10-CM | POA: Diagnosis not present

## 2018-08-03 ENCOUNTER — Telehealth: Payer: Self-pay | Admitting: Psychiatry

## 2018-08-03 ENCOUNTER — Other Ambulatory Visit: Payer: Self-pay | Admitting: Psychiatry

## 2018-08-03 MED ORDER — METHYLPHENIDATE HCL ER 18 MG PO TB24: 18.0000 mg | ORAL_TABLET | Freq: Every day | ORAL | 0 refills | Status: DC

## 2018-08-03 MED ORDER — METHYLPHENIDATE HCL ER (OSM) 18 MG PO TBCR: 18.0000 mg | EXTENDED_RELEASE_TABLET | Freq: Every day | ORAL | 0 refills | Status: DC

## 2018-08-03 NOTE — Progress Notes (Signed)
3 refills concerta 18

## 2018-08-03 NOTE — Telephone Encounter (Signed)
Last fill 12/07 #30  Concerta er 18mg  1 every morning

## 2018-08-03 NOTE — Telephone Encounter (Signed)
[name] called to request refill on her concerta. Send to [facility] [address]  Appt. 09/02/18

## 2018-08-04 ENCOUNTER — Other Ambulatory Visit: Payer: Self-pay | Admitting: Psychiatry

## 2018-08-25 DIAGNOSIS — G43009 Migraine without aura, not intractable, without status migrainosus: Secondary | ICD-10-CM | POA: Diagnosis not present

## 2018-09-02 ENCOUNTER — Encounter: Payer: Self-pay | Admitting: Psychiatry

## 2018-09-02 ENCOUNTER — Ambulatory Visit: Payer: BLUE CROSS/BLUE SHIELD | Admitting: Psychiatry

## 2018-09-02 VITALS — BP 138/90

## 2018-09-02 DIAGNOSIS — F4010 Social phobia, unspecified: Secondary | ICD-10-CM

## 2018-09-02 DIAGNOSIS — F331 Major depressive disorder, recurrent, moderate: Secondary | ICD-10-CM | POA: Diagnosis not present

## 2018-09-02 DIAGNOSIS — F4001 Agoraphobia with panic disorder: Secondary | ICD-10-CM

## 2018-09-02 DIAGNOSIS — F411 Generalized anxiety disorder: Secondary | ICD-10-CM

## 2018-09-02 DIAGNOSIS — F900 Attention-deficit hyperactivity disorder, predominantly inattentive type: Secondary | ICD-10-CM

## 2018-09-02 MED ORDER — METHYLPHENIDATE HCL ER (OSM) 27 MG PO TBCR: 27.0000 mg | EXTENDED_RELEASE_TABLET | ORAL | 0 refills | Status: DC

## 2018-09-02 NOTE — Patient Instructions (Signed)
Constipation management 1.  Loss of water 2.  Powdered fiber supplement such as MiraLAX, Citrucel, etc. preferably with a meal 3.  2 stool softeners a day 4.  Milk of magnesia or magnesium tablets if needed

## 2018-09-02 NOTE — Progress Notes (Signed)
[name] [id number] 04/30/1971 48 y.o.  Subjective:   Patient ID:  [name] is a 48 y.o. (DOB May 01, 1971) female.  Chief Complaint:  Chief Complaint  Patient presents with  . Follow-up    Medication Management  . Fatigue  . Depression  . Anxiety   Last seen June 30, 2018 HPI [name] presents to the office today for follow-up of above and trial of Concerta 18 for energy and alertness and focus and off label for depression.    Still has to nap and rest at times with Concerta but it seemed to help, but hard to explain.  Clarity better.  Easier to make decisions.  Don't feel as daft.  Still hard listening to sermons, drift off all my life.  Is doing better with home schooling.  Overall pretty good but tired a lot.Pt reports that mood is much better and handling stress better. and describes anxiety as Minimal. Anxiety symptoms include: Excessive Worry,.  One mild panic out of sleep.   Pt reports has interrupted sleep and eats in middle of night. Pt reports that appetite is good. Pt reports that energy is lethargic and good. Concentration is down slightly. Suicidal thoughts:  denied by patient.  Past Psychiatric Medication Trials: modafinil SE, Concerta   Review of Systems:  Review of Systems  Gastrointestinal: Positive for constipation.  Neurological: Negative for tremors and weakness.  Psychiatric/Behavioral: Positive for decreased concentration. Negative for agitation, behavioral problems, confusion, dysphoric mood, hallucinations, self-injury, sleep disturbance and suicidal ideas. The patient is not nervous/anxious and is not hyperactive.     Medications: I have reviewed the patient's current medications.  Current Outpatient Medications  Medication Sig Dispense Refill  . B Complex Vitamins (B COMPLEX-B12 PO) Take by mouth daily.    . Cholecalciferol (VITAMIN D3 PO) Take by mouth daily.    Tery Sanfilippo Calcium (STOOL SOFTENER PO) Take by mouth 3  (three) times daily.    Marland Kitchen lamoTRIgine (LAMICTAL) 150 MG tablet TAKE ONE TABLET BY MOUTH TWO TIMES A DAY 60 tablet 0  . levothyroxine (SYNTHROID, LEVOTHROID) 125 MCG tablet Take 125 mcg by mouth daily before breakfast.     . lithium carbonate (LITHOBID) 300 MG CR tablet TAKE TWO TABLETS BY MOUTH TWICE A DAY (Patient taking differently: Take 1,200 mg by mouth daily. ) 120 tablet 2  . Magnesium 200 MG TABS Take by mouth daily.    . methylphenidate 18 MG PO CR tablet Take 1 tablet (18 mg total) by mouth daily. 30 tablet 0  . methylphenidate 18 MG PO CR tablet Take 1 tablet (18 mg total) by mouth daily. 30 tablet 0  . [START ON 09/28/2018] methylphenidate 18 MG PO CR tablet Take 1 tablet (18 mg total) by mouth daily. 30 tablet 0  . OVER THE COUNTER MEDICATION Take 600 mg by mouth. N-ACETYL cysteine    . PARoxetine (PAXIL) 30 MG tablet Take 30 mg by mouth daily.    . Polyethylene Glycol 3350 (MIRALAX PO) Take by mouth daily.    . QUEtiapine (SEROQUEL) 100 MG tablet TAKE 1 TABLET BY MOUTH AT BEDTIME 30 tablet 4  . verapamil (VERELAN PM) 180 MG 24 hr capsule Take 180 mg by mouth at bedtime.    . Biotin 5000 MCG CAPS Take by mouth daily.    . IRON PO Take by mouth daily.    Marland Kitchen lithium 300 MG tablet Take 300 mg by mouth 2 (two) times daily.    . methylphenidate (CONCERTA)  27 MG PO CR tablet Take 1 tablet (27 mg total) by mouth every morning. 30 tablet 0  . modafinil (PROVIGIL) 200 MG tablet Take 200 mg by mouth every morning.    . ondansetron (ZOFRAN) 4 MG tablet Take 1 tablet (4 mg total) by mouth every 8 (eight) hours as needed for nausea or vomiting. (Patient not taking: Reported on 06/30/2018) 4 tablet 0   No current facility-administered medications for this visit.     Medication Side Effects: Other: night eating may be related.  Allergies:  Allergies  Allergen Reactions  . Perphenazine Anaphylaxis and Swelling    Throat swelling and facial muscle contractions    . Imipramine Other (See  Comments)    Can't remember adverse effect  . Penicillins Rash  . Albuterol   . Codeine   . Other     Phenegran    Past Medical History:  Diagnosis Date  . Depression   . Hashimoto's disease   . Kidney stones   . Migraine     Family History  Problem Relation Age of Onset  . Depression Mother   . Rheum arthritis Sister     Social History   Socioeconomic History  . Marital status: Married    Spouse name: Not on file  . Number of children: Not on file  . Years of education: Not on file  . Highest education level: Not on file  Occupational History  . Not on file  Social Needs  . Financial resource strain: Not on file  . Food insecurity:    Worry: Not on file    Inability: Not on file  . Transportation needs:    Medical: Not on file    Non-medical: Not on file  Tobacco Use  . Smoking status: Never Smoker  . Smokeless tobacco: Never Used  Substance and Sexual Activity  . Alcohol use: No  . Drug use: No  . Sexual activity: Not on file  Lifestyle  . Physical activity:    Days per week: Not on file    Minutes per session: Not on file  . Stress: Not on file  Relationships  . Social connections:    Talks on phone: Not on file    Gets together: Not on file    Attends religious service: Not on file    Active member of club or organization: Not on file    Attends meetings of clubs or organizations: Not on file    Relationship status: Not on file  . Intimate partner violence:    Fear of current or ex partner: Not on file    Emotionally abused: Not on file    Physically abused: Not on file    Forced sexual activity: Not on file  Other Topics Concern  . Not on file  Social History Narrative  . Not on file    Past Medical History, Surgical history, Social history, and Family history were reviewed and updated as appropriate.   Please see review of systems for further details on the patient's review from today.   Objective:   Physical Exam:  BP 138/90 (BP  Location: Right Arm)   Physical Exam Constitutional:      General: She is not in acute distress.    Appearance: She is well-developed.  Musculoskeletal:        General: No deformity.  Neurological:     Mental Status: She is alert and oriented to person, place, and time.     Motor: No tremor.  Coordination: Coordination normal.     Gait: Gait normal.  Psychiatric:        Attention and Perception: Attention normal. She is attentive.        Mood and Affect: Mood normal. Mood is not anxious or depressed. Affect is not labile, blunt, angry or inappropriate.        Speech: Speech normal.        Behavior: Behavior normal.        Thought Content: Thought content normal. Thought content does not include homicidal or suicidal ideation. Thought content does not include homicidal or suicidal plan.        Cognition and Memory: Cognition normal.        Judgment: Judgment normal.     Comments: Insight and general self-awareness is moderately impaired chronically.     Lab Review:     Component Value Date/Time   NA 139 08/23/2013 1600   K 4.0 08/23/2013 1600   CL 101 08/23/2013 1600   CO2 27 08/23/2013 1600   GLUCOSE 111 (H) 08/23/2013 1600   BUN 12 08/23/2013 1600   CREATININE 0.85 08/23/2013 1600   CALCIUM 9.3 08/23/2013 1600   PROT 8.2 08/23/2013 1600   ALBUMIN 3.9 08/23/2013 1600   AST 18 08/23/2013 1600   ALT 14 08/23/2013 1600   ALKPHOS 72 08/23/2013 1600   BILITOT 0.3 08/23/2013 1600   GFRNONAA 84 (L) 08/23/2013 1600   GFRAA >90 08/23/2013 1600       Component Value Date/Time   WBC 6.4 08/23/2013 1600   RBC 4.78 08/23/2013 1600   HGB 12.7 08/23/2013 1600   HCT 39.3 08/23/2013 1600   PLT 249 08/23/2013 1600   MCV 82.2 08/23/2013 1600   MCH 26.6 08/23/2013 1600   MCHC 32.3 08/23/2013 1600   RDW 13.5 08/23/2013 1600   LYMPHSABS 2.1 01/17/2011 1812   MONOABS 0.7 01/17/2011 1812   EOSABS 0.1 01/17/2011 1812   BASOSABS 0.0 01/17/2011 1812    No results found for:  POCLITH, LITHIUM   No results found for: PHENYTOIN, PHENOBARB, VALPROATE, CBMZ   .res Assessment: Plan:    Major depressive disorder, recurrent episode, moderate (HCC) - Plan: methylphenidate (CONCERTA) 27 MG PO CR tablet  Panic disorder with agoraphobia  Generalized anxiety disorder  Social anxiety disorder  Attention deficit hyperactivity disorder (ADHD), predominantly inattentive type - Plan: methylphenidate (CONCERTA) 27 MG PO CR tablet   Hx severe TRD with Suicide attempt.  Insight and general self-awareness is moderately impaired chronically complicating and delaying treatment.  Residual problems with home schooling DT tired and not focused.  Didn't tolerate modafinil 100 DT anxiety.    She may have underlying ADD on top of the obvious impairment from the depression.  Her mood is pretty well managed at the present which is remarkable in and of itself as she has been chronically depressed.  She still has problems with consistent productivity and the need to nap that seems excessive.  So she has a partial benefit from Concerta 18 mg a day  Partial response so  Trial Minimal increase in Concerta 27 mg am recommended to maximize benefit and watch the amount of caffeine. Discussed potential benefits, risks, and side effects of stimulants with patient to include increased heart rate, palpitations, insomnia, increased anxiety, increased irritability, or decreased appetite.  Instructed patient to contact office if experiencing any significant tolerability issues.  If this does not work we should try Vyvanse or other similar Adderall based stimulants.  We discussed the risk  of it causing agitation and other sorts of psychiatric side effects and please let us know.  If it is ineffective I want her to call back and we will raise the dose before her next appointment.  Discussed potential metabolic side effects associated with atypical antipsychotics, as well as potential risk for movement side  effects. Advised pt to contact office if movement side effects occur. Seroquel needed for sleep and mood.  Constipation management 1.  Loss of water 2.  Powdered fiber supplement such as MiraLAX, Citrucel, etc. preferably with a meal 3.  2 stool softeners a day 4.  Milk of magnesia or magnesium tablets if needed  This appt was 30 mins.  FU 12 weeks.  Meredith Staggers, MD, DFAPA   Please see After Visit Summary for patient specific instructions.  No future appointments.  No orders of the defined types were placed in this encounter.     -------------------------------

## 2018-09-09 ENCOUNTER — Other Ambulatory Visit: Payer: Self-pay | Admitting: Psychiatry

## 2018-09-17 ENCOUNTER — Other Ambulatory Visit: Payer: Self-pay | Admitting: Psychiatry

## 2018-10-09 ENCOUNTER — Other Ambulatory Visit: Payer: Self-pay | Admitting: Psychiatry

## 2018-10-09 ENCOUNTER — Telehealth: Payer: Self-pay | Admitting: Psychiatry

## 2018-10-09 DIAGNOSIS — F331 Major depressive disorder, recurrent, moderate: Secondary | ICD-10-CM

## 2018-10-09 DIAGNOSIS — F900 Attention-deficit hyperactivity disorder, predominantly inattentive type: Secondary | ICD-10-CM

## 2018-10-09 MED ORDER — METHYLPHENIDATE HCL ER (OSM) 27 MG PO TBCR: 27.0000 mg | EXTENDED_RELEASE_TABLET | ORAL | 0 refills | Status: DC

## 2018-10-09 NOTE — Telephone Encounter (Signed)
Submitted to provider for approval 

## 2018-10-09 NOTE — Telephone Encounter (Signed)
[name] called to request refill on her Concerta.  Next appt 12/01/18.  Send to [facility] at [address]

## 2018-10-23 ENCOUNTER — Telehealth: Payer: Self-pay | Admitting: Psychiatry

## 2018-10-23 MED ORDER — METHYLPHENIDATE HCL ER 18 MG PO TB24: 18.0000 mg | ORAL_TABLET | Freq: Every day | ORAL | 0 refills | Status: DC

## 2018-10-23 NOTE — Telephone Encounter (Signed)
Patient called and said that she is having more panic attacks that are lasting a long time. She thinks this panic attacks are caused by the concerta 27 mg. Please give her a call back at [phone]

## 2018-10-23 NOTE — Telephone Encounter (Signed)
RTC Patient called and said that she is having more panic attacks that are lasting a long time. She thinks this panic attacks are caused by the concerta 27 mg..  Panic yesterday around this time and again today.  Seeimg pattern of after lunch and before dinner.  SOB, dread, very anxious. No excess caffeiene and only AM.  Reduce concerta to 18 daily bc it did help.  She agrees.  Meredith Staggers, MD, DFAPA

## 2018-11-18 ENCOUNTER — Telehealth: Payer: Self-pay | Admitting: Psychiatry

## 2018-11-18 ENCOUNTER — Other Ambulatory Visit: Payer: Self-pay

## 2018-11-18 MED ORDER — METHYLPHENIDATE HCL ER 18 MG PO TB24: 18.0000 mg | ORAL_TABLET | Freq: Every day | ORAL | 0 refills | Status: DC

## 2018-11-18 NOTE — Telephone Encounter (Signed)
Patient called and said that she needs a refill on her concerta 18 mg to be sent to the [facility] on [address]

## 2018-11-18 NOTE — Telephone Encounter (Signed)
Submitted

## 2018-12-01 ENCOUNTER — Ambulatory Visit: Payer: BLUE CROSS/BLUE SHIELD | Admitting: Psychiatry

## 2018-12-01 ENCOUNTER — Encounter: Payer: Self-pay | Admitting: Psychiatry

## 2018-12-01 ENCOUNTER — Other Ambulatory Visit: Payer: Self-pay

## 2018-12-01 DIAGNOSIS — F4010 Social phobia, unspecified: Secondary | ICD-10-CM | POA: Diagnosis not present

## 2018-12-01 DIAGNOSIS — F4001 Agoraphobia with panic disorder: Secondary | ICD-10-CM

## 2018-12-01 DIAGNOSIS — F331 Major depressive disorder, recurrent, moderate: Secondary | ICD-10-CM | POA: Diagnosis not present

## 2018-12-01 DIAGNOSIS — F900 Attention-deficit hyperactivity disorder, predominantly inattentive type: Secondary | ICD-10-CM

## 2018-12-01 DIAGNOSIS — F411 Generalized anxiety disorder: Secondary | ICD-10-CM

## 2018-12-01 MED ORDER — METHYLPHENIDATE HCL ER (OSM) 18 MG PO TBCR: 18.0000 mg | EXTENDED_RELEASE_TABLET | Freq: Every day | ORAL | 0 refills | Status: DC

## 2018-12-01 MED ORDER — METHYLPHENIDATE HCL ER 18 MG PO TB24: 18.0000 mg | ORAL_TABLET | Freq: Every day | ORAL | 0 refills | Status: DC

## 2018-12-01 NOTE — Progress Notes (Addendum)
[name] [id number] 01-31-1971 48 y.o.  Subjective:   Patient ID:  [name] is a 48 y.o. (DOB 1971-06-04) female.  Virtual Visit via Telephone Note  I connected with pt by telephone and verified that I am speaking with the correct person using two identifiers.   I discussed the limitations, risks, security and privacy concerns of performing an evaluation and management service by telephone and the availability of in person appointments. I also discussed with the patient that there may be a patient responsible charge related to this service. The patient expressed understanding and agreed to proceed.  I discussed the assessment and treatment plan with the patient. The patient was provided an opportunity to ask questions and all were answered. The patient agreed with the plan and demonstrated an understanding of the instructions.   The patient was advised to call back or seek an in-person evaluation if the symptoms worsen or if the condition fails to improve as anticipated.  I provided 30 minutes of non-face-to-face time during this encounter. The call started at 1035 and ended at 76. The patient was located at home and the provider was located at office.   Chief Complaint:  Chief Complaint  Patient presents with  . Follow-up    Medication Management  . Depression    Medication Management  . 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  Last appt Sep 02, 2018. Raised Concerta to 27 mg but then she had anxiety and couple panic and so it was reduced to 18 mg and anxiety resolved.  Overall pretty good mood.  Still tired.  Some anxiety with Covid.  Wasn't following thoughts to dark and scary places.  Loves being at home so ok. Getting things done and that feels really good.  Put off home schooling until July.  Better mood.  Bc  tremor she reduced lithium from 1200 to 900 on April 15 and the tremor is better not gone and feels the same.  No SI since here.  Still has to nap and rest at times with Concerta but it seemed to help, but hard to explain.  Clarity better.  Easier to make decisions.  Don't feel as daft.  Still hard listening to sermons, drift off all my life.  Is doing better with home schooling.  Still night eating.  Sleep 8-10 hours.  Overall pretty good but tired a lot.Pt reports that mood is much better and handling stress better. and describes anxiety as Minimal. Anxiety symptoms include: Excessive Worry,.  Mild irritability but doesn't lash out which is good.  One mild panic out of sleep.   Pt reports has interrupted sleep and eats in middle of night. Pt reports that appetite is good. Pt reports that energy is lethargic and good. Concentration is down slightly. Suicidal thoughts:  denied by patient.  Past Psychiatric Medication Trials: modafinil SE, Concerta, Loxitane 60, lamotrigine 300, modafinil 200, lithium, Seroquel XR 600, buspirone 30 twice daily, propranolol, gabapentin, topiramate, Abilify, Rexulti side effects, Depakote side effects, risperidone, imipramine, venlafaxine, sertraline, Lexapro20 nr, temazepam Deplin, Wellbutrin, Ritalin, selegiline, duloxetine side effects, imipramine, Relpax depression  Review of Systems:  Negative for weakness, + weight gain, trmor, connstipation  Medications: I have reviewed the patient's current medications.  Current Outpatient Medications  Medication Sig Dispense Refill  . Docusate Calcium (STOOL SOFTENER PO) Take by mouth 3 (three) times  daily.    . lamoTRIgine (LAMICTAL) 150 MG tablet TAKE ONE TABLET BY MOUTH TWO TIMES A DAY 60 tablet 2  . levothyroxine (SYNTHROID, LEVOTHROID) 125 MCG tablet Take 125 mcg by mouth daily before breakfast.     . lithium carbonate (LITHOBID) 300 MG CR tablet TAKE TWO TABLETS BY MOUTH TWICE A DAY (Patient taking differently: Take 900  mg by mouth at bedtime. ) 120 tablet 1  . Magnesium 200 MG TABS Take by mouth daily.    . methylphenidate 18 MG PO CR tablet Take 1 tablet (18 mg total) by mouth daily. 30 tablet 0  . methylphenidate 18 MG PO CR tablet Take 1 tablet (18 mg total) by mouth daily. 30 tablet 0  . [START ON 12/29/2018] methylphenidate 18 MG PO CR tablet Take 1 tablet (18 mg total) by mouth daily. 30 tablet 0  . [START ON 01/26/2019] methylphenidate 18 MG PO CR tablet Take 1 tablet (18 mg total) by mouth daily. 30 tablet 0  . Multiple Vitamins-Minerals (WOMENS MULTIVITAMIN PLUS PO) Take by mouth.    Marland Kitchen OVER THE COUNTER MEDICATION Take 600 mg by mouth. N-ACETYL cysteine    . PARoxetine (PAXIL) 30 MG tablet TAKE ONE TABLET BY MOUTH DAILY 30 tablet 4  . Polyethylene Glycol 3350 (MIRALAX PO) Take by mouth daily.    . QUEtiapine (SEROQUEL) 100 MG tablet TAKE 1 TABLET BY MOUTH AT BEDTIME 30 tablet 4  . verapamil (VERELAN PM) 180 MG 24 hr capsule Take 180 mg by mouth at bedtime.     No current facility-administered medications for this visit.     Medication Side Effects: Other: night eating may be related., tremor  Allergies:  Allergies  Allergen Reactions  . Perphenazine Anaphylaxis and Swelling    Throat swelling and facial muscle contractions    . Imipramine Other (See Comments)    Can't remember adverse effect  . Penicillins Rash  . Albuterol   . Codeine   . Other     Phenegran    Past Medical History:  Diagnosis Date  . Depression   . Hashimoto's disease   . Kidney stones   . Migraine     Family History  Problem Relation Age of Onset  . Depression Mother   . Rheum arthritis Sister     Social History   Socioeconomic History  . Marital status: Married    Spouse name: Not on file  . Number of children: Not on file  . Years of education: Not on file  . Highest education level: Not on file  Occupational History  . Not on file  Social Needs  . Financial resource strain: Not on file  . Food  insecurity:    Worry: Not on file    Inability: Not on file  . Transportation needs:    Medical: Not on file    Non-medical: Not on file  Tobacco Use  . Smoking status: Never Smoker  . Smokeless tobacco: Never Used  Substance and Sexual Activity  . Alcohol use: No  . Drug use: No  . Sexual activity: Not on file  Lifestyle  . Physical activity:    Days per week: Not on file    Minutes per session: Not on file  . Stress: Not on file  Relationships  . Social connections:    Talks on phone: Not on file    Gets together: Not on file    Attends religious service: Not on file    Active member of  club or organization: Not on file    Attends meetings of clubs or organizations: Not on file    Relationship status: Not on file  . Intimate partner violence:    Fear of current or ex partner: Not on file    Emotionally abused: Not on file    Physically abused: Not on file    Forced sexual activity: Not on file  Other Topics Concern  . Not on file  Social History Narrative  . Not on file    Past Medical History, Surgical history, Social history, and Family history were reviewed and updated as appropriate.   Please see review of systems for further details on the patient's review from today.   Objective:   Physical Exam:  There were no vitals taken for this visit.  Physical Exam Constitutional:      General: She is not in acute distress.    Appearance: She is well-developed.  Musculoskeletal:        General: No deformity.  Neurological:     Mental Status: She is alert and oriented to person, place, and time.     Motor: No tremor.     Coordination: Coordination normal.     Gait: Gait normal.  Psychiatric:        Attention and Perception: Attention normal. She is attentive.        Mood and Affect: Mood normal. Mood is not anxious or depressed. Affect is not labile, blunt, angry or inappropriate.        Speech: Speech normal.        Behavior: Behavior normal.        Thought  Content: Thought content normal. Thought content does not include homicidal or suicidal ideation. Thought content does not include homicidal or suicidal plan.        Cognition and Memory: Cognition normal.        Judgment: Judgment normal.     Comments: Insight and general self-awareness is moderately impaired chronically.     Lab Review:     Component Value Date/Time   NA 139 08/23/2013 1600   K 4.0 08/23/2013 1600   CL 101 08/23/2013 1600   CO2 27 08/23/2013 1600   GLUCOSE 111 (H) 08/23/2013 1600   BUN 12 08/23/2013 1600   CREATININE 0.85 08/23/2013 1600   CALCIUM 9.3 08/23/2013 1600   PROT 8.2 08/23/2013 1600   ALBUMIN 3.9 08/23/2013 1600   AST 18 08/23/2013 1600   ALT 14 08/23/2013 1600   ALKPHOS 72 08/23/2013 1600   BILITOT 0.3 08/23/2013 1600   GFRNONAA 84 (L) 08/23/2013 1600   GFRAA >90 08/23/2013 1600       Component Value Date/Time   WBC 6.4 08/23/2013 1600   RBC 4.78 08/23/2013 1600   HGB 12.7 08/23/2013 1600   HCT 39.3 08/23/2013 1600   PLT 249 08/23/2013 1600   MCV 82.2 08/23/2013 1600   MCH 26.6 08/23/2013 1600   MCHC 32.3 08/23/2013 1600   RDW 13.5 08/23/2013 1600   LYMPHSABS 2.1 01/17/2011 1812   MONOABS 0.7 01/17/2011 1812   EOSABS 0.1 01/17/2011 1812   BASOSABS 0.0 01/17/2011 1812    No results found for: POCLITH, LITHIUM   No results found for: PHENYTOIN, PHENOBARB, VALPROATE, CBMZ   .res Assessment: Plan:    Major depressive disorder, recurrent episode, moderate (HCC)  Panic disorder with agoraphobia  Social anxiety disorder  Generalized anxiety disorder  Attention deficit hyperactivity disorder (ADHD), predominantly inattentive type   Hx severe TRD with  Suicide attempt.  Insight and general self-awareness is moderately impaired chronically complicating and delaying treatment.  Residual problems with home schooling DT tired and not focused.  Didn't tolerate modafinil 100 DT anxiety.    She may have underlying ADD on top of the obvious  impairment from the depression.  Her mood is pretty well managed at the present which is remarkable in and of itself as she has been chronically depressed.  She still has problems with consistent productivity and the need to nap that seems excessive.  So she has a partial benefit from Concerta 18 mg a day  Overall her depression is improved and additional amounts since the Concerta was added.  She is more productive.  She is more positive and hopeful.  The highest tolerated dose is 18 mg.  27 mg caused anxiety.  We could consider the alternatives Vyvanse or other similar Adderall based stimulants.  We discussed the risk of it causing agitation and other sorts of psychiatric side effects and please let us know  Eat more protein daytime to see if it helps night eating.  Discussed potential metabolic side effects associated with atypical antipsychotics, as well as potential risk for movement side effects. Advised pt to contact office if movement side effects occur. Seroquel needed for sleep and mood.  But may be contributing to the night eating and constipation.  Constipation management 1.  Lots of water 2.  Powdered fiber supplement such as MiraLAX, Citrucel, etc. preferably with a meal 3.  2 stool softeners a day 4.  Milk of magnesia or magnesium tablets if needed  This appt was 30 mins.  FU 12 weeks.  Meredith Staggersarey Cottle, MD, DFAPA   Please see After Visit Summary for patient specific instructions.  No future appointments.  No orders of the defined types were placed in this encounter.     -------------------------------

## 2018-12-07 ENCOUNTER — Other Ambulatory Visit: Payer: Self-pay | Admitting: Psychiatry

## 2018-12-11 ENCOUNTER — Other Ambulatory Visit: Payer: Self-pay | Admitting: Psychiatry

## 2018-12-19 ENCOUNTER — Other Ambulatory Visit: Payer: Self-pay | Admitting: Psychiatry

## 2018-12-24 ENCOUNTER — Telehealth: Payer: Self-pay | Admitting: Psychiatry

## 2018-12-24 NOTE — Telephone Encounter (Signed)
Patient need a refill on Concerta ER 18 mg, tab to be sent to [facility] on [address].

## 2018-12-24 NOTE — Telephone Encounter (Signed)
She already has a June and July rx on file at [facility]

## 2019-01-12 DIAGNOSIS — E038 Other specified hypothyroidism: Secondary | ICD-10-CM | POA: Diagnosis not present

## 2019-01-12 DIAGNOSIS — E063 Autoimmune thyroiditis: Secondary | ICD-10-CM | POA: Diagnosis not present

## 2019-01-15 DIAGNOSIS — E038 Other specified hypothyroidism: Secondary | ICD-10-CM | POA: Diagnosis not present

## 2019-01-15 DIAGNOSIS — E063 Autoimmune thyroiditis: Secondary | ICD-10-CM | POA: Diagnosis not present

## 2019-01-17 DIAGNOSIS — Z1159 Encounter for screening for other viral diseases: Secondary | ICD-10-CM | POA: Diagnosis not present

## 2019-01-26 DIAGNOSIS — G43009 Migraine without aura, not intractable, without status migrainosus: Secondary | ICD-10-CM | POA: Diagnosis not present

## 2019-01-26 DIAGNOSIS — R400 Somnolence: Secondary | ICD-10-CM | POA: Diagnosis not present

## 2019-01-26 DIAGNOSIS — R5383 Other fatigue: Secondary | ICD-10-CM | POA: Diagnosis not present

## 2019-02-15 ENCOUNTER — Other Ambulatory Visit: Payer: Self-pay | Admitting: Psychiatry

## 2019-03-01 NOTE — Progress Notes (Signed)
NEUROLOGY CONSULTATION NOTE  [name] MRN: [id number] DOB: March 22, 1971  Referring provider: C. Melinda Crutch, MD Primary care provider: C. Melinda Crutch, MD  Reason for consult:  migraines  HISTORY OF PRESENT ILLNESS: [name] is a 48 year old right-handed female who presents for migraines.  History supplemented by prior neurologist and referring provider notes.  Onset:  teenager Location:  Mostly right-sided, sometimes left-sided or back of neck Quality:  Pressure, throbbing Intensity:  Usually 2-4/10.  Occasionally 8/10 (2 to 3 times a month).  She denies new headache or thunderclap headache Aura:  no Premonitory Phase:  A very vague mild headache or "presence" Postdrome: hangover effect/fatigue, scalp tenderness Associated symptoms:  Photophobia, phonophobia, nausea, tinnitus. She denies associated vomiting, visual disturbance, unilateral numbness or weakness. Duration:  Always a constant headache.  Severe migraines last 3 days. Frequency:  Constant headache.  Severe headaches 2-3 times a month, about 9-10 days total a month) Frequency of abortive medication: ibuprofen daily Triggers:  Emotional stress, menstrual cycle Relieving factors:  none Activity:  Severe aggravates  05/14/11 MRI Brain w/wo personally reviewed:  Normal 05/14/11 MRA Head personally reviewed:  Negative 01/12/18 CT head personally reviewed:  Normal 01/12/18 CT Cervical spine personally reviewed: mild generalized facet spurring with no evidence of cord impingement.  Current NSAIDS:  Ibuprofen 800mg  Current analgesics:  none Current triptans:  none Current ergotamine:  none Current anti-emetic:  none Current muscle relaxants:  none Current anti-anxiolytic:  none Current sleep aide:  Seroquel Current Antihypertensive medications:  Verapamil 24h 180mg  daily Current Antidepressant medications:  Paroxetine 30mg  Current Anticonvulsant medications:  lamotirigine 150mg  twice daily, Lithium Current  anti-CGRP:  none Current Vitamins/Herbal/Supplements:  Magnesium, B-complex, CoQ10 (in MVI) Current Antihistamines/Decongestants:  none Other therapy:  none Hormone/birth control:  none Other medications:  Methylphenidate, Synthroid  Past NSAIDS:  Naproxen, flurbiprofen Past steroids:  prednisone Past analgesics:  Stadol, tramadol, Tylenol, codeine, lorcet, percocet Past abortive triptans:  Sumatriptan tablet, Maxalt, Relpax, Zomig, Amerge Past abortive ergotamine:  None Past muscle relaxants:  none Past anti-emetic:  Phenergan, Zofran, Reglan Past antihypertensive medications:  propranolol Past antidepressant medications:  Imipramine, Zoloft, Wellbutrin, Cymbalta, Lexapro, Effexor Past anticonvulsant medications:  Topiramate, gabapentin Past anti-CGRP:  She was prescribed Emgality. Past vitamins/Herbal/Supplements:  none Past antihistamines/decongestants:  Dramamine, benadryl Other past therapies:  none  Caffeine:  1 glass of iced tea daily.  No coffee. Alcohol:  no Smoker:  no Diet:  Started a new diet last week. Exercise:  Not routine but working to use the eliptical Depression:  yes; Anxiety:  Not currently. Other pain:  Some aches and pains Sleep hygiene:  Good with Seroquel. Family history of headache:  Mom, sister Other family history:  Maternal GM (strokes)  PAST MEDICAL HISTORY: Past Medical History:  Diagnosis Date  . Depression   . Hashimoto's disease   . Kidney stones   . Migraine     PAST SURGICAL HISTORY: No past surgical history on file.  MEDICATIONS: Current Outpatient Medications on File Prior to Visit  Medication Sig Dispense Refill  . Docusate Calcium (STOOL SOFTENER PO) Take by mouth 3 (three) times daily.    Marland Kitchen lamoTRIgine (LAMICTAL) 150 MG tablet TAKE ONE TABLET BY MOUTH TWO TIMES A DAY 60 tablet 5  . levothyroxine (SYNTHROID, LEVOTHROID) 125 MCG tablet Take 125 mcg by mouth daily before breakfast.     . lithium carbonate (LITHOBID) 300 MG CR  tablet Take 3 tablets (900 mg total) by mouth at bedtime. 90 tablet 5  .  Magnesium 200 MG TABS Take by mouth daily.    . methylphenidate 18 MG PO CR tablet Take 1 tablet (18 mg total) by mouth daily. 30 tablet 0  . methylphenidate 18 MG PO CR tablet Take 1 tablet (18 mg total) by mouth daily. 30 tablet 0  . methylphenidate 18 MG PO CR tablet Take 1 tablet (18 mg total) by mouth daily. 30 tablet 0  . methylphenidate 18 MG PO CR tablet Take 1 tablet (18 mg total) by mouth daily. 30 tablet 0  . Multiple Vitamins-Minerals (WOMENS MULTIVITAMIN PLUS PO) Take by mouth.    Marland Kitchen. OVER THE COUNTER MEDICATION Take 600 mg by mouth. N-ACETYL cysteine    . PARoxetine (PAXIL) 30 MG tablet TAKE ONE TABLET BY MOUTH DAILY 30 tablet 3  . Polyethylene Glycol 3350 (MIRALAX PO) Take by mouth daily.    . QUEtiapine (SEROQUEL) 100 MG tablet TAKE ONE TABLET BY MOUTH AT BEDTIME 30 tablet 5  . verapamil (VERELAN PM) 180 MG 24 hr capsule Take 180 mg by mouth at bedtime.     No current facility-administered medications on file prior to visit.     ALLERGIES: Allergies  Allergen Reactions  . Perphenazine Anaphylaxis and Swelling    Throat swelling and facial muscle contractions    . Imipramine Other (See Comments)    Can't remember adverse effect  . Penicillins Rash  . Albuterol   . Codeine   . Other     Phenegran    FAMILY HISTORY: Family History  Problem Relation Age of Onset  . Depression Mother   . Rheum arthritis Sister     SOCIAL HISTORY: Social History   Socioeconomic History  . Marital status: Married    Spouse name: Not on file  . Number of children: Not on file  . Years of education: Not on file  . Highest education level: Not on file  Occupational History  . Not on file  Social Needs  . Financial resource strain: Not on file  . Food insecurity    Worry: Not on file    Inability: Not on file  . Transportation needs    Medical: Not on file    Non-medical: Not on file  Tobacco Use  .  Smoking status: Never Smoker  . Smokeless tobacco: Never Used  Substance and Sexual Activity  . Alcohol use: No  . Drug use: No  . Sexual activity: Not on file  Lifestyle  . Physical activity    Days per week: Not on file    Minutes per session: Not on file  . Stress: Not on file  Relationships  . Social Musicianconnections    Talks on phone: Not on file    Gets together: Not on file    Attends religious service: Not on file    Active member of club or organization: Not on file    Attends meetings of clubs or organizations: Not on file    Relationship status: Not on file  . Intimate partner violence    Fear of current or ex partner: Not on file    Emotionally abused: Not on file    Physically abused: Not on file    Forced sexual activity: Not on file  Other Topics Concern  . Not on file  Social History Narrative  . Not on file    REVIEW OF SYSTEMS: Constitutional: No fevers, chills, or sweats, no generalized fatigue, change in appetite Eyes: No visual changes, double vision, eye pain Ear, nose  and throat: No hearing loss, ear pain, nasal congestion, sore throat Cardiovascular: No chest pain, palpitations Respiratory:  No shortness of breath at rest or with exertion, wheezes GastrointestinaI: No nausea, vomiting, diarrhea, abdominal pain, fecal incontinence Genitourinary:  No dysuria, urinary retention or frequency Musculoskeletal:  No neck pain, back pain Integumentary: No rash, pruritus, skin lesions Neurological: as above Psychiatric: No depression, insomnia, anxiety Endocrine: No palpitations, fatigue, diaphoresis, mood swings, change in appetite, change in weight, increased thirst Hematologic/Lymphatic:  No purpura, petechiae. Allergic/Immunologic: no itchy/runny eyes, nasal congestion, recent allergic reactions, rashes  PHYSICAL EXAM: Blood pressure 129/83, pulse 69, temperature 99.3 F (37.4 C), temperature source Oral, height 5\' 3"  (1.6 m), weight 152 lb (68.9 kg), SpO2  98 %. General: No acute distress.  Patient appears well-groomed.  Head:  Normocephalic/atraumatic Eyes:  fundi examined but not visualized Neck: supple, no paraspinal tenderness, full range of motion Back: No paraspinal tenderness Heart: regular rate and rhythm Lungs: Clear to auscultation bilaterally. Vascular: No carotid bruits. Neurological Exam: Mental status: alert and oriented to person, place, and time, recent and remote memory intact, fund of knowledge intact, attention and concentration intact, speech fluent and not dysarthric, language intact. Cranial nerves: CN I: not tested CN II: pupils equal, round and reactive to light, visual fields intact CN III, IV, VI:  full range of motion, no nystagmus, no ptosis CN V: facial sensation intact CN VII: upper and lower face symmetric CN VIII: hearing intact CN IX, X: gag intact, uvula midline CN XI: sternocleidomastoid and trapezius muscles intact CN XII: tongue midline Bulk & Tone: normal, no fasciculations. Motor:  5/5 throughout  Sensation:  temperature and vibration sensation intact. Deep Tendon Reflexes:  2+ throughout, toes downgoing.  Finger to nose testing:  Without dysmetria.   Heel to shin:  Without dysmetria.   Gait:  Normal station and stride.  Romberg negative.  IMPRESSION: Chronic migraine without aura, without status migrainosus, intractable. We discussed treatment options.   She has daily migraines for well over 3 months and has failed multiple preventative medications.  She meets criteria for Botox.  She would rather avoid trying an anti-CGRP due to concerns of possible side effects.    PLAN: 1.  For preventative management, Botox 2.  For abortive therapy, Ubrelvy 100mg  3.  Stop ibuprofen.  Limit use of pain relievers to no more than 2 days out of week to prevent risk of rebound or medication-overuse headache. 4.  Keep headache diary 5.  Exercise, hydration, caffeine cessation, sleep hygiene, monitor for and  avoid triggers 6.  Consider:  magnesium citrate 400mg  daily, riboflavin 400mg  daily, and coenzyme Q10 100mg  three times daily 7. Always keep in mind that currently taking a hormone or birth control may be a possible trigger or aggravating factor for migraine. 8. Follow up for Botox   Thank you for allowing me to take part in the care of this patient.  Shon MilletAdam Elaynah Virginia, DO  CC: C. Duane LopeAlan Ross, MD

## 2019-03-02 ENCOUNTER — Other Ambulatory Visit: Payer: Self-pay

## 2019-03-02 ENCOUNTER — Encounter: Payer: Self-pay | Admitting: Neurology

## 2019-03-02 ENCOUNTER — Ambulatory Visit (INDEPENDENT_AMBULATORY_CARE_PROVIDER_SITE_OTHER): Payer: BC Managed Care – PPO | Admitting: Neurology

## 2019-03-02 VITALS — BP 129/83 | HR 69 | Temp 99.3°F | Ht 63.0 in | Wt 152.0 lb

## 2019-03-02 DIAGNOSIS — G43709 Chronic migraine without aura, not intractable, without status migrainosus: Secondary | ICD-10-CM

## 2019-03-02 MED ORDER — UBRELVY 100 MG PO TABS: 100.0000 mg | ORAL_TABLET | ORAL | 0 refills | Status: DC

## 2019-03-02 MED ORDER — UBRELVY 100 MG PO TABS: 1.0000 | ORAL_TABLET | ORAL | 2 refills | Status: DC | PRN

## 2019-03-02 NOTE — Patient Instructions (Signed)
Migraine Recommendations: 1.  Will submit prior authorization for Botox 2.  Take Ubrelvy 100mg  at earliest onset of headache.  May repeat dose once in 2 hours if needed.  Do not exceed two tablets in 24 hours. 3.  STOP IBUPROFEN.  Rule is to limit use of pain relievers to no more than 2 days out of the week.  These medications include acetaminophen, ibuprofen, triptans and narcotics.  This will help reduce risk of rebound headaches. 4.  Be aware of common food triggers such as processed sweets, processed foods with nitrites (such as deli meat, hot dogs, sausages), foods with MSG, alcohol (such as wine), chocolate, certain cheeses, certain fruits (dried fruits, bananas, pineapple), vinegar, diet soda. 4.  Avoid caffeine 5.  Routine exercise 6.  Proper sleep hygiene 7.  Stay adequately hydrated with water 8.  Keep a headache diary. 9.  Maintain proper stress management. 10.  Do not skip meals. 11.  Consider supplements:  Magnesium citrate 400mg  to 600mg  daily, riboflavin 400mg , Coenzyme Q 10 100mg  three times daily

## 2019-03-03 ENCOUNTER — Ambulatory Visit: Payer: BLUE CROSS/BLUE SHIELD | Admitting: Psychiatry

## 2019-03-09 DIAGNOSIS — M9903 Segmental and somatic dysfunction of lumbar region: Secondary | ICD-10-CM | POA: Diagnosis not present

## 2019-03-09 DIAGNOSIS — M542 Cervicalgia: Secondary | ICD-10-CM | POA: Diagnosis not present

## 2019-03-09 DIAGNOSIS — M545 Low back pain: Secondary | ICD-10-CM | POA: Diagnosis not present

## 2019-03-09 DIAGNOSIS — M9901 Segmental and somatic dysfunction of cervical region: Secondary | ICD-10-CM | POA: Diagnosis not present

## 2019-03-11 DIAGNOSIS — M9901 Segmental and somatic dysfunction of cervical region: Secondary | ICD-10-CM | POA: Diagnosis not present

## 2019-03-11 DIAGNOSIS — M545 Low back pain: Secondary | ICD-10-CM | POA: Diagnosis not present

## 2019-03-11 DIAGNOSIS — M542 Cervicalgia: Secondary | ICD-10-CM | POA: Diagnosis not present

## 2019-03-11 DIAGNOSIS — M9903 Segmental and somatic dysfunction of lumbar region: Secondary | ICD-10-CM | POA: Diagnosis not present

## 2019-03-15 DIAGNOSIS — M545 Low back pain: Secondary | ICD-10-CM | POA: Diagnosis not present

## 2019-03-15 DIAGNOSIS — M542 Cervicalgia: Secondary | ICD-10-CM | POA: Diagnosis not present

## 2019-03-15 DIAGNOSIS — M9901 Segmental and somatic dysfunction of cervical region: Secondary | ICD-10-CM | POA: Diagnosis not present

## 2019-03-15 DIAGNOSIS — M9903 Segmental and somatic dysfunction of lumbar region: Secondary | ICD-10-CM | POA: Diagnosis not present

## 2019-03-18 ENCOUNTER — Encounter: Payer: Self-pay | Admitting: *Deleted

## 2019-03-18 DIAGNOSIS — M542 Cervicalgia: Secondary | ICD-10-CM | POA: Diagnosis not present

## 2019-03-18 DIAGNOSIS — M545 Low back pain: Secondary | ICD-10-CM | POA: Diagnosis not present

## 2019-03-18 DIAGNOSIS — M9903 Segmental and somatic dysfunction of lumbar region: Secondary | ICD-10-CM | POA: Diagnosis not present

## 2019-03-18 DIAGNOSIS — M9901 Segmental and somatic dysfunction of cervical region: Secondary | ICD-10-CM | POA: Diagnosis not present

## 2019-03-18 NOTE — Progress Notes (Signed)
[name] (Key: [id number]) - SRR  Need help? Call us at [phone]    Status  Sent to [employer]  Next Steps  The plan will fax you a determination, typically within 1 to 5 business days. How do I follow up?  DrugBotox 200UNIT solution  FormBlue Cross [place] Botulinum Toxin Injection FormPrior Authorization for Botox [phone] phone

## 2019-03-18 NOTE — Progress Notes (Addendum)
[name] (Key: [id number])  Rx #: [id number]  [name] 100MG  tablets  Form [employer] [place] Electronic Request Form (CB)  Created  4 days ago  Sent to Plan  4 days ago  Plan Response  4 days ago  Submit Clinical Questions  4 days ago  Determination  Favorable  4 days ago  Message from Plan Effective from 03/18/2019 through 06/09/2019. initial

## 2019-03-19 ENCOUNTER — Telehealth: Payer: Self-pay | Admitting: Neurology

## 2019-03-19 NOTE — Telephone Encounter (Signed)
[name] 100mg  PA was approved through [employer]. Just FYI. Thanks!

## 2019-03-22 ENCOUNTER — Telehealth: Payer: Self-pay | Admitting: *Deleted

## 2019-03-22 DIAGNOSIS — M542 Cervicalgia: Secondary | ICD-10-CM | POA: Diagnosis not present

## 2019-03-22 DIAGNOSIS — M545 Low back pain: Secondary | ICD-10-CM | POA: Diagnosis not present

## 2019-03-22 DIAGNOSIS — M9901 Segmental and somatic dysfunction of cervical region: Secondary | ICD-10-CM | POA: Diagnosis not present

## 2019-03-22 DIAGNOSIS — M9903 Segmental and somatic dysfunction of lumbar region: Secondary | ICD-10-CM | POA: Diagnosis not present

## 2019-03-22 NOTE — Telephone Encounter (Signed)
Called patient and confirmed she was aware of the appt time change to 12/28 at 0950 and that her botox appt was cancelled. Patient verbalized understanding.   Also informed patient that Dr. Tomi Likens stated she should try her Emgality  for headaches. Patient does have this medication and stated she would start to use it.  Dr. Tomi Likens - patient did want to ask you what should she take for a headache (that is not bad enough for Emgality OR if she has already had maximum doses of Emgality)  if she can't take ibuprofen or tylenol.

## 2019-03-22 NOTE — Progress Notes (Addendum)
Denied. CovermyMeds cancelled the request. BCBS faxed a document :This drug is used to treat Chronic Migraine when: Essentially you have tried a CGRP blocker such as Emgality, Ajovy or Aimovig and it did not work.  It was sent to scan.  I will call Ms. Duris and let the front desk know to cancel her appt.  Also I placed on Dr. Georgie Chard desk to see if he wants to appeal. I will let her know once I hear back from Dr. Tomi Likens.

## 2019-03-23 ENCOUNTER — Telehealth: Payer: Self-pay | Admitting: Neurology

## 2019-03-23 NOTE — Telephone Encounter (Signed)
Patient has a really bad headache and is wanting to know what she can take for the pain. Please call her back. If calling in anything medication wise her pharm on file is correct. She said she was denied Botox.

## 2019-03-23 NOTE — Telephone Encounter (Signed)
See phone message 8/25 1700. Patient aware of [name] order and know it is now covered by [facility].

## 2019-03-23 NOTE — Telephone Encounter (Signed)
Then we can prescribe her Emgality

## 2019-03-23 NOTE — Telephone Encounter (Signed)
I had prescribed her [name] 100mg 

## 2019-03-23 NOTE — Telephone Encounter (Signed)
Spoke to patient today  ( also see prior telephone note yesterday 8/24 regarding her headache meds).. Per the chart [employer] has now approved her Ubrelvy 100mg  PA.  Informed her of that and she is going to go get it filled. When she told me yesterday she had Emgality she said realized today she was saying the wrong name (meant [name]). She does NOT have a script for Emgality   She is interested in getting something ordered for preventative management as Botox which was the plan at 8/4 office visit has been denied. Informed her will make MD aware.

## 2019-03-24 ENCOUNTER — Other Ambulatory Visit: Payer: Self-pay | Admitting: Neurology

## 2019-03-24 ENCOUNTER — Encounter: Payer: Self-pay | Admitting: *Deleted

## 2019-03-24 MED ORDER — EMGALITY 120 MG/ML ~~LOC~~ SOAJ: 240.0000 mg | Freq: Once | SUBCUTANEOUS | 0 refills | Status: AC

## 2019-03-24 MED ORDER — EMGALITY 120 MG/ML ~~LOC~~ SOAJ: 120.0000 mg | SUBCUTANEOUS | 11 refills | Status: DC

## 2019-03-24 NOTE — Progress Notes (Addendum)
[name] (Key: [id number]) Rx #: [id number] Emgality 120MG /ML auto-injectors (migraine)   Form Blue Building control surveyor Form (CB) Created 3 hours ago Sent to Plan 2 hours ago Plan Response 2 hours ago Submit Clinical Questions 2 hours ago Determination Favorable 2 hours ago Message from Plan Effective from 03/24/2019 through 06/21/2019.[name] (Key: [id number]) Rx #: [id number] Emgality 120MG /ML auto-injectors (migraine)

## 2019-03-24 NOTE — Progress Notes (Signed)
I sent prescription for Emgality to [facility]:  First dose involves two 120mg  injections (total 240mg ) Then in 30 days, she starts taking one 120mg  every 30 days.

## 2019-03-25 DIAGNOSIS — M9901 Segmental and somatic dysfunction of cervical region: Secondary | ICD-10-CM | POA: Diagnosis not present

## 2019-03-25 DIAGNOSIS — M542 Cervicalgia: Secondary | ICD-10-CM | POA: Diagnosis not present

## 2019-03-25 DIAGNOSIS — M545 Low back pain: Secondary | ICD-10-CM | POA: Diagnosis not present

## 2019-03-25 DIAGNOSIS — M9903 Segmental and somatic dysfunction of lumbar region: Secondary | ICD-10-CM | POA: Diagnosis not present

## 2019-03-26 ENCOUNTER — Ambulatory Visit: Payer: BC Managed Care – PPO | Admitting: Neurology

## 2019-04-01 ENCOUNTER — Encounter: Payer: Self-pay | Admitting: *Deleted

## 2019-04-01 NOTE — Progress Notes (Signed)
Persephanie Sousa (Key: [id number]) Emgality 120MG /ML auto-injectors (migraine)   Form Blue Building control surveyor Form (CB) Created 17 minutes ago Sent to Plan 11 minutes ago Plan Response 11 minutes ago Submit Clinical Questions less than a minute ago Determination Wait for Determination Please wait for [employer] [place] [facility] to return a determination.

## 2019-04-07 DIAGNOSIS — Z79899 Other long term (current) drug therapy: Secondary | ICD-10-CM | POA: Diagnosis not present

## 2019-04-07 DIAGNOSIS — M9901 Segmental and somatic dysfunction of cervical region: Secondary | ICD-10-CM | POA: Diagnosis not present

## 2019-04-07 DIAGNOSIS — M542 Cervicalgia: Secondary | ICD-10-CM | POA: Diagnosis not present

## 2019-04-07 DIAGNOSIS — E559 Vitamin D deficiency, unspecified: Secondary | ICD-10-CM | POA: Diagnosis not present

## 2019-04-07 DIAGNOSIS — M545 Low back pain: Secondary | ICD-10-CM | POA: Diagnosis not present

## 2019-04-07 DIAGNOSIS — G43009 Migraine without aura, not intractable, without status migrainosus: Secondary | ICD-10-CM | POA: Diagnosis not present

## 2019-04-07 DIAGNOSIS — M9903 Segmental and somatic dysfunction of lumbar region: Secondary | ICD-10-CM | POA: Diagnosis not present

## 2019-04-07 NOTE — Progress Notes (Signed)
NEUROLOGY FOLLOW UP OFFICE NOTE  CLARIE CAMEY [id number]  HISTORY OF PRESENT ILLNESS: Aryssa Rosamond is a 48 year old right-handed female who follows up for migraines.  UPDATE: The plan was to start Ms. Schoenfeld on Botox, as she has chronic migraines and has failed other preventatives such as topiramate, propranolol, imipramine, venlafaxine and verapamil.  However, her insurance denied coverage for Botox and requested that she tray an anti-CGRP first.  She has not started [employer] because insurance would not cover it.  She tried [place] but it was ineffective and caused paresthesias for 18 hours.  Current NSAIDS:  Ibuprofen 800mg  Current analgesics:  none Current triptans:  none Current ergotamine:  none Current anti-emetic:  none Current muscle relaxants:  none Current anti-anxiolytic:  none Current sleep aide:  Seroquel Current Antihypertensive medications:  Verapamil 24h 180mg  daily Current Antidepressant medications:  Paroxetine 30mg  Current Anticonvulsant medications:  lamotirigine 150mg  twice daily, Lithium Current anti-CGRP:  Emgality; Ubrelvy  Current Vitamins/Herbal/Supplements:  Magnesium, B-complex, CoQ10 (in MVI) Current Antihistamines/Decongestants:  none Other therapy:  none Hormone/birth control:  none Other medications:  Methylphenidate, Synthroid  Caffeine:  1 glass of iced tea daily.  No coffee. Alcohol:  no Smoker:  no Diet:  Started a new diet last week. Exercise:  Not routine but working to use the eliptical Depression:  yes; Anxiety:  Not currently. Other pain:  Some aches and pains Sleep hygiene:  Good with Seroquel.  HISTORY: Onset:  teenager Location:  Mostly right-sided, sometimes left-sided or back of neck Quality:  Pressure, throbbing Intensity:  Usually 2-4/10.  Occasionally 8/10 (2 to 3 times a month).  She denies new headache or thunderclap headache Aura:  no Premonitory Phase:  A very vague mild headache or "presence"  Postdrome: hangover effect/fatigue, scalp tenderness Associated symptoms:  Photophobia, phonophobia, nausea, tinnitus. She denies associated vomiting, visual disturbance, unilateral numbness or weakness. Duration:  Always a constant headache.  Severe migraines last 3 days. Frequency:  Constant headache.  Severe headaches 2-3 times a month, about 9-10 days total a month) Frequency of abortive medication: ibuprofen daily Triggers:  Emotional stress, menstrual cycle Relieving factors:  none Activity:  Severe aggravates  05/14/11 MRI Brain w/wo personally reviewed:  Normal 05/14/11 MRA Head personally reviewed:  Negative 01/12/18 CT head personally reviewed:  Normal 01/12/18 CT Cervical spine personally reviewed: mild generalized facet spurring with no evidence of cord impingement.  Past NSAIDS:  Naproxen, flurbiprofen Past steroids:  prednisone Past analgesics:  Stadol, tramadol, Tylenol, codeine, lorcet, percocet Past abortive triptans:  Sumatriptan tablet, Maxalt, Relpax, Zomig, Amerge Past abortive ergotamine:  None Past muscle relaxants:  none Past anti-emetic:  Phenergan, Zofran, Reglan Past antihypertensive medications:  propranolol Past antidepressant medications:  Imipramine, Zoloft, Wellbutrin, Cymbalta, Lexapro, Effexor Past anticonvulsant medications:  Topiramate, gabapentin Past anti-CGRP:  She was prescribed Emgality. Past vitamins/Herbal/Supplements:  none Past antihistamines/decongestants:  Dramamine, benadryl Other past therapies:  none   Family history of headache:  Mom, sister Other family history:  Maternal GM (strokes)  PAST MEDICAL HISTORY: Past Medical History:  Diagnosis Date  . Depression   . Hashimoto's disease   . Kidney stones   . Migraine     MEDICATIONS: Current Outpatient Medications on File Prior to Visit  Medication Sig Dispense Refill  . Acetylcysteine 600 MG CAPS Take 600 mg by mouth 2 (two) times daily.    Mariane Baumgarten Calcium (STOOL  SOFTENER PO) Take by mouth 3 (three) times daily.    . Galcanezumab-gnlm (EMGALITY) 120 MG/ML SOAJ  Inject 120 mg into the skin every 30 (thirty) days. 1 pen 11  . lamoTRIgine (LAMICTAL) 150 MG tablet TAKE ONE TABLET BY MOUTH TWO TIMES A DAY 60 tablet 5  . levothyroxine (SYNTHROID, LEVOTHROID) 125 MCG tablet Take 125 mcg by mouth daily before breakfast.     . lithium carbonate (LITHOBID) 300 MG CR tablet Take 3 tablets (900 mg total) by mouth at bedtime. 90 tablet 5  . Magnesium 200 MG TABS Take by mouth daily.    . methylphenidate 18 MG PO CR tablet Take 1 tablet (18 mg total) by mouth daily. 30 tablet 0  . methylphenidate 18 MG PO CR tablet Take 1 tablet (18 mg total) by mouth daily. (Patient not taking: Reported on 03/02/2019) 30 tablet 0  . methylphenidate 18 MG PO CR tablet Take 1 tablet (18 mg total) by mouth daily. (Patient not taking: Reported on 03/02/2019) 30 tablet 0  . methylphenidate 18 MG PO CR tablet Take 1 tablet (18 mg total) by mouth daily. (Patient not taking: Reported on 03/02/2019) 30 tablet 0  . Multiple Vitamins-Minerals (WOMENS MULTIVITAMIN PLUS PO) Take by mouth.    Marland Kitchen OVER THE COUNTER MEDICATION Take 600 mg by mouth. N-ACETYL cysteine    . PARoxetine (PAXIL) 30 MG tablet TAKE ONE TABLET BY MOUTH DAILY 30 tablet 3  . Polyethylene Glycol 3350 (MIRALAX PO) Take by mouth daily.    . QUEtiapine (SEROQUEL) 100 MG tablet TAKE ONE TABLET BY MOUTH AT BEDTIME 30 tablet 5  . Ubrogepant (UBRELVY) 100 MG TABS Take 1 tablet by mouth as needed (May repeat dose after 2 hours if needed.  Maximum 2 tablets in 24 hours.). 16 tablet 2  . Ubrogepant (UBRELVY) 100 MG TABS Take 100 mg by mouth as directed. May repeat in 2 hrs PRN. Max 200 mg in 24 hrs. 4 tablet 0  . verapamil (VERELAN PM) 180 MG 24 hr capsule Take 180 mg by mouth at bedtime.     No current facility-administered medications on file prior to visit.     ALLERGIES: Allergies  Allergen Reactions  . Perphenazine Anaphylaxis and  Swelling    Throat swelling and facial muscle contractions    . Imipramine Other (See Comments)    Can't remember adverse effect  . Penicillins Rash  . Albuterol   . Codeine   . Other     Phenegran    FAMILY HISTORY: Family History  Problem Relation Age of Onset  . Depression Mother   . Migraines Mother   . Obesity Mother   . Rheum arthritis Sister   . Migraines Sister   . Hypertension Sister   .  SOCIAL HISTORY: Social History   Socioeconomic History  . Marital status: Married    Spouse name: Acupuncturist  . Number of children: 4  . Years of education: 58  . Highest education level: Some college, no degree  Occupational History  . 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 Financial resource strain: Not on file  . Food insecurity    Worry: Not on file    Inability: Not on file  . Transportation needs    Medical: Not on file    Non-medical: Not on file  Tobacco Use  . Smoking status: Never Smoker  . Smokeless tobacco: Never Used  Substance and Sexual Activity  . Alcohol use: No  . Drug use: No  . Sexual activity: Yes    Partners: Male  Birth control/protection: Other-see comments    Comment: husband-vasectomy  Lifestyle  . Physical activity    Days per week: Not on file    Minutes per session: Not on file  . Stress: Not on file  Relationships  . Social Musicianconnections    Talks on phone: Not on file    Gets together: Not on file    Attends religious service: Not on file    Active member of club or organization: Not on file    Attends meetings of clubs or organizations: Not on file    Relationship status: Not on file  . Intimate partner violence    Fear of current or ex partner: Not on file    Emotionally abused: Not on file    Physically abused: Not on file    Forced sexual activity: Not on file  Other Topics Concern  . Not on file  Social History Narrative   Patient is right-handed. She lives with her husband and 4 children  in a one level home. She drinks one glass of tea a day. She does not exercise.    REVIEW OF SYSTEMS: Constitutional: No fevers, chills, or sweats, no generalized fatigue, change in appetite Eyes: No visual changes, double vision, eye pain Ear, nose and throat: No hearing loss, ear pain, nasal congestion, sore throat Cardiovascular: No chest pain, palpitations Respiratory:  No shortness of breath at rest or with exertion, wheezes GastrointestinaI: No nausea, vomiting, diarrhea, abdominal pain, fecal incontinence Genitourinary:  No dysuria, urinary retention or frequency Musculoskeletal:  No neck pain, back pain Integumentary: No rash, pruritus, skin lesions Neurological: as above Psychiatric: No depression, insomnia, anxiety Endocrine: No palpitations, fatigue, diaphoresis, mood swings, change in appetite, change in weight, increased thirst Hematologic/Lymphatic:  No purpura, petechiae. Allergic/Immunologic: no itchy/runny eyes, nasal congestion, recent allergic reactions, rashes  PHYSICAL EXAM: Blood pressure 127/82, pulse 76, temperature 98.3 F (36.8 C), height 5\' 3"  (1.6 m), weight 154 lb 12.8 oz (70.2 kg), SpO2 94 %. General: No acute distress.  Patient appears well-groomed.   Head:  Normocephalic/atraumatic   IMPRESSION: Chronic migraine without aura, without status migrainosus, intractable  PLAN: 1.  For preventative management, we will proceed Emgality.  The prior authorization didn't yet go through 2.  For abortive therapy, she will stop Ubrelvy and instead try Nurtec (samples provided).  If effective, she will contact us for a prescription. 3.  Limit use of pain relievers to no more than 2 days out of week to prevent risk of rebound or medication-overuse headache. 4.  Keep headache diary 5.  Exercise, hydration, caffeine cessation, sleep hygiene, monitor for and avoid triggers 6.  Consider:  magnesium citrate 400mg  daily, riboflavin 400mg  daily, and coenzyme Q10 100mg   three times daily 7. Always keep in mind that currently taking a hormone or birth control may be a possible trigger or aggravating factor for migraine. 8. Follow up 4 months.  15 minutes spent face to face with patient, 100% spent discussing management.  Shon MilletAdam Ilana Prezioso, DO  CC: C Duane LopeAlan Ross, MD

## 2019-04-07 NOTE — Progress Notes (Signed)
Received letter from [facility] stating the starter dose was included in the authorization  The letter was sent to scan into her chart

## 2019-04-09 ENCOUNTER — Encounter: Payer: Self-pay | Admitting: Neurology

## 2019-04-09 ENCOUNTER — Other Ambulatory Visit: Payer: Self-pay

## 2019-04-09 ENCOUNTER — Telehealth: Payer: Self-pay | Admitting: Neurology

## 2019-04-09 ENCOUNTER — Ambulatory Visit: Payer: BC Managed Care – PPO | Admitting: Neurology

## 2019-04-09 VITALS — BP 127/82 | HR 76 | Temp 98.3°F | Ht 63.0 in | Wt 154.8 lb

## 2019-04-09 DIAGNOSIS — G43719 Chronic migraine without aura, intractable, without status migrainosus: Secondary | ICD-10-CM

## 2019-04-09 MED ORDER — NURTEC 75 MG PO TBDP: 75.0000 mg | ORAL_TABLET | Freq: Once | ORAL | 0 refills | Status: AC

## 2019-04-09 NOTE — Telephone Encounter (Signed)
Patient called requesting to speak with the nurse about her Emgality, she needs to review the directions.

## 2019-04-09 NOTE — Addendum Note (Signed)
Addended by: Jesse Fall on: 04/09/2019 04:58 PM   Modules accepted: Orders

## 2019-04-09 NOTE — Telephone Encounter (Signed)
Called patient and she picked up her first script of Emgality . Order written by Dr. Tomi Likens on 8/26 was Emgality 120mg  (2 injections for initial loading dose total of 240mg ). Patient went to [facility] pharmacy and was just given one 120mg .   Call placed to [facility] to talk to pharmacist. They stated they only had one box today (120mg ) and will receive another shipment on Monday. They are aware that patient's order was for the 2 (120mg ) doses and the patient can call early next week to go pick up her 2nd one for loading dose.  Called Chaye and informed her of above. Told her NOT to use the first injection until she has both. She will call the pharmacy prior to going to make sure they have it. Verbalized understanding.

## 2019-04-09 NOTE — Patient Instructions (Addendum)
1.  We will proceed with Emgality. 2.  Stop Ubrelvy.  When you get a severe migraine, take Nurtec 75mg  tablet.  You can only take 1 in 24 hours.  If effective, contact me and we can send in a prescription. 3.  Limit use of pain relievers to no more than 2 days out of week to prevent risk of rebound or medication-overuse headache. 4.  Keep headache diary 5.  No caffeine, increase water intake, routine exercise. 6.  Follow up in 4 months.

## 2019-04-12 DIAGNOSIS — M9901 Segmental and somatic dysfunction of cervical region: Secondary | ICD-10-CM | POA: Diagnosis not present

## 2019-04-12 DIAGNOSIS — M9903 Segmental and somatic dysfunction of lumbar region: Secondary | ICD-10-CM | POA: Diagnosis not present

## 2019-04-12 DIAGNOSIS — M542 Cervicalgia: Secondary | ICD-10-CM | POA: Diagnosis not present

## 2019-04-12 DIAGNOSIS — M545 Low back pain: Secondary | ICD-10-CM | POA: Diagnosis not present

## 2019-04-19 DIAGNOSIS — M9901 Segmental and somatic dysfunction of cervical region: Secondary | ICD-10-CM | POA: Diagnosis not present

## 2019-04-19 DIAGNOSIS — M542 Cervicalgia: Secondary | ICD-10-CM | POA: Diagnosis not present

## 2019-04-19 DIAGNOSIS — M9903 Segmental and somatic dysfunction of lumbar region: Secondary | ICD-10-CM | POA: Diagnosis not present

## 2019-04-19 DIAGNOSIS — M545 Low back pain: Secondary | ICD-10-CM | POA: Diagnosis not present

## 2019-04-26 DIAGNOSIS — M542 Cervicalgia: Secondary | ICD-10-CM | POA: Diagnosis not present

## 2019-04-26 DIAGNOSIS — M545 Low back pain: Secondary | ICD-10-CM | POA: Diagnosis not present

## 2019-04-26 DIAGNOSIS — M9901 Segmental and somatic dysfunction of cervical region: Secondary | ICD-10-CM | POA: Diagnosis not present

## 2019-04-26 DIAGNOSIS — M9903 Segmental and somatic dysfunction of lumbar region: Secondary | ICD-10-CM | POA: Diagnosis not present

## 2019-05-10 DIAGNOSIS — M542 Cervicalgia: Secondary | ICD-10-CM | POA: Diagnosis not present

## 2019-05-10 DIAGNOSIS — M9901 Segmental and somatic dysfunction of cervical region: Secondary | ICD-10-CM | POA: Diagnosis not present

## 2019-05-10 DIAGNOSIS — M9903 Segmental and somatic dysfunction of lumbar region: Secondary | ICD-10-CM | POA: Diagnosis not present

## 2019-05-10 DIAGNOSIS — M545 Low back pain: Secondary | ICD-10-CM | POA: Diagnosis not present

## 2019-05-11 DIAGNOSIS — R05 Cough: Secondary | ICD-10-CM | POA: Diagnosis not present

## 2019-05-11 DIAGNOSIS — J301 Allergic rhinitis due to pollen: Secondary | ICD-10-CM | POA: Diagnosis not present

## 2019-05-11 DIAGNOSIS — K219 Gastro-esophageal reflux disease without esophagitis: Secondary | ICD-10-CM | POA: Diagnosis not present

## 2019-05-11 DIAGNOSIS — J3089 Other allergic rhinitis: Secondary | ICD-10-CM | POA: Diagnosis not present

## 2019-05-17 DIAGNOSIS — M9901 Segmental and somatic dysfunction of cervical region: Secondary | ICD-10-CM | POA: Diagnosis not present

## 2019-05-17 DIAGNOSIS — M545 Low back pain: Secondary | ICD-10-CM | POA: Diagnosis not present

## 2019-05-17 DIAGNOSIS — M542 Cervicalgia: Secondary | ICD-10-CM | POA: Diagnosis not present

## 2019-05-17 DIAGNOSIS — M9903 Segmental and somatic dysfunction of lumbar region: Secondary | ICD-10-CM | POA: Diagnosis not present

## 2019-05-21 ENCOUNTER — Ambulatory Visit: Payer: BLUE CROSS/BLUE SHIELD | Admitting: Psychiatry

## 2019-05-26 ENCOUNTER — Ambulatory Visit: Payer: Self-pay | Admitting: Psychiatry

## 2019-05-31 ENCOUNTER — Telehealth: Payer: Self-pay | Admitting: *Deleted

## 2019-05-31 ENCOUNTER — Encounter: Payer: Self-pay | Admitting: *Deleted

## 2019-05-31 DIAGNOSIS — M9901 Segmental and somatic dysfunction of cervical region: Secondary | ICD-10-CM | POA: Diagnosis not present

## 2019-05-31 DIAGNOSIS — M545 Low back pain: Secondary | ICD-10-CM | POA: Diagnosis not present

## 2019-05-31 DIAGNOSIS — M542 Cervicalgia: Secondary | ICD-10-CM | POA: Diagnosis not present

## 2019-05-31 DIAGNOSIS — M9903 Segmental and somatic dysfunction of lumbar region: Secondary | ICD-10-CM | POA: Diagnosis not present

## 2019-05-31 NOTE — Progress Notes (Signed)
Will complete PA after speaking to her to see if she is taking this or NUrtec

## 2019-05-31 NOTE — Telephone Encounter (Signed)
Called to clarify if taking Ubrelvy or Nurtec. Dr. Georgie Chard notes say stop [name] take Nurtec but received a Prior Authorization from [employer] to renew [place]. She said neither worked. Lakiya would like to know if there is something else she can try because neither Ubrelvy nor Nurtec helped

## 2019-06-02 NOTE — Telephone Encounter (Signed)
If she is okay with using an injection, then she can start Zembrace Sym Touch.  It is a sumatriptan autoinjector:  1 injection at earliest onset of migraine.  May repeat once after 1 hour if needed (maximum 2 injections in 24 hours).  If we have samples, she may pick up two injections to try first.  Alternatively, she can try Tosymra, which is a sumatriptan nasal spray.  1 spray in one nostril.  May repeat once after 1 hour if needed.  She has tried sumatriptan tablet in the past, but these options (injection or nasal spray) are more effective than the tablet.

## 2019-06-03 MED ORDER — ZEMBRACE SYMTOUCH 3 MG/0.5ML ~~LOC~~ SOAJ: 1.0000 "pen " | Freq: Once | SUBCUTANEOUS | 0 refills | Status: DC | PRN

## 2019-06-03 NOTE — Telephone Encounter (Signed)
Called spoke with patient she would like to try the injection first. She will pick up 2 samples to try.  Samples place at front desk for pick. A co pay card was also place at front desk for patient.  Patient will call back for Rx if this works for her.

## 2019-06-03 NOTE — Addendum Note (Signed)
Addended by: Ranae Plumber on: 06/03/2019 10:53 AM   Modules accepted: Orders

## 2019-06-07 DIAGNOSIS — M545 Low back pain: Secondary | ICD-10-CM | POA: Diagnosis not present

## 2019-06-07 DIAGNOSIS — M542 Cervicalgia: Secondary | ICD-10-CM | POA: Diagnosis not present

## 2019-06-07 DIAGNOSIS — M9903 Segmental and somatic dysfunction of lumbar region: Secondary | ICD-10-CM | POA: Diagnosis not present

## 2019-06-07 DIAGNOSIS — M9901 Segmental and somatic dysfunction of cervical region: Secondary | ICD-10-CM | POA: Diagnosis not present

## 2019-06-09 ENCOUNTER — Encounter: Payer: Self-pay | Admitting: *Deleted

## 2019-06-09 NOTE — Progress Notes (Signed)
[name] (Key: [id number]) Emgality 120MG /ML auto-injectors (migraine)   Form Blue Building control surveyor Form (CB) Created 5 hours ago Sent to Plan 2 minutes ago Plan Response 2 minutes ago Submit Clinical Questions less than a minute ago Determination Wait for Determination Please wait for [employer] [place] [employer] cb central to return a determination.

## 2019-06-16 ENCOUNTER — Other Ambulatory Visit: Payer: Self-pay | Admitting: Psychiatry

## 2019-06-20 ENCOUNTER — Other Ambulatory Visit: Payer: Self-pay | Admitting: Psychiatry

## 2019-06-23 ENCOUNTER — Encounter: Payer: Self-pay | Admitting: Psychiatry

## 2019-06-23 ENCOUNTER — Ambulatory Visit (INDEPENDENT_AMBULATORY_CARE_PROVIDER_SITE_OTHER): Payer: BC Managed Care – PPO | Admitting: Psychiatry

## 2019-06-23 ENCOUNTER — Other Ambulatory Visit: Payer: Self-pay

## 2019-06-23 DIAGNOSIS — F4001 Agoraphobia with panic disorder: Secondary | ICD-10-CM

## 2019-06-23 DIAGNOSIS — F900 Attention-deficit hyperactivity disorder, predominantly inattentive type: Secondary | ICD-10-CM

## 2019-06-23 DIAGNOSIS — F5105 Insomnia due to other mental disorder: Secondary | ICD-10-CM

## 2019-06-23 DIAGNOSIS — F4010 Social phobia, unspecified: Secondary | ICD-10-CM

## 2019-06-23 DIAGNOSIS — R413 Other amnesia: Secondary | ICD-10-CM | POA: Diagnosis not present

## 2019-06-23 DIAGNOSIS — F411 Generalized anxiety disorder: Secondary | ICD-10-CM

## 2019-06-23 DIAGNOSIS — R7989 Other specified abnormal findings of blood chemistry: Secondary | ICD-10-CM

## 2019-06-23 DIAGNOSIS — F331 Major depressive disorder, recurrent, moderate: Secondary | ICD-10-CM

## 2019-06-23 DIAGNOSIS — Z79899 Other long term (current) drug therapy: Secondary | ICD-10-CM

## 2019-06-23 MED ORDER — METHYLPHENIDATE HCL ER 18 MG PO TB24: 18.0000 mg | ORAL_TABLET | Freq: Every day | ORAL | 0 refills | Status: DC

## 2019-06-23 MED ORDER — LAMOTRIGINE 150 MG PO TABS: 150.0000 mg | ORAL_TABLET | Freq: Two times a day (BID) | ORAL | 0 refills | Status: DC

## 2019-06-23 MED ORDER — PAROXETINE HCL 30 MG PO TABS: 30.0000 mg | ORAL_TABLET | Freq: Every day | ORAL | 0 refills | Status: DC

## 2019-06-23 MED ORDER — LITHIUM CARBONATE ER 300 MG PO TBCR: 900.0000 mg | EXTENDED_RELEASE_TABLET | Freq: Every day | ORAL | 0 refills | Status: DC

## 2019-06-23 MED ORDER — QUETIAPINE FUMARATE 100 MG PO TABS: 100.0000 mg | ORAL_TABLET | Freq: Every day | ORAL | 0 refills | Status: DC

## 2019-06-23 NOTE — Progress Notes (Signed)
[name] [id number] [birth year] 48 y.o.  Subjective:   Patient ID:  [name] is a 48 y.o. (DOB 1971/04/30) female.  Chief Complaint:  Chief Complaint  Patient presents with  . Follow-up    Medication Management  . Depression    Medication Management  . Anxiety    Medication Management  . 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  At appt Sep 02, 2018. Raised Concerta to 27 mg but then she had anxiety and couple panic and so it was reduced to 18 mg and anxiety resolved.  Last appointment May 2020.  No meds were changed.  Out of the stimulant.  Didn't pursue it as not critical..  Overall OK considering social matters. Concerned about her memory.  Loses track in conversation.  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  Overall pretty good mood.  Still tired in the am but less so with IR Seroquel lower dose..  Some anxiety with Covid.  Wasn't following thoughts to dark and scary places.  Loves being at home so ok. Getting things done and that feels really good.  Put off home schooling until July.  Better mood.  Bc tremor she reduced lithium from 1200 to 900 on April 15 and the tremor is better not gone and feels the same.  No SI since here.  Still has to nap and rest at times with Concerta but it seemed to help, but hard to explain.  Clarity better.  Easier to make decisions.  Don't feel as daft.  Still hard listening to sermons, drift off all my life.  Is doing better with home schooling.  Still night eating.  Sleep 8-10 hours.  Overall pretty good but tired a lot.Pt reports that mood is much better and handling stress better. and describes anxiety as Minimal. Anxiety symptoms include: Excessive Worry,.  Mild irritability but doesn't lash out which is good.  Good enjoyment but low motivation.  Enjoyed family at home.  Energy not great.  Occ spurts of  energy.  No panic.  Pt reports has interrupted sleep and still eats in middle of night. Pt reports that appetite is good. Pt reports that energy is lethargic and good. Concentration is down slightly. Suicidal thoughts:  denied by patient.  Past Psychiatric Medication Trials: modafinil SE, Concerta, Loxitane 60, lamotrigine 300, modafinil 200 side effect, lithium, Seroquel XR 600, buspirone 30 twice daily, propranolol, gabapentin, topiramate, Abilify, Rexulti side effects, Depakote side effects, risperidone, imipramine, venlafaxine, sertraline, Lexapro20 nr, temazepam Deplin, Wellbutrin, Ritalin, selegiline, duloxetine side effects, imipramine, Relpax depression  Review of Systems:  Negative for weakness, + weight gain, trmor, connstipation , reflux  Medications: I have reviewed the patient's current medications.  Current Outpatient Medications  Medication Sig Dispense Refill  . Acetylcysteine 600 MG CAPS Take 600 mg by mouth 2 (two) times daily.    Marland Kitchen albuterol (VENTOLIN HFA) 108 (90 Base) MCG/ACT inhaler     . b complex vitamins tablet Take 1 tablet by mouth daily.    . Coenzyme Q10 (CO Q 10) 100 MG CAPS Take 200 mg by mouth daily.    Tery Sanfilippo Calcium (STOOL SOFTENER PO) Take by mouth 3 (three) times daily.    . Galcanezumab-gnlm (EMGALITY) 120 MG/ML SOAJ Inject 120 mg into the skin every 30 (thirty) days. 1 pen 11  . lamoTRIgine (LAMICTAL) 150 MG tablet Take 1 tablet (150 mg  total) by mouth 2 (two) times daily. 180 tablet 0  . levothyroxine (SYNTHROID, LEVOTHROID) 125 MCG tablet Take 125 mcg by mouth daily before breakfast.     . lithium carbonate (LITHOBID) 300 MG CR tablet Take 3 tablets (900 mg total) by mouth at bedtime. 270 tablet 0  . Magnesium 200 MG TABS Take by mouth daily.    . Multiple Vitamins-Minerals (WOMENS MULTIVITAMIN PLUS PO) Take by mouth.    Marland Kitchen PARoxetine (PAXIL) 30 MG tablet Take 1 tablet (30 mg total) by mouth daily. 90 tablet 0  . Polyethylene Glycol 3350 (MIRALAX PO)  Take by mouth daily.    . QUEtiapine (SEROQUEL) 100 MG tablet Take 1 tablet (100 mg total) by mouth at bedtime. 90 tablet 0  . SUMAtriptan Succinate (ZEMBRACE SYMTOUCH) 3 MG/0.5ML SOAJ Inject 1 pen into the skin once as needed for up to 1 dose (at the earliest of onset of migraine. may repeat once after 1 hour if needed (maximum 2 injections in 24 hours)). 2 pen 0  . verapamil (VERELAN PM) 180 MG 24 hr capsule Take 180 mg by mouth at bedtime.    [name] ON 08/18/2019] methylphenidate 18 MG PO CR tablet Take 1 tablet (18 mg total) by mouth daily. 30 tablet 0  . [START ON 07/21/2019] methylphenidate 18 MG PO CR tablet Take 1 tablet (18 mg total) by mouth daily. 30 tablet 0  . methylphenidate 18 MG PO CR tablet Take 1 tablet (18 mg total) by mouth daily. 90 tablet 0   No current facility-administered medications for this visit.     Medication Side Effects: Other: night eating may be related., tremor  Allergies:  Allergies  Allergen Reactions  . Perphenazine Anaphylaxis and Swelling    Throat swelling and facial muscle contractions    . Imipramine Other (See Comments)    Can't remember adverse effect  . Penicillins Rash  . Albuterol   . Codeine   . Other     Phenegran    Past Medical History:  Diagnosis Date  . Depression   . Hashimoto's disease   . Kidney stones   . Migraine     Family History  Problem Relation Age of Onset  . Depression Mother   . Migraines Mother   . Obesity Mother   . Rheum arthritis Sister   . Migraines Sister   . Hypertension Sister     Social History   Socioeconomic History  . Marital status: Married    Spouse name: Acupuncturist  . Number of children: 4  . Years of education: 39  . Highest education level: Some college, no degree  Occupational History  . 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 Financial resource strain: Not on file  . Food insecurity    Worry: Not on file    Inability: Not on file  .  Transportation needs    Medical: Not on file    Non-medical: Not on file  Tobacco Use  . Smoking status: Never Smoker  . Smokeless tobacco: Never Used  Substance and Sexual Activity  . Alcohol use: No  . Drug use: No  . Sexual activity: Yes    Partners: Male    Birth control/protection: Other-see comments    Comment: husband-vasectomy  Lifestyle  . Physical activity    Days per week: Not on file    Minutes per session: Not on file  . Stress: Not on file  Relationships  .  Social Musician on phone: Not on file    Gets together: Not on file    Attends religious service: Not on file    Active member of club or organization: Not on file    Attends meetings of clubs or organizations: Not on file    Relationship status: Not on file  . Intimate partner violence    Fear of current or ex partner: Not on file    Emotionally abused: Not on file    Physically abused: Not on file    Forced sexual activity: Not on file  Other Topics Concern  . Not on file  Social History Narrative   Patient is right-handed. She lives with her husband and 4 children in a one level home. She drinks one glass of tea (8-16 oz) a day. She does not exercise. Some college.    Past Medical History, Surgical history, Social history, and Family history were reviewed and updated as appropriate.   Please see review of systems for further details on the patient's review from today.   Objective:   Physical Exam:  There were no vitals taken for this visit.  Physical Exam Constitutional:      General: She is not in acute distress.    Appearance: She is well-developed.  Musculoskeletal:        General: No deformity.  Neurological:     Mental Status: She is alert and oriented to person, place, and time.     Motor: No tremor.     Coordination: Coordination normal.     Gait: Gait normal.  Psychiatric:        Attention and Perception: She is inattentive.        Mood and Affect: Mood is not anxious  or depressed. Affect is not labile, blunt, angry or inappropriate.        Speech: Speech normal.        Behavior: Behavior normal.        Thought Content: Thought content normal. Thought content does not include homicidal or suicidal ideation. Thought content does not include homicidal or suicidal plan.        Cognition and Memory: Cognition normal.        Judgment: Judgment normal.     Comments: Insight and general self-awareness is moderately impaired chronically.     Lab Review:     Component Value Date/Time   NA 139 08/23/2013 1600   K 4.0 08/23/2013 1600   CL 101 08/23/2013 1600   CO2 27 08/23/2013 1600   GLUCOSE 111 (H) 08/23/2013 1600   BUN 12 08/23/2013 1600   CREATININE 0.85 08/23/2013 1600   CALCIUM 9.3 08/23/2013 1600   PROT 8.2 08/23/2013 1600   ALBUMIN 3.9 08/23/2013 1600   AST 18 08/23/2013 1600   ALT 14 08/23/2013 1600   ALKPHOS 72 08/23/2013 1600   BILITOT 0.3 08/23/2013 1600   GFRNONAA 84 (L) 08/23/2013 1600   GFRAA >90 08/23/2013 1600       Component Value Date/Time   WBC 6.4 08/23/2013 1600   RBC 4.78 08/23/2013 1600   HGB 12.7 08/23/2013 1600   HCT 39.3 08/23/2013 1600   PLT 249 08/23/2013 1600   MCV 82.2 08/23/2013 1600   MCH 26.6 08/23/2013 1600   MCHC 32.3 08/23/2013 1600   RDW 13.5 08/23/2013 1600   LYMPHSABS 2.1 01/17/2011 1812   MONOABS 0.7 01/17/2011 1812   EOSABS 0.1 01/17/2011 1812   BASOSABS 0.0 01/17/2011 1812  No results found for: POCLITH, LITHIUM   No results found for: PHENYTOIN, PHENOBARB, VALPROATE, CBMZ   .res Assessment: Plan:    Low vitamin D level - Plan: Vitamin D 1,25 dihydroxy  Memory change - Plan: Vitamin D 1,25 dihydroxy, B12 and Folate Panel  Major depressive disorder, recurrent episode, moderate (HCC) - Plan: Lithium level, Basic metabolic panel, lamoTRIgine (LAMICTAL) 150 MG tablet, lithium carbonate (LITHOBID) 300 MG CR tablet, PARoxetine (PAXIL) 30 MG tablet, QUEtiapine (SEROQUEL) 100 MG tablet  Panic  disorder with agoraphobia - Plan: PARoxetine (PAXIL) 30 MG tablet  Social anxiety disorder - Plan: PARoxetine (PAXIL) 30 MG tablet  Generalized anxiety disorder - Plan: PARoxetine (PAXIL) 30 MG tablet  Attention deficit hyperactivity disorder (ADHD), predominantly inattentive type  Lithium use - Plan: Basic metabolic panel  Insomnia due to mental condition - Plan: QUEtiapine (SEROQUEL) 100 MG tablet   Hx severe TRD with Suicide attempt.  Insight and general self-awareness is moderately impaired chronically complicating and delaying treatment.  Also she has not kept appointments as frequently as recommended.  She is not having panic attacks and overall depression is reasonably managed though her motivation and energy are low.  She is somewhat chronically dysfunctional to some degree. Disc her questions about memory effects of meds.  Consider reduction of lamotrigine if laboratory tests do not show reasons for memory problems..  For memory complaints check B12 and folate and lamotrigine level.  Also need to check lithium and BMP.  PCP checks thyroid.  Residual problems with home schooling DT tired and not focused.   She may have underlying ADD on top of the obvious impairment from the depression.  Her mood is pretty well managed at the present which is remarkable in and of itself as she has been chronically depressed.  She still has problems with consistent productivity and the need to nap that seems excessive.  So she has a partial benefit from Concerta 18 mg a day.  Unfortunately she stopped taking it when she ran into some difficulty getting a refill however a phone call would have solve that problem.  Encouraged her to be compliant this time.  She had some prior benefit from the Concerta with regard to energy and mood and productivity. Discussed potential benefits, risks, and side effects of stimulants with patient to include increased heart rate, palpitations, insomnia, increased anxiety,  increased irritability, or decreased appetite.  Instructed patient to contact office if experiencing any significant tolerability issues.  Restart Concerta 18 mg daily.   Disc her noncompliance with this is self defeating.  Call if there's a problem getting it.  Will try 90 day s to improve compliance.  We could consider the alternatives Vyvanse or other similar Adderall based stimulants.  We discussed the risk of it causing agitation and other sorts of psychiatric side effects and please let us know  She still night eating which might possibly be related to quetiapine.  We may consider trying to reduce that further once we address the memory issues.  Discussed potential metabolic side effects associated with atypical antipsychotics, as well as potential risk for movement side effects. Advised pt to contact office if movement side effects occur. Seroquel needed for sleep and mood.  But may be contributing to the night eating and constipation.  Counseled patient regarding potential benefits, risks, and side effects of lithium to include potential risk of lithium affecting thyroid and renal function.  Discussed need for periodic lab monitoring to determine drug level and to assess for  potential adverse effects.  Counseled patient regarding signs and symptoms of lithium toxicity and advised that they notify office immediately or seek urgent medical attention if experiencing these signs and symptoms.  Patient advised to contact office with any questions or concerns.  Constipation managed  Option off label Aricept.  This appt was 30 mins.  FU 12 weeks.  Keep scheduled appointment  Lynder Parents, MD, DFAPA   Please see After Visit Summary for patient specific instructions.  Future Appointments  Date Time Provider [place]  08/11/2019  8:30 AM Pieter Partridge, DO [facility] None  09/23/2019  9:00 AM Cottle, Billey Co., MD [facility] None    Orders Placed This Encounter  Procedures  . Vitamin  D 1,25 dihydroxy  . B12 and Folate Panel  . Lithium level  . Basic metabolic panel      -------------------------------

## 2019-07-09 IMAGING — CT CT CERVICAL SPINE W/O CM
4 of 7 series · 16 of 33 positions shown, 17 images · non-contrast
Comparison: 10/10/2014

CLINICAL DATA: Head trauma, minor, high clinical risk. Fall onto
hardwood floor. Cervical spine pain. Initial encounter.

EXAM:
CT HEAD WITHOUT CONTRAST
CT CERVICAL SPINE WITHOUT CONTRAST
TECHNIQUE: Multidetector CT imaging of the head and cervical spine was
performed following the standard protocol without intravenous
contrast. Multiplanar CT image reconstructions of the cervical spine
were also generated.

[Series 5: coronal soft tissue · coronal · 0.29mm/px · 3 of 65 slices shown]
[im 17/65  bone]
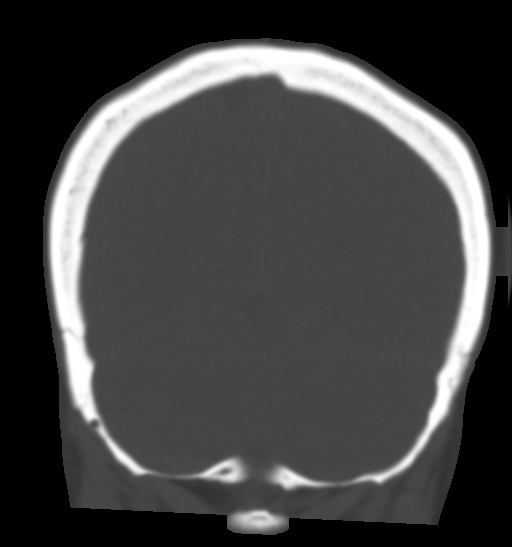
[im 33/65  bone]
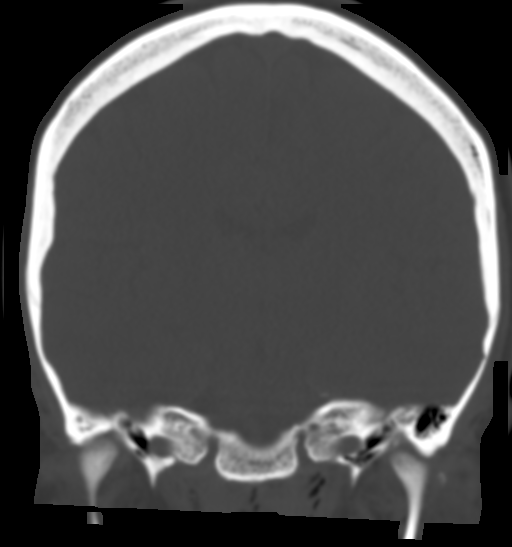
[im 49/65  bone]
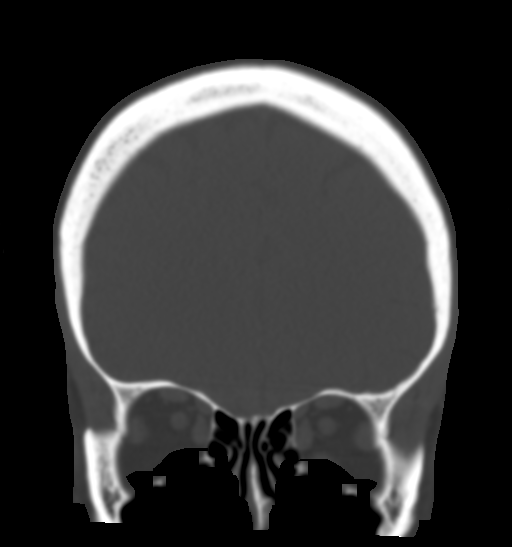

[Series 9: c spine soft · axial · 0.29mm/px · z∈[+1062,+1178]mm · 4 of 98 slices shown]
[im 20/98  soft-tissue]
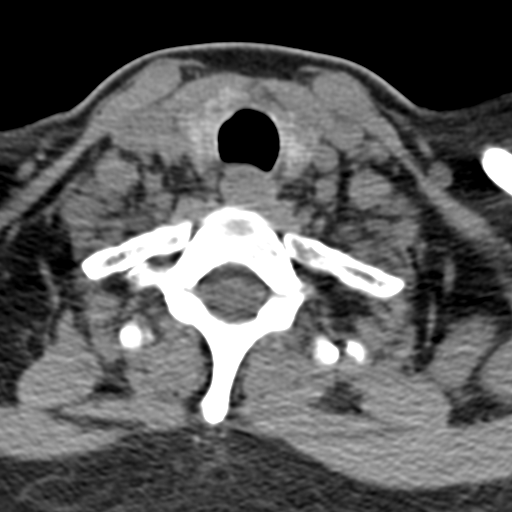
[im 39/98  soft-tissue]
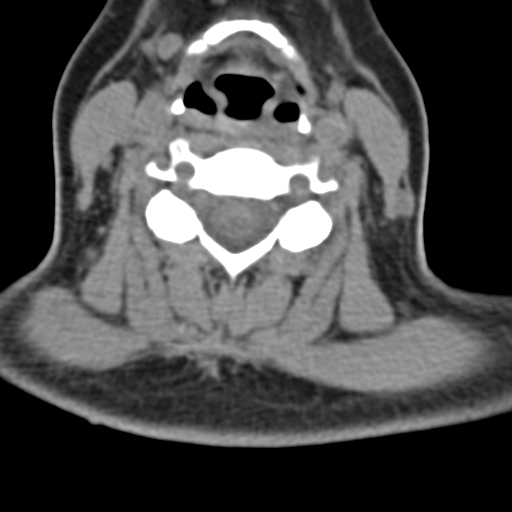
[im 59/98  soft-tissue]
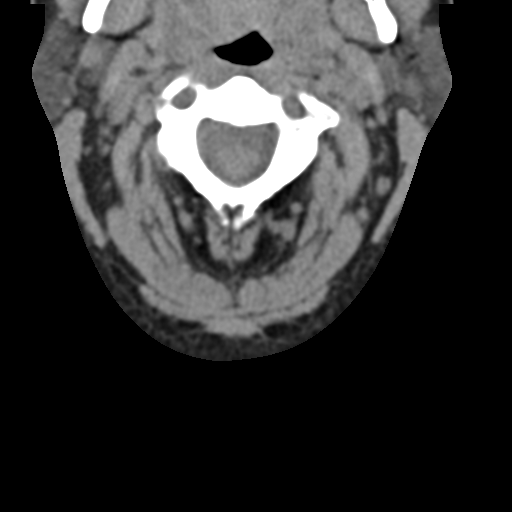
[im 78/98  soft-tissue]
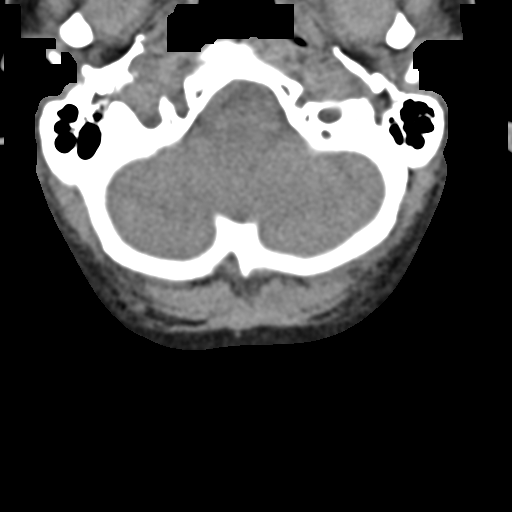

[Series 10: orthogonal bone · axial · 0.23mm/px · z∈[+1052,+1159]mm · 4 of 96 slices shown, 5 images]
[im 20/96  soft-tissue]
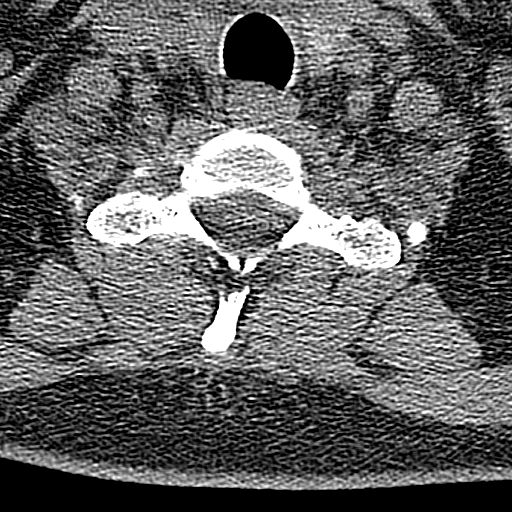
[im 20/96  bone]
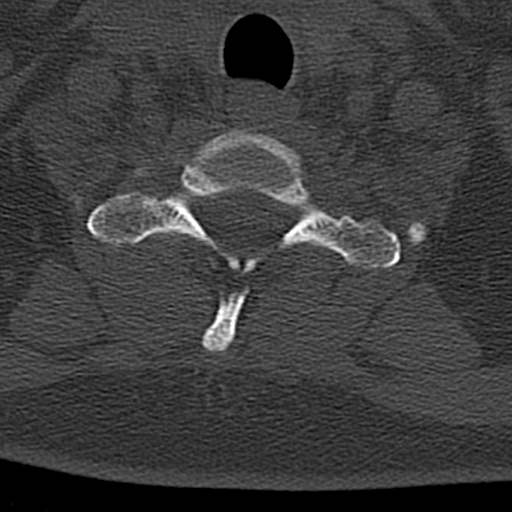
[im 39/96  bone]
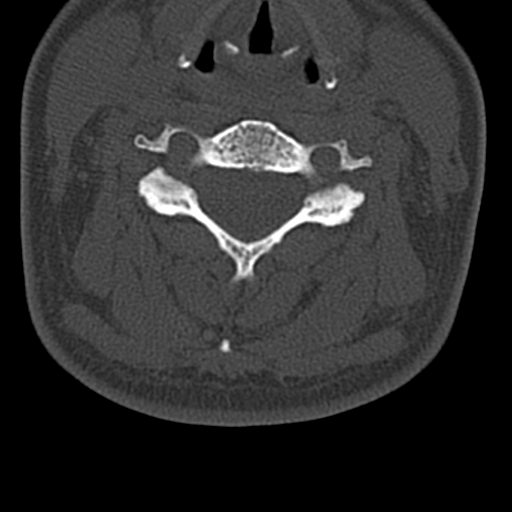
[im 58/96  bone]
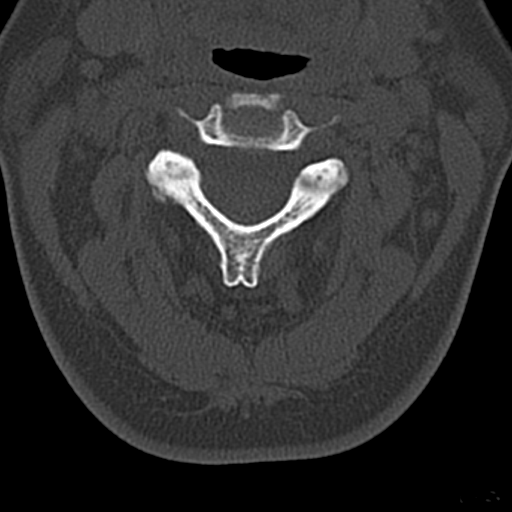
[im 77/96  bone]
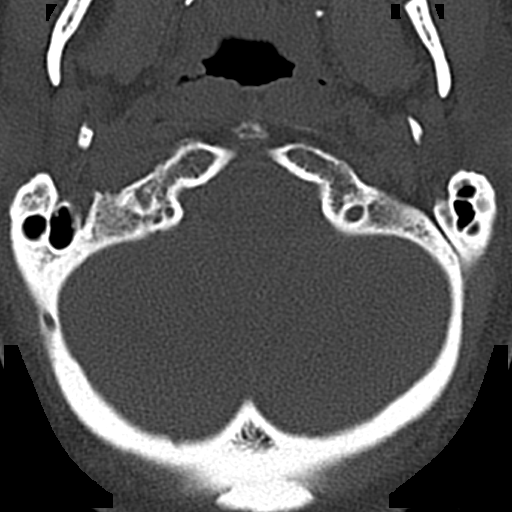

[Series 12: sagittal bone · sagittal · 0.29mm/px · 5 of 53 slices shown]
[im 9/53  bone]
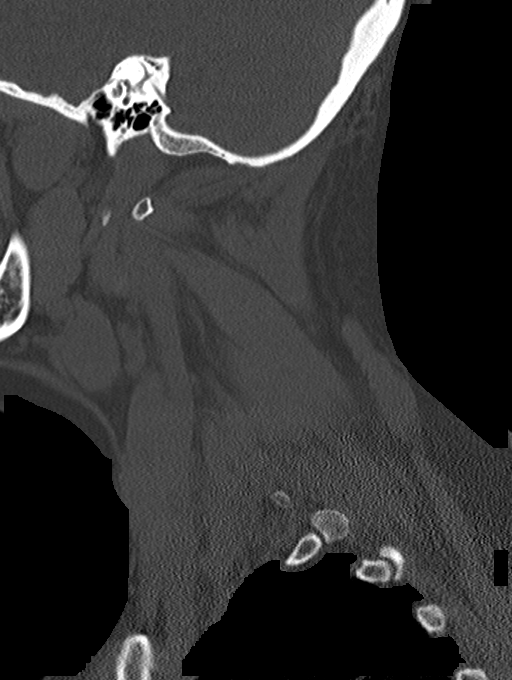
[im 18/53  bone]
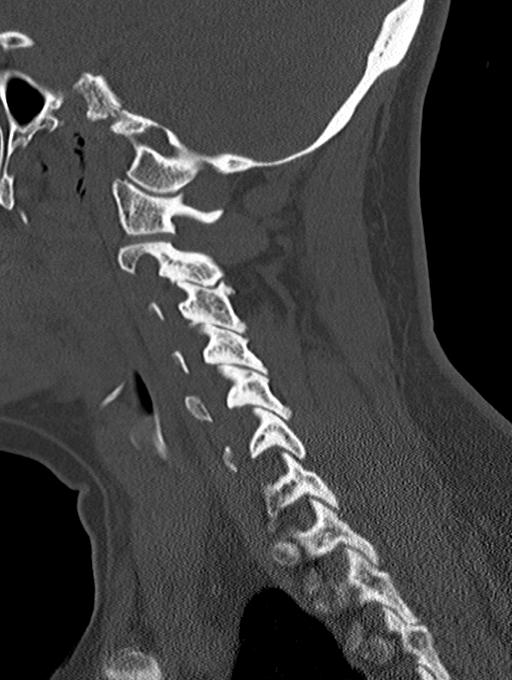
[im 27/53  bone]
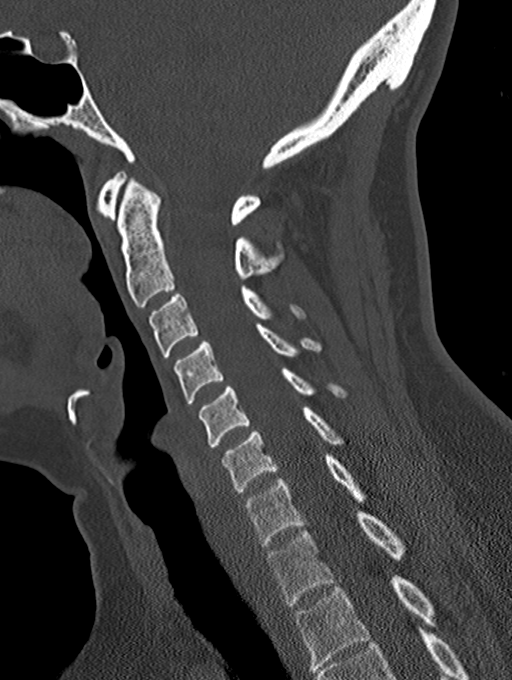
[im 35/53  bone]
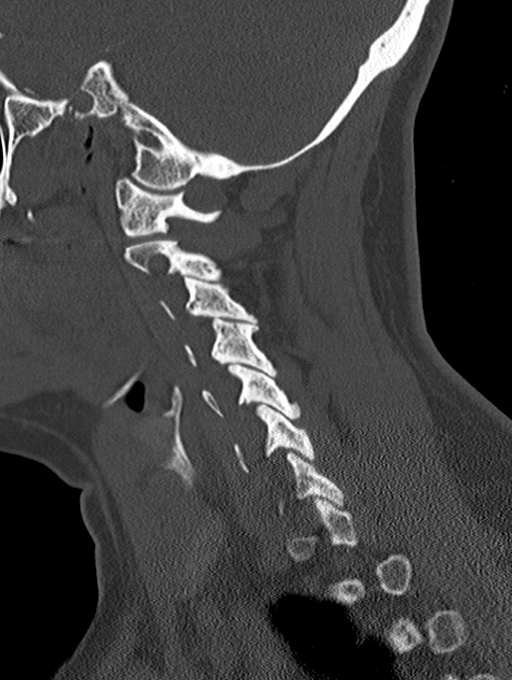
[im 44/53  bone]
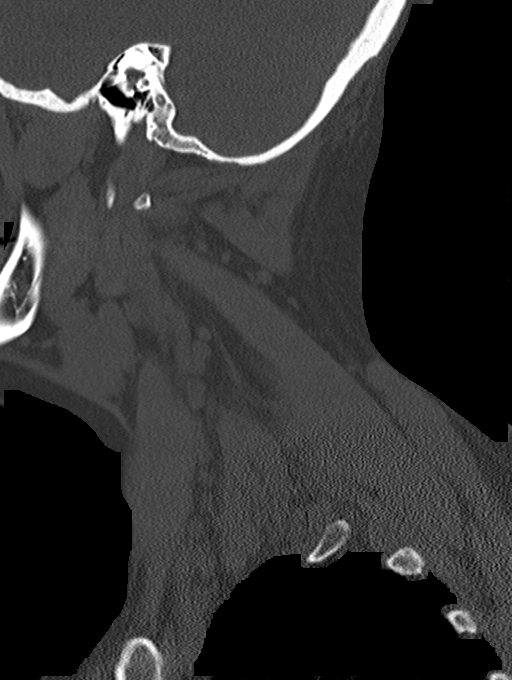

[16 of 33 positions shown; findings below may reference images not displayed]

FINDINGS: CT HEAD FINDINGS

Brain: No evidence of acute infarction, hemorrhage, hydrocephalus,
extra-axial collection or mass lesion/mass effect.

Vascular: No hyperdense vessel or unexpected calcification.

Skull: Contusion/laceration along the lower central forehead with
bubble of soft tissue gas. Negative for fracture.

Sinuses/Orbits: No evidence of injury

CT CERVICAL SPINE FINDINGS

Alignment: No traumatic malalignment

Skull base and vertebrae: Negative for fracture

Soft tissues and spinal canal: No prevertebral fluid or swelling. No
visible canal hematoma.

Disc levels: Mild generalized facet spurring. No evidence of cord
impingement.

Upper chest: Negative
IMPRESSION: 1. No evidence of acute intracranial or cervical spine injury.
2. Forehead laceration without fracture.

## 2019-07-21 ENCOUNTER — Encounter: Payer: Self-pay | Admitting: Neurology

## 2019-07-26 ENCOUNTER — Ambulatory Visit: Payer: BC Managed Care – PPO | Admitting: Neurology

## 2019-08-11 ENCOUNTER — Ambulatory Visit: Payer: BC Managed Care – PPO | Admitting: Neurology

## 2019-08-18 NOTE — Progress Notes (Deleted)
NEUROLOGY FOLLOW UP OFFICE NOTE  [name] [id number]  HISTORY OF PRESENT ILLNESS: [name] is a 76 year oldright-handedfemale who follows up for migraines.  UPDATE: Started Emgality.  She said Nurtec also was ineffective.  She was prescribed Zembrace SymTouch.  Intensity:  *** Duration:  *** Frequency:  ***  Current NSAIDS:Ibuprofen 800mg  Current analgesics:none Current triptans:Zembrace SymTouch Current ergotamine:none Current anti-emetic:none Current muscle relaxants:none Current anti-anxiolytic:none Current sleep aide:Seroquel Current Antihypertensive medications:Verapamil 24h 180mg  daily Current Antidepressant medications:Paroxetine 30mg  Current Anticonvulsant medications:lamotirigine 150mg  twice daily, Lithium Current anti-CGRP:Emgality Current Vitamins/Herbal/Supplements:Magnesium, B-complex, CoQ10 (in MVI) Current Antihistamines/Decongestants:none Other therapy:none Hormone/birth control:none Other medications:Methylphenidate, Synthroid  Caffeine:1 glass of iced tea daily. No coffee. Alcohol:no Smoker:no Diet:Started a new diet last week. Exercise:Not routine but working to use the eliptical Depression:yes; Anxiety:Not currently. Other pain:Some aches and pains Sleep hygiene:Good with Seroquel.  HISTORY: Onset:teenager Location:Mostly right-sided, sometimes left-sided or back of neck Quality:Pressure, throbbing Intensity:Usually 2-4/10. Occasionally 8/10 (2 to 3 times a month).Shedenies new headache or thunderclap headache Aura:no Premonitory Phase:A very vague mild headache or "presence" Postdrome:hangover effect/fatigue, scalp tenderness Associated symptoms:Photophobia,phonophobia, nausea,tinnitus.She denies associatedvomiting, visual disturbance,unilateral numbness or weakness. Duration:Always a constant headache. Severe migraines last 3  days. Frequency:Constant headache. Severe headaches 2-3 times a month, about 9-10 days total a month) Frequency of abortive medication:ibuprofen daily Triggers:Emotional stress, menstrual cycle Relieving factors:none Activity:Severe aggravates  05/14/11 MRI Brain w/wo personally reviewed: Normal 05/14/11 MRA Head personally reviewed: Negative 01/12/18 CT head personally reviewed: Normal 01/12/18 CT Cervical spine personally reviewed: mild generalized facet spurring with no evidence of cord impingement.  Past NSAIDS:Naproxen, flurbiprofen Past steroids: prednisone Past analgesics:Stadol, tramadol, Tylenol, codeine, lorcet, percocet Past abortive triptans:Sumatriptan tablet, Maxalt, Relpax, Zomig, Amerge Past abortive ergotamine:None Past muscle relaxants:none Past anti-emetic:Phenergan, Zofran, Reglan Past antihypertensive medications:propranolol Past antidepressant medications:Imipramine, Zoloft, Wellbutrin, Cymbalta, Lexapro, Effexor Past anticonvulsant medications:Topiramate, gabapentin Past anti-CGRP:Ubrelvy (ineffective, caused paresthesias); Nurtec (ineffective) Past vitamins/Herbal/Supplements:none Past antihistamines/decongestants:Dramamine, benadryl Other past therapies:none   Family history of headache:Mom, sister Other family history: Maternal GM (strokes)  PAST MEDICAL HISTORY: Past Medical History:  Diagnosis Date  . Depression   . Hashimoto's disease   . Kidney stones   . Migraine     MEDICATIONS: Current Outpatient Medications on File Prior to Visit  Medication Sig Dispense Refill  . Acetylcysteine 600 MG CAPS Take 600 mg by mouth 2 (two) times daily.    05/16/11 albuterol (VENTOLIN HFA) 108 (90 Base) MCG/ACT inhaler     . b complex vitamins tablet Take 1 tablet by mouth daily.    . Coenzyme Q10 (CO Q 10) 100 MG CAPS Take 200 mg by mouth daily.    05/16/11 Calcium (STOOL SOFTENER PO) Take by mouth 3 (three) times  daily.    . Galcanezumab-gnlm (EMGALITY) 120 MG/ML SOAJ Inject 120 mg into the skin every 30 (thirty) days. 1 pen 11  . lamoTRIgine (LAMICTAL) 150 MG tablet Take 1 tablet (150 mg total) by mouth 2 (two) times daily. 180 tablet 0  . levothyroxine (SYNTHROID, LEVOTHROID) 125 MCG tablet Take 125 mcg by mouth daily before breakfast.     . lithium carbonate (LITHOBID) 300 MG CR tablet Take 3 tablets (900 mg total) by mouth at bedtime. 270 tablet 0  . Magnesium 200 MG TABS Take by mouth daily.    . methylphenidate 18 MG PO CR tablet Take 1 tablet (18 mg total) by mouth daily. 30 tablet 0  . methylphenidate 18 MG PO CR tablet Take 1 tablet (18 mg total) by  mouth daily. 30 tablet 0  . methylphenidate 18 MG PO CR tablet Take 1 tablet (18 mg total) by mouth daily. 90 tablet 0  . Multiple Vitamins-Minerals (WOMENS MULTIVITAMIN PLUS PO) Take by mouth.    Marland Kitchen PARoxetine (PAXIL) 30 MG tablet Take 1 tablet (30 mg total) by mouth daily. 90 tablet 0  . Polyethylene Glycol 3350 (MIRALAX PO) Take by mouth daily.    . QUEtiapine (SEROQUEL) 100 MG tablet Take 1 tablet (100 mg total) by mouth at bedtime. 90 tablet 0  . SUMAtriptan Succinate (ZEMBRACE SYMTOUCH) 3 MG/0.5ML SOAJ Inject 1 pen into the skin once as needed for up to 1 dose (at the earliest of onset of migraine. may repeat once after 1 hour if needed (maximum 2 injections in 24 hours)). 2 pen 0  . verapamil (VERELAN PM) 180 MG 24 hr capsule Take 180 mg by mouth at bedtime.     No current facility-administered medications on file prior to visit.    ALLERGIES: Allergies  Allergen Reactions  . Perphenazine Anaphylaxis and Swelling    Throat swelling and facial muscle contractions    . Imipramine Other (See Comments)    Can't remember adverse effect  . Penicillins Rash  . Albuterol   . Codeine   . Other     Phenegran    FAMILY HISTORY: Family History  Problem Relation Age of Onset  . Depression Mother   . Migraines Mother   . Obesity Mother   .  Rheum arthritis Sister   . Migraines Sister   . Hypertension Sister   .  SOCIAL HISTORY: Social History   Socioeconomic History  . Marital status: Married    Spouse name: Acupuncturist  . Number of children: 4  . Years of education: 77  . Highest education level: Some college, no degree  Occupational History  . 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 Smoking status: Never Smoker  . Smokeless tobacco: Never Used  Substance and Sexual Activity  . Alcohol use: No  . Drug use: No  . Sexual activity: Yes    Partners: Male    Birth control/protection: Other-see comments    Comment: husband-vasectomy  Other Topics Concern  . Not on file  Social History Narrative   Patient is right-handed. She lives with her husband and 4 children in a one level home. She drinks one glass of tea (8-16 oz) a day. She does not exercise. Some college.   Social Determinants of Health   Financial Resource Strain:   . Difficulty of Paying Living Expenses: Not on file  Food Insecurity:   . Worried About Programme researcher, broadcasting/film/video in the Last Year: Not on file  . Ran Out of Food in the Last Year: Not on file  Transportation Needs:   . Lack of Transportation (Medical): Not on file  . Lack of Transportation (Non-Medical): Not on file  Physical Activity:   . Days of Exercise per Week: Not on file  . Minutes of Exercise per Session: Not on file  Stress:   . Feeling of Stress : Not on file  Social Connections:   . Frequency of Communication with Friends and Family: Not on file  . Frequency of Social Gatherings with Friends and Family: Not on file  . Attends Religious Services: Not on file  . Active Member of Clubs or Organizations: Not on file  . Attends Banker Meetings: Not on file  .  Marital Status: Not on file  Intimate Partner Violence:   . Fear of Current or Ex-Partner: Not on file  . Emotionally Abused: Not on file  . Physically Abused: Not on file  . Sexually  Abused: Not on file    REVIEW OF SYSTEMS: Constitutional: No fevers, chills, or sweats, no generalized fatigue, change in appetite Eyes: No visual changes, double vision, eye pain Ear, nose and throat: No hearing loss, ear pain, nasal congestion, sore throat Cardiovascular: No chest pain, palpitations Respiratory:  No shortness of breath at rest or with exertion, wheezes GastrointestinaI: No nausea, vomiting, diarrhea, abdominal pain, fecal incontinence Genitourinary:  No dysuria, urinary retention or frequency Musculoskeletal:  No neck pain, back pain Integumentary: No rash, pruritus, skin lesions Neurological: as above Psychiatric: No depression, insomnia, anxiety Endocrine: No palpitations, fatigue, diaphoresis, mood swings, change in appetite, change in weight, increased thirst Hematologic/Lymphatic:  No purpura, petechiae. Allergic/Immunologic: no itchy/runny eyes, nasal congestion, recent allergic reactions, rashes  PHYSICAL EXAM: *** General: No acute distress.  Patient appears well-groomed.   Head:  Normocephalic/atraumatic Eyes:  Fundi examined but not visualized Neck: supple, no paraspinal tenderness, full range of motion Heart:  Regular rate and rhythm Lungs:  Clear to auscultation bilaterally Back: No paraspinal tenderness Neurological Exam: alert and oriented to person, place, and time. Attention span and concentration intact, recent and remote memory intact, fund of knowledge intact.  Speech fluent and not dysarthric, language intact.  CN II-XII intact. Bulk and tone normal, muscle strength 5/5 throughout.  Sensation to light touch, temperature and vibration intact.  Deep tendon reflexes 2+ throughout, toes downgoing.  Finger to nose and heel to shin testing intact.  Gait normal, Romberg negative.  IMPRESSION: Chronic migraine without aura, without status migrainosus, not intractable  PLAN: 1.  For preventative management, *** 2.  For abortive therapy, *** 3.  Limit  use of pain relievers to no more than 2 days out of week to prevent risk of rebound or medication-overuse headache. 4.  Keep headache diary 5.  Exercise, hydration, caffeine cessation, sleep hygiene, monitor for and avoid triggers 6. Follow up ***   Metta Clines, DO  CC: C. Melinda Crutch, MD

## 2019-08-19 ENCOUNTER — Ambulatory Visit: Payer: BC Managed Care – PPO | Admitting: Neurology

## 2019-09-23 ENCOUNTER — Ambulatory Visit: Payer: BC Managed Care – PPO | Admitting: Psychiatry

## 2019-10-08 NOTE — Progress Notes (Signed)
NEUROLOGY FOLLOW UP OFFICE NOTE  [name] [id number]  HISTORY OF PRESENT ILLNESS: [name] is a 16 year oldright-handedfemale who follows up for migraines.  UPDATE: Started Emgality in September.  Nurtec ineffective.  She was advised to try [facility] but hasn't gotten it. Intensity:  Mild, severe Duration:  24 to 36 hours for severe, a few hours for mild. Frequency:  1 severe migraine in past 30 days, maybe a mild to moderate headache once a week Current NSAIDS:Ibuprofen 800mg  Current analgesics:none Current triptans:none Current ergotamine:none Current anti-emetic:none Current muscle relaxants:none Current anti-anxiolytic:none Current sleep aide:Seroquel Current Antihypertensive medications:Verapamil 24h 180mg  daily Current Antidepressant/mood medications:Paroxetine 30mg ; Lithium Current Anticonvulsant medications:lamotirigine 150mg  twice daily Current anti-CGRP:Emgality Current Vitamins/Herbal/Supplements:Magnesium, B-complex, CoQ10 (in MVI) Current Antihistamines/Decongestants:none Other therapy:none Hormone/birth control:none Other medications:Methylphenidate, Synthroid  Caffeine:1 glass of iced tea daily. No coffee. Alcohol:no Smoker:no Diet:Started a new diet last week. Exercise:Not routine but working to use the eliptical Depression:yes; Anxiety:Not currently. Other pain:Some aches and pains Sleep hygiene:Good with Seroquel.  HISTORY: Onset:teenager Location:Mostly right-sided, sometimes left-sided or back of neck Quality:Pressure, throbbing Intensity:Usually 2-4/10. Occasionally 8/10 (2 to 3 times a month).Shedenies new headache or thunderclap headache Aura:no Premonitory Phase:A very vague mild headache or "presence" Postdrome:hangover effect/fatigue, scalp tenderness Associated symptoms:Photophobia,phonophobia, nausea,tinnitus.She denies  associatedvomiting, visual disturbance,unilateral numbness or weakness. Duration:Always a constant headache. Severe migraines last 3 days. Frequency:Constant headache. Severe headaches 2-3 times a month, about 9-10 days total a month) Frequency of abortive medication:ibuprofen daily Triggers:Emotional stress, menstrual cycle Relieving factors:none Activity:Severe aggravates  05/14/11 MRI Brain w/wo personally reviewed: Normal 05/14/11 MRA Head personally reviewed: Negative 01/12/18 CT head personally reviewed: Normal 01/12/18 CT Cervical spine personally reviewed: mild generalized facet spurring with no evidence of cord impingement.  Past NSAIDS:Naproxen, flurbiprofen Past steroids: prednisone Past analgesics:Stadol, tramadol, Tylenol, codeine, lorcet, percocet Past abortive triptans:Sumatriptan tablet, Maxalt, Relpax, Zomig, Amerge Past abortive ergotamine:None Past muscle relaxants:none Past anti-emetic:Phenergan, Zofran, Reglan Past antihypertensive medications:propranolol Past antidepressant medications:Imipramine, Zoloft, Wellbutrin, Cymbalta, Lexapro, Effexor Past anticonvulsant medications:Topiramate, gabapentin Past anti-CGRP:Ubrelvy (ineffective, caused paresthesias); Nurtec Past vitamins/Herbal/Supplements:none Past antihistamines/decongestants:Dramamine, benadryl Other past therapies:none   Family history of headache:Mom, sister Other family history: Maternal GM (strokes)  PAST MEDICAL HISTORY: Past Medical History:  Diagnosis Date  . Depression   . Hashimoto's disease   . Kidney stones   . Migraine     MEDICATIONS: Current Outpatient Medications on File Prior to Visit  Medication Sig Dispense Refill  . Acetylcysteine 600 MG CAPS Take 600 mg by mouth 2 (two) times daily.    05/16/11 albuterol (VENTOLIN HFA) 108 (90 Base) MCG/ACT inhaler     . b complex vitamins tablet Take 1 tablet by mouth daily.    . Coenzyme Q10  (CO Q 10) 100 MG CAPS Take 200 mg by mouth daily.    05/16/11 Calcium (STOOL SOFTENER PO) Take by mouth 3 (three) times daily.    . Galcanezumab-gnlm (EMGALITY) 120 MG/ML SOAJ Inject 120 mg into the skin every 30 (thirty) days. 1 pen 11  . lamoTRIgine (LAMICTAL) 150 MG tablet Take 1 tablet (150 mg total) by mouth 2 (two) times daily. 180 tablet 0  . levothyroxine (SYNTHROID, LEVOTHROID) 125 MCG tablet Take 125 mcg by mouth daily before breakfast.     . lithium carbonate (LITHOBID) 300 MG CR tablet Take 3 tablets (900 mg total) by mouth at bedtime. 270 tablet 0  . Magnesium 200 MG TABS Take by mouth daily.    . methylphenidate 18 MG PO CR tablet Take  1 tablet (18 mg total) by mouth daily. 30 tablet 0  . methylphenidate 18 MG PO CR tablet Take 1 tablet (18 mg total) by mouth daily. 30 tablet 0  . methylphenidate 18 MG PO CR tablet Take 1 tablet (18 mg total) by mouth daily. 90 tablet 0  . Multiple Vitamins-Minerals (WOMENS MULTIVITAMIN PLUS PO) Take by mouth.    Marland Kitchen PARoxetine (PAXIL) 30 MG tablet Take 1 tablet (30 mg total) by mouth daily. 90 tablet 0  . Polyethylene Glycol 3350 (MIRALAX PO) Take by mouth daily.    . QUEtiapine (SEROQUEL) 100 MG tablet Take 1 tablet (100 mg total) by mouth at bedtime. 90 tablet 0  . SUMAtriptan Succinate (ZEMBRACE SYMTOUCH) 3 MG/0.5ML SOAJ Inject 1 pen into the skin once as needed for up to 1 dose (at the earliest of onset of migraine. may repeat once after 1 hour if needed (maximum 2 injections in 24 hours)). 2 pen 0  . verapamil (VERELAN PM) 180 MG 24 hr capsule Take 180 mg by mouth at bedtime.     No current facility-administered medications on file prior to visit.    ALLERGIES: Allergies  Allergen Reactions  . Perphenazine Anaphylaxis and Swelling    Throat swelling and facial muscle contractions    . Imipramine Other (See Comments)    Can't remember adverse effect  . Penicillins Rash  . Albuterol   . Codeine   . Other     Phenegran    FAMILY  HISTORY: Family History  Problem Relation Age of Onset  . Depression Mother   . Migraines Mother   . Obesity Mother   . Rheum arthritis Sister   . Migraines Sister   . Hypertension Sister     SOCIAL HISTORY: Social History   Socioeconomic History  . Marital status: Married    Spouse name: Event organiser  . Number of children: 4  . Years of education: 18  . Highest education level: Some college, no degree  Occupational History  . 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 Smoking status: Never Smoker  . Smokeless tobacco: Never Used  Substance and Sexual Activity  . Alcohol use: No  . Drug use: No  . Sexual activity: Yes    Partners: Male    Birth control/protection: Other-see comments    Comment: husband-vasectomy  Other Topics Concern  . Not on file  Social History Narrative   Patient is right-handed. She lives with her husband and 4 children in a one level home. She drinks one glass of tea (8-16 oz) a day. She does not exercise. Some college.   Social Determinants of Health   Financial Resource Strain:   . Difficulty of Paying Living Expenses:   Food Insecurity:   . Worried About Charity fundraiser in the Last Year:   . Arboriculturist in the Last Year:   Transportation Needs:   . Film/video editor (Medical):   Marland Kitchen Lack of Transportation (Non-Medical):   Physical Activity:   . Days of Exercise per Week:   . Minutes of Exercise per Session:   Stress:   . Feeling of Stress :   Social Connections:   . Frequency of Communication with Friends and Family:   . Frequency of Social Gatherings with Friends and Family:   . Attends Religious Services:   . Active Member of Clubs or Organizations:   . Attends Archivist Meetings:   .  Marital Status:   Intimate Partner Violence:   . Fear of Current or Ex-Partner:   . Emotionally Abused:   Marland Kitchen Physically Abused:   . Sexually Abused:     PHYSICAL EXAM: Blood pressure 128/81, pulse 68,  height 5\' 3"  (1.6 m), weight 158 lb (71.7 kg), SpO2 100 %. General: No acute distress.  Patient appears well-groomed.   Head:  Normocephalic/atraumatic Eyes:  Fundi examined but not visualized Neck: supple, no paraspinal tenderness, full range of motion Heart:  Regular rate and rhythm Lungs:  Clear to auscultation bilaterally Back: No paraspinal tenderness Neurological Exam: alert and oriented to person, place, and time. Attention span and concentration intact, recent and remote memory intact, fund of knowledge intact.  Speech fluent and not dysarthric, language intact.  CN II-XII intact. Bulk and tone normal, muscle strength 5/5 throughout.  Sensation to light touch, temperature and vibration intact.  Deep tendon reflexes 2+ throughout, toes downgoing.  Finger to nose and heel to shin testing intact.  Gait normal, Romberg negative.  IMPRESSION: Migraine without aura, without status migrainosus, not intractable  PLAN: 1.  For preventative management, Emgality 2.  For abortive therapy, prescribed Zembrace SymTouch 3.  Limit use of pain relievers to no more than 2 days out of week to prevent risk of rebound or medication-overuse headache. 4.  Keep headache diary 5.  Exercise, hydration, caffeine cessation, sleep hygiene, monitor for and avoid triggers 6. Follow up 6 months   , DO  CC: C. Shon Millet, MD

## 2019-10-11 ENCOUNTER — Other Ambulatory Visit: Payer: Self-pay

## 2019-10-11 ENCOUNTER — Ambulatory Visit: Payer: BC Managed Care – PPO | Admitting: Neurology

## 2019-10-11 ENCOUNTER — Encounter: Payer: Self-pay | Admitting: Neurology

## 2019-10-11 VITALS — BP 128/81 | HR 68 | Ht 63.0 in | Wt 158.0 lb

## 2019-10-11 DIAGNOSIS — G43019 Migraine without aura, intractable, without status migrainosus: Secondary | ICD-10-CM | POA: Diagnosis not present

## 2019-10-11 MED ORDER — ZEMBRACE SYMTOUCH 3 MG/0.5ML ~~LOC~~ SOAJ: 3.0000 mg | SUBCUTANEOUS | 11 refills | Status: DC | PRN

## 2019-10-11 NOTE — Patient Instructions (Signed)
1.  We will continue Emgality shot  2.  I prescribed you Zembrace SymTouch for rescue.  It is an autoinjector.  Inject once at earliest onset of migraine.  May repeat once in 1 hour if needed (maximum 2 shots in 24 hours) 3.  Limit use of pain relievers to no more than 2 days out of week to prevent risk of rebound or medication-overuse headache. 4.  Keep headache diary 5.  Follow up in 6 months.

## 2019-10-14 ENCOUNTER — Ambulatory Visit: Payer: BC Managed Care – PPO | Admitting: Psychiatry

## 2019-10-27 ENCOUNTER — Telehealth: Payer: Self-pay

## 2019-10-27 ENCOUNTER — Ambulatory Visit: Payer: BC Managed Care – PPO | Admitting: Psychiatry

## 2019-10-27 NOTE — Telephone Encounter (Signed)
If she has not yet taken it today, can use the Zembrace injection. Thanks

## 2019-10-27 NOTE — Telephone Encounter (Signed)
Pt called states has a migraine since this morning & has taken Ibuprofen,  Took 4 Ibuprofen again twenty minutes ago. Pt wants to know if she can use the Zembrace inj.  Please call pt [phone].

## 2019-11-04 NOTE — Telephone Encounter (Signed)
Left message advising patient it was ok to use Zembrace injection.

## 2019-11-15 DIAGNOSIS — K921 Melena: Secondary | ICD-10-CM | POA: Diagnosis not present

## 2019-11-15 DIAGNOSIS — I10 Essential (primary) hypertension: Secondary | ICD-10-CM | POA: Diagnosis not present

## 2019-11-15 DIAGNOSIS — R300 Dysuria: Secondary | ICD-10-CM | POA: Diagnosis not present

## 2019-11-16 DIAGNOSIS — I10 Essential (primary) hypertension: Secondary | ICD-10-CM | POA: Diagnosis not present

## 2019-11-16 DIAGNOSIS — R457 State of emotional shock and stress, unspecified: Secondary | ICD-10-CM | POA: Diagnosis not present

## 2019-11-17 DIAGNOSIS — R1013 Epigastric pain: Secondary | ICD-10-CM | POA: Diagnosis not present

## 2019-11-17 DIAGNOSIS — R1310 Dysphagia, unspecified: Secondary | ICD-10-CM | POA: Diagnosis not present

## 2019-11-17 DIAGNOSIS — K625 Hemorrhage of anus and rectum: Secondary | ICD-10-CM | POA: Diagnosis not present

## 2019-11-17 DIAGNOSIS — K219 Gastro-esophageal reflux disease without esophagitis: Secondary | ICD-10-CM | POA: Diagnosis not present

## 2019-11-25 ENCOUNTER — Ambulatory Visit: Payer: BC Managed Care – PPO | Admitting: Psychiatry

## 2019-12-14 ENCOUNTER — Other Ambulatory Visit: Payer: Self-pay | Admitting: Psychiatry

## 2019-12-14 DIAGNOSIS — F411 Generalized anxiety disorder: Secondary | ICD-10-CM

## 2019-12-14 DIAGNOSIS — F4010 Social phobia, unspecified: Secondary | ICD-10-CM

## 2019-12-14 DIAGNOSIS — F4001 Agoraphobia with panic disorder: Secondary | ICD-10-CM

## 2019-12-14 DIAGNOSIS — F331 Major depressive disorder, recurrent, moderate: Secondary | ICD-10-CM

## 2019-12-15 ENCOUNTER — Other Ambulatory Visit: Payer: Self-pay | Admitting: Neurology

## 2019-12-15 NOTE — Telephone Encounter (Signed)
Last apt 05/2019 due back Feb

## 2019-12-15 NOTE — Telephone Encounter (Signed)
Call to RS

## 2019-12-15 NOTE — Telephone Encounter (Signed)
Patient's spouse called and requested refills on Emgality for the patient. He said they've already contacted the pharmacy to no avail. Patient reportedly currently has a headache.  Karin Golden on [id number]

## 2019-12-16 ENCOUNTER — Encounter: Payer: Self-pay | Admitting: *Deleted

## 2019-12-16 ENCOUNTER — Other Ambulatory Visit: Payer: Self-pay

## 2019-12-16 MED ORDER — EMGALITY 120 MG/ML ~~LOC~~ SOAJ: 120.0000 mg | SUBCUTANEOUS | 5 refills | Status: DC

## 2019-12-16 NOTE — Telephone Encounter (Signed)
Medication refilled

## 2019-12-16 NOTE — Progress Notes (Addendum)
Solveig Smay KeyNorlene Campbell - Rx #: [id number] Need help? Call us at [phone] Outcome Approvedtoday Effective from 12/16/2019 through 12/14/2020. Drug Emgality 120MG /ML auto-injectors (migraine) Form Blue Form (CB) Original Claim Info 75

## 2019-12-17 ENCOUNTER — Telehealth: Payer: Self-pay | Admitting: Neurology

## 2019-12-17 NOTE — Telephone Encounter (Signed)
Yes

## 2019-12-17 NOTE — Telephone Encounter (Signed)
Pt states that the Emgality needs a prior auth. She states that the pharmacy was supposed to send [place] something and she wants to check the status of the prior auth  Please call

## 2019-12-17 NOTE — Telephone Encounter (Signed)
Advised pt that her PA was started 12/16/19.   Pt wanted to know in the meantime can she come pick up a sample until her PA is approved?

## 2019-12-17 NOTE — Telephone Encounter (Signed)
Pt to come get sample of Emgality. It will have her name on it  in the fridge.

## 2019-12-17 NOTE — Telephone Encounter (Signed)
Patient came by and picked up samples.

## 2019-12-21 NOTE — Telephone Encounter (Signed)
[name] called to request refill of her Paxil.  Send to [facility] on [address].  Made appt for 6/24.

## 2020-01-05 ENCOUNTER — Other Ambulatory Visit: Payer: Self-pay | Admitting: Psychiatry

## 2020-01-05 DIAGNOSIS — F5105 Insomnia due to other mental disorder: Secondary | ICD-10-CM

## 2020-01-05 DIAGNOSIS — F331 Major depressive disorder, recurrent, moderate: Secondary | ICD-10-CM

## 2020-01-20 ENCOUNTER — Ambulatory Visit (INDEPENDENT_AMBULATORY_CARE_PROVIDER_SITE_OTHER): Payer: BC Managed Care – PPO | Admitting: Psychiatry

## 2020-01-20 ENCOUNTER — Other Ambulatory Visit: Payer: Self-pay

## 2020-01-20 ENCOUNTER — Encounter: Payer: Self-pay | Admitting: Psychiatry

## 2020-01-20 DIAGNOSIS — F4001 Agoraphobia with panic disorder: Secondary | ICD-10-CM | POA: Diagnosis not present

## 2020-01-20 DIAGNOSIS — Z79899 Other long term (current) drug therapy: Secondary | ICD-10-CM

## 2020-01-20 DIAGNOSIS — F331 Major depressive disorder, recurrent, moderate: Secondary | ICD-10-CM | POA: Diagnosis not present

## 2020-01-20 DIAGNOSIS — F4010 Social phobia, unspecified: Secondary | ICD-10-CM | POA: Diagnosis not present

## 2020-01-20 DIAGNOSIS — F411 Generalized anxiety disorder: Secondary | ICD-10-CM

## 2020-01-20 DIAGNOSIS — F900 Attention-deficit hyperactivity disorder, predominantly inattentive type: Secondary | ICD-10-CM

## 2020-01-20 DIAGNOSIS — F5105 Insomnia due to other mental disorder: Secondary | ICD-10-CM

## 2020-01-20 MED ORDER — QUETIAPINE FUMARATE 100 MG PO TABS: 100.0000 mg | ORAL_TABLET | Freq: Every day | ORAL | 1 refills | Status: DC

## 2020-01-20 MED ORDER — LAMOTRIGINE 150 MG PO TABS: 150.0000 mg | ORAL_TABLET | Freq: Two times a day (BID) | ORAL | 1 refills | Status: DC

## 2020-01-20 MED ORDER — PAROXETINE HCL 30 MG PO TABS: 30.0000 mg | ORAL_TABLET | Freq: Every day | ORAL | 1 refills | Status: DC

## 2020-01-20 MED ORDER — LITHIUM CARBONATE ER 300 MG PO TBCR: 900.0000 mg | EXTENDED_RELEASE_TABLET | Freq: Every day | ORAL | 1 refills | Status: DC

## 2020-01-20 NOTE — Progress Notes (Signed)
[name] [id number] Aug 17, 1970 49 y.o.  Subjective:   Patient ID:  [name] is a 49 y.o. (DOB 07-07-1971) female.  Chief Complaint:  Chief Complaint  Patient presents with  . Follow-up  . Depression  . 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  At appt Sep 02, 2018. Raised Concerta to 27 mg but then she had anxiety and couple panic and so it was reduced to 18 mg and anxiety resolved.  Last appointment May 2020.  No meds were changed.  November 2020 appointment with the following noted: Out of the stimulant.  Didn't pursue it as not critical..  Overall OK considering social matters. Concerned about her memory.  Loses track in conversation.  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 Overall pretty good mood.  Still tired in the am but less so with IR Seroquel lower dose..  Some anxiety with Covid.  Wasn't following thoughts to dark and scary places.  Loves being at home so ok. Getting things done and that feels really good.  Put off home schooling until July.  Better mood. Bc tremor she reduced lithium from 1200 to 900 on April 15 and the tremor is better not gone and feels the same.  No SI since here. Still has to nap and rest at times with Concerta but it seemed to help, but hard to explain.  Clarity better.  Easier to make decisions.  Don't feel as daft.  Still hard listening to sermons, drift off all my life.  Is doing better with home schooling.  Still night eating.  Sleep 8-10 hours. Plan restart Concerta and check labs for cognition.  01/20/2020 appointment with the following noted: Patient should have been seen since November before this time. Did not do well with Concerta 18  bc too anxious. Perimenopausal.  Moody.  Some irritability. Struggling a lot with migraine but seems to be getting better.  Gets emotionally overwhelmed but not real dark moods.   Some sadness. Productivity is better lately. 1 panic since here and thought was having MI.  Anxiety is better now.  Tax season hard. Sleep great with routine.  Fidgety all the time is somewhat embarrassing.  No discomfort. Started diet and less nighteating now over the last couple of weeks.  Past Psychiatric Medication Trials:  Concerta 18 SE, modafinil 200 side effect, Ritalin, Loxitane 60, lamotrigine 300,  lithium, Seroquel XR 600, Abilify, Rexulti side effects, Depakote side effects, risperidone buspirone 30 twice daily, propranolol, gabapentin, topiramate, , imipramine, venlafaxine, sertraline, Lexapro 20 nr, Wellbutrin,  selegiline, duloxetine side effects, temazepam Deplin,   Relpax depression  Review of Systems:  Negative for weakness, + weight gain, trmor, connstipation , reflux, some HA, hearing is getting worse.  Medications: I have reviewed the patient's current medications.  Current Outpatient Medications  Medication Sig Dispense Refill  . Acetylcysteine 600 MG CAPS Take 600 mg by mouth 2 (two) times daily.    Marland Kitchen b complex vitamins tablet Take 1 tablet by mouth daily.    . Coenzyme Q10 (CO Q 10) 100 MG CAPS Take 200 mg by mouth daily.    Tery Sanfilippo Calcium (STOOL SOFTENER PO) Take by mouth 3 (three) times daily.    . Galcanezumab-gnlm (EMGALITY) 120 MG/ML SOAJ Inject 120 mg into the skin every 30 (thirty) days. 1 pen 5  . lamoTRIgine (LAMICTAL) 150 MG tablet Take 1 tablet (150 mg  total) by mouth 2 (two) times daily. 180 tablet 1  . levothyroxine (SYNTHROID, LEVOTHROID) 125 MCG tablet Take 125 mcg by mouth daily before breakfast.     . lithium carbonate (LITHOBID) 300 MG CR tablet Take 3 tablets (900 mg total) by mouth at bedtime. 270 tablet 1  . Magnesium 200 MG TABS Take by mouth daily.    . Multiple Vitamins-Minerals (WOMENS MULTIVITAMIN PLUS PO) Take 1 tablet by mouth daily.     Marland Kitchen PARoxetine (PAXIL) 30 MG tablet Take 1 tablet (30 mg total) by mouth daily. 90 tablet 1  .  Polyethylene Glycol 3350 (MIRALAX PO) Take by mouth daily.    . QUEtiapine (SEROQUEL) 100 MG tablet Take 1 tablet (100 mg total) by mouth at bedtime. 90 tablet 1  . SUMAtriptan Succinate (ZEMBRACE SYMTOUCH) 3 MG/0.5ML SOAJ Inject 3 mg into the skin as needed (May repeat in 1 hour.  Maximum 2 shots in 24 hours). 6 pen 11  . verapamil (VERELAN PM) 180 MG 24 hr capsule Take 180 mg by mouth at bedtime.     No current facility-administered medications for this visit.    Medication Side Effects: Other: night eating may be related., tremor  Allergies:  Allergies  Allergen Reactions  . Perphenazine Anaphylaxis and Swelling    Throat swelling and facial muscle contractions    . Imipramine Other (See Comments)    Can't remember adverse effect  . Penicillins Rash  . Albuterol   . Codeine   . Other     Phenegran    Past Medical History:  Diagnosis Date  . Depression   . Hashimoto's disease   . Kidney stones   . Migraine     Family History  Problem Relation Age of Onset  . Depression Mother   . Migraines Mother   . Obesity Mother   . Rheum arthritis Sister   . Migraines Sister   . Hypertension Sister     Social History   Socioeconomic History  . Marital status: Married    Spouse name: Acupuncturist  . Number of children: 4  . Years of education: 56  . Highest education level: Some college, no degree  Occupational History  . 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 Smoking status: Never Smoker  . Smokeless tobacco: Never Used  Vaping Use  . Vaping Use: Never used  Substance and Sexual Activity  . Alcohol use: No  . Drug use: No  . Sexual activity: Yes    Partners: Male    Birth control/protection: Other-see comments    Comment: husband-vasectomy  Other Topics Concern  . Not on file  Social History Narrative   Patient is right-handed. She lives with her husband and 4 children in a one level home. She drinks one glass of tea (8-16 oz) a day.  She does not exercise. Some college.   Social Determinants of Health   Financial Resource Strain:   . Difficulty of Paying Living Expenses:   Food Insecurity:   . Worried About Programme researcher, broadcasting/film/video in the Last Year:   . Barista in the Last Year:   Transportation Needs:   . Freight forwarder (Medical):   Marland Kitchen Lack of Transportation (Non-Medical):   Physical Activity:   . Days of Exercise per Week:   . Minutes of Exercise per Session:   Stress:   . Feeling of Stress :   Social Connections:   .  Frequency of Communication with Friends and Family:   . Frequency of Social Gatherings with Friends and Family:   . Attends Religious Services:   . Active Member of Clubs or Organizations:   . Attends Banker Meetings:   Marland Kitchen Marital Status:   Intimate Partner Violence:   . Fear of Current or Ex-Partner:   . Emotionally Abused:   Marland Kitchen Physically Abused:   . Sexually Abused:     Past Medical History, Surgical history, Social history, and Family history were reviewed and updated as appropriate.   Please see review of systems for further details on the patient's review from today.   Objective:   Physical Exam:  There were no vitals taken for this visit.  Physical Exam Constitutional:      General: She is not in acute distress.    Appearance: She is well-developed.  Musculoskeletal:        General: No deformity.  Neurological:     Mental Status: She is alert and oriented to person, place, and time.     Motor: No tremor.     Coordination: Coordination normal.     Gait: Gait normal.  Psychiatric:        Attention and Perception: She is inattentive.        Mood and Affect: Mood is not anxious or depressed. Affect is not labile, blunt, angry or inappropriate.        Speech: Speech normal.        Behavior: Behavior normal.        Thought Content: Thought content normal. Thought content does not include homicidal or suicidal ideation. Thought content does not include  homicidal or suicidal plan.        Cognition and Memory: Cognition normal.        Judgment: Judgment normal.     Comments: Insight and general self-awareness is moderately impaired chronically.     Lab Review:     Component Value Date/Time   NA 139 08/23/2013 1600   K 4.0 08/23/2013 1600   CL 101 08/23/2013 1600   CO2 27 08/23/2013 1600   GLUCOSE 111 (H) 08/23/2013 1600   BUN 12 08/23/2013 1600   CREATININE 0.85 08/23/2013 1600   CALCIUM 9.3 08/23/2013 1600   PROT 8.2 08/23/2013 1600   ALBUMIN 3.9 08/23/2013 1600   AST 18 08/23/2013 1600   ALT 14 08/23/2013 1600   ALKPHOS 72 08/23/2013 1600   BILITOT 0.3 08/23/2013 1600   GFRNONAA 84 (L) 08/23/2013 1600   GFRAA >90 08/23/2013 1600       Component Value Date/Time   WBC 6.4 08/23/2013 1600   RBC 4.78 08/23/2013 1600   HGB 12.7 08/23/2013 1600   HCT 39.3 08/23/2013 1600   PLT 249 08/23/2013 1600   MCV 82.2 08/23/2013 1600   MCH 26.6 08/23/2013 1600   MCHC 32.3 08/23/2013 1600   RDW 13.5 08/23/2013 1600   LYMPHSABS 2.1 01/17/2011 1812   MONOABS 0.7 01/17/2011 1812   EOSABS 0.1 01/17/2011 1812   BASOSABS 0.0 01/17/2011 1812    No results found for: POCLITH, LITHIUM   No results found for: PHENYTOIN, PHENOBARB, VALPROATE, CBMZ   .res Assessment: Plan:    Major depressive disorder, recurrent episode, moderate (HCC) - Plan: lamoTRIgine (LAMICTAL) 150 MG tablet, lithium carbonate (LITHOBID) 300 MG CR tablet, PARoxetine (PAXIL) 30 MG tablet, QUEtiapine (SEROQUEL) 100 MG tablet, Basic metabolic panel, Lithium level, TSH  Panic disorder with agoraphobia - Plan: PARoxetine (PAXIL) 30 MG tablet  Social anxiety disorder - Plan: PARoxetine (PAXIL) 30 MG tablet  Generalized anxiety disorder - Plan: PARoxetine (PAXIL) 30 MG tablet  Attention deficit hyperactivity disorder (ADHD), predominantly inattentive type  Insomnia due to mental condition - Plan: QUEtiapine (SEROQUEL) 100 MG tablet  Lithium use - Plan: Basic  metabolic panel, Lithium level, TSH   Hx severe TRD with Suicide attempt.  Insight and general self-awareness is moderately impaired chronically complicating and delaying treatment.  Also she has not kept appointments as frequently as recommended.  She is not having panic attacks and overall depression is reasonably managed though her motivation and energy are low.  She is somewhat chronically dysfunctional to some degree. Disc her questions about memory effects of meds.  Consider reduction of lamotrigine if laboratory tests do not show reasons for memory problems..  For memory complaints check B12 and folate and lamotrigine level.  Also need to check lithium and BMP.  PCP checks thyroid.  Residual problems with home schooling DT tired and not focused.   She may have underlying ADD on top of the obvious impairment from the depression.  Her mood is pretty well managed at the present which is remarkable in and of itself as she has been chronically depressed.  She still has problems with consistent productivity and the need to nap that seems excessive.  So she has a partial benefit from Concerta 18 mg a day.  Unfortunately she stopped taking it when she ran into some difficulty getting a refill however a phone call would have solve that problem.  Encouraged her to be compliant this time.  She had some prior benefit from the Concerta with regard to energy and mood and productivity. Discussed potential benefits, risks, and side effects of stimulants with patient to include increased heart rate, palpitations, insomnia, increased anxiety, increased irritability, or decreased appetite.  Instructed patient to contact office if experiencing any significant tolerability issues.  She still night eating which might possibly be related to quetiapine.  We may consider trying to reduce that further once we address the memory issues.  Discussed potential metabolic side effects associated with atypical antipsychotics, as  well as potential risk for movement side effects. Advised pt to contact office if movement side effects occur. Seroquel needed for sleep and mood.  But may be contributing to the night eating and constipation.  Counseled patient regarding potential benefits, risks, and side effects of lithium to include potential risk of lithium affecting thyroid and renal function.  Discussed need for periodic lab monitoring to determine drug level and to assess for potential adverse effects.  Counseled patient regarding signs and symptoms of lithium toxicity and advised that they notify office immediately or seek urgent medical attention if experiencing these signs and symptoms.  Patient advised to contact office with any questions or concerns.  Rec check labs.  Constipation managed  Option off label Aricept.  This appt was 30 mins.  FU 6 mos  Lynder Parents, MD, DFAPA   Please see After Visit Summary for patient specific instructions.  Future Appointments  Date Time Provider [place]  04/13/2020  8:50 AM Pieter Partridge, DO [facility] None    Orders Placed This Encounter  Procedures  . Basic metabolic panel  . Lithium level  . TSH      -------------------------------

## 2020-01-25 DIAGNOSIS — M79631 Pain in right forearm: Secondary | ICD-10-CM | POA: Diagnosis not present

## 2020-01-25 DIAGNOSIS — M79632 Pain in left forearm: Secondary | ICD-10-CM | POA: Diagnosis not present

## 2020-01-26 DIAGNOSIS — M79632 Pain in left forearm: Secondary | ICD-10-CM | POA: Diagnosis not present

## 2020-01-26 DIAGNOSIS — M79631 Pain in right forearm: Secondary | ICD-10-CM | POA: Diagnosis not present

## 2020-02-08 DIAGNOSIS — M79632 Pain in left forearm: Secondary | ICD-10-CM | POA: Diagnosis not present

## 2020-02-08 DIAGNOSIS — M79631 Pain in right forearm: Secondary | ICD-10-CM | POA: Diagnosis not present

## 2020-02-09 DIAGNOSIS — M9901 Segmental and somatic dysfunction of cervical region: Secondary | ICD-10-CM | POA: Diagnosis not present

## 2020-02-09 DIAGNOSIS — M5032 Other cervical disc degeneration, mid-cervical region, unspecified level: Secondary | ICD-10-CM | POA: Diagnosis not present

## 2020-02-09 DIAGNOSIS — M542 Cervicalgia: Secondary | ICD-10-CM | POA: Diagnosis not present

## 2020-02-09 DIAGNOSIS — M9903 Segmental and somatic dysfunction of lumbar region: Secondary | ICD-10-CM | POA: Diagnosis not present

## 2020-02-10 DIAGNOSIS — M9903 Segmental and somatic dysfunction of lumbar region: Secondary | ICD-10-CM | POA: Diagnosis not present

## 2020-02-10 DIAGNOSIS — M542 Cervicalgia: Secondary | ICD-10-CM | POA: Diagnosis not present

## 2020-02-10 DIAGNOSIS — M9901 Segmental and somatic dysfunction of cervical region: Secondary | ICD-10-CM | POA: Diagnosis not present

## 2020-02-10 DIAGNOSIS — M5032 Other cervical disc degeneration, mid-cervical region, unspecified level: Secondary | ICD-10-CM | POA: Diagnosis not present

## 2020-02-11 DIAGNOSIS — M9901 Segmental and somatic dysfunction of cervical region: Secondary | ICD-10-CM | POA: Diagnosis not present

## 2020-02-11 DIAGNOSIS — M542 Cervicalgia: Secondary | ICD-10-CM | POA: Diagnosis not present

## 2020-02-11 DIAGNOSIS — M5032 Other cervical disc degeneration, mid-cervical region, unspecified level: Secondary | ICD-10-CM | POA: Diagnosis not present

## 2020-02-11 DIAGNOSIS — M9903 Segmental and somatic dysfunction of lumbar region: Secondary | ICD-10-CM | POA: Diagnosis not present

## 2020-02-14 DIAGNOSIS — M9903 Segmental and somatic dysfunction of lumbar region: Secondary | ICD-10-CM | POA: Diagnosis not present

## 2020-02-14 DIAGNOSIS — M542 Cervicalgia: Secondary | ICD-10-CM | POA: Diagnosis not present

## 2020-02-14 DIAGNOSIS — M5032 Other cervical disc degeneration, mid-cervical region, unspecified level: Secondary | ICD-10-CM | POA: Diagnosis not present

## 2020-02-14 DIAGNOSIS — M9901 Segmental and somatic dysfunction of cervical region: Secondary | ICD-10-CM | POA: Diagnosis not present

## 2020-02-15 DIAGNOSIS — M542 Cervicalgia: Secondary | ICD-10-CM | POA: Diagnosis not present

## 2020-02-15 DIAGNOSIS — M9903 Segmental and somatic dysfunction of lumbar region: Secondary | ICD-10-CM | POA: Diagnosis not present

## 2020-02-15 DIAGNOSIS — M9901 Segmental and somatic dysfunction of cervical region: Secondary | ICD-10-CM | POA: Diagnosis not present

## 2020-02-15 DIAGNOSIS — M5032 Other cervical disc degeneration, mid-cervical region, unspecified level: Secondary | ICD-10-CM | POA: Diagnosis not present

## 2020-02-16 NOTE — Progress Notes (Signed)
NEUROLOGY FOLLOW UP OFFICE NOTE  [facility] [id number]  HISTORY OF PRESENT ILLNESS: [name] is a 49 year oldright-handedfemale whofollows up for migraines.  UPDATE: Tried Valla Leaver.  It worked well once, at earliest onset.  The others didn't work but she was already in migraine.  She has had increase in migraine frequency.  She saw a chiropractor and has not had a migraine in a week. Intensity:  Mild, severe Frequency:  15 migraine days in past 30 days. Current NSAIDS:Ibuprofen 800mg  Current analgesics:none Current triptans:Zembrace SymTouch Current ergotamine:none Current anti-emetic:none Current muscle relaxants:none Current anti-anxiolytic:none Current sleep aide:Seroquel Current Antihypertensive medications:Verapamil 24h 180mg  daily Current Antidepressant/mood medications:Paroxetine 30mg ; Lithium Current Anticonvulsant medications:lamotirigine 150mg  twice daily Current anti-CGRP:Emgality Current Vitamins/Herbal/Supplements:Magnesium, B-complex, CoQ10 (in MVI) Current Antihistamines/Decongestants:none Other therapy: Chiropractic therapy Hormone/birth control:none Other medications:Methylphenidate, Synthroid  Caffeine:1 glass of iced tea daily. No coffee. Alcohol:no Smoker:no Diet:Started a new diet last week. Exercise:Not routine but working to use the eliptical Depression:yes; Anxiety:Not currently. Other pain:Some aches and pains Sleep hygiene:Good with Seroquel.  HISTORY: Onset:teenager Location:Mostly right-sided, sometimes left-sided or back of neck Quality:Pressure, throbbing Intensity:Usually 2-4/10. Occasionally 8/10 (2 to 3 times a month).Shedenies new headache or thunderclap headache Aura:no Premonitory Phase:A very vague mild headache or "presence" Postdrome:hangover effect/fatigue, scalp tenderness Associated symptoms:Photophobia,phonophobia,  nausea,tinnitus.She denies associatedvomiting, visual disturbance,unilateral numbness or weakness. Duration:Always a constant headache. Severe migraines last 3 days. Frequency:Constant headache. Severe headaches 2-3 times a month, about 9-10 days total a month) Frequency of abortive medication:ibuprofen daily Triggers:Emotional stress, menstrual cycle Relieving factors:none Activity:Severe aggravates  05/14/11 MRI Brain w/wo personally reviewed: Normal 05/14/11 MRA Head personally reviewed: Negative 01/12/18 CT head personally reviewed: Normal 01/12/18 CT Cervical spine personally reviewed: mild generalized facet spurring with no evidence of cord impingement.  Past NSAIDS:Naproxen, flurbiprofen Past steroids: prednisone Past analgesics:Stadol, tramadol, Tylenol, codeine, lorcet, percocet Past abortive triptans:Sumatriptan tablet, Maxalt, Relpax, Zomig, Amerge Past abortive ergotamine:None Past muscle relaxants:none Past anti-emetic:Phenergan, Zofran, Reglan Past antihypertensive medications:propranolol Past antidepressant medications:Imipramine, Zoloft, Wellbutrin, Cymbalta, Lexapro, Effexor Past anticonvulsant medications:Topiramate, gabapentin Past anti-CGRP:Ubrelvy (ineffective, caused paresthesias); Nurtec Past vitamins/Herbal/Supplements:none Past antihistamines/decongestants:Dramamine, benadryl Other past therapies:none   Family history of headache:Mom, sister Other family history: Maternal GM (strokes)  PAST MEDICAL HISTORY: Past Medical History:  Diagnosis Date  . Depression   . Hashimoto's disease   . Kidney stones   . Migraine     MEDICATIONS: Current Outpatient Medications on File Prior to Visit  Medication Sig Dispense Refill  . Acetylcysteine 600 MG CAPS Take 600 mg by mouth 2 (two) times daily.    05/16/11 b complex vitamins tablet Take 1 tablet by mouth daily.    . Coenzyme Q10 (CO Q 10) 100 MG CAPS Take 200 mg  by mouth daily.    05/16/11 Calcium (STOOL SOFTENER PO) Take by mouth 3 (three) times daily.    . Galcanezumab-gnlm (EMGALITY) 120 MG/ML SOAJ Inject 120 mg into the skin every 30 (thirty) days. 1 pen 5  . lamoTRIgine (LAMICTAL) 150 MG tablet Take 1 tablet (150 mg total) by mouth 2 (two) times daily. 180 tablet 1  . levothyroxine (SYNTHROID, LEVOTHROID) 125 MCG tablet Take 125 mcg by mouth daily before breakfast.     . lithium carbonate (LITHOBID) 300 MG CR tablet Take 3 tablets (900 mg total) by mouth at bedtime. 270 tablet 1  . Magnesium 200 MG TABS Take by mouth daily.    . Multiple Vitamins-Minerals (WOMENS MULTIVITAMIN PLUS PO) Take 1 tablet by mouth daily.     01/14/18  PARoxetine (PAXIL) 30 MG tablet Take 1 tablet (30 mg total) by mouth daily. 90 tablet 1  . Polyethylene Glycol 3350 (MIRALAX PO) Take by mouth daily.    . QUEtiapine (SEROQUEL) 100 MG tablet Take 1 tablet (100 mg total) by mouth at bedtime. 90 tablet 1  . SUMAtriptan Succinate (ZEMBRACE SYMTOUCH) 3 MG/0.5ML SOAJ Inject 3 mg into the skin as needed (May repeat in 1 hour.  Maximum 2 shots in 24 hours). 6 pen 11  . verapamil (VERELAN PM) 180 MG 24 hr capsule Take 180 mg by mouth at bedtime.     No current facility-administered medications on file prior to visit.    ALLERGIES: Allergies  Allergen Reactions  . Perphenazine Anaphylaxis and Swelling    Throat swelling and facial muscle contractions    . Imipramine Other (See Comments)    Can't remember adverse effect  . Penicillins Rash  . Albuterol   . Codeine   . Other     Phenegran    FAMILY HISTORY: Family History  Problem Relation Age of Onset  . Depression Mother   . Migraines Mother   . Obesity Mother   . Rheum arthritis Sister   . Migraines Sister   . Hypertension Sister    SOCIAL HISTORY: Social History   Socioeconomic History  . Marital status: Married    Spouse name: Acupuncturist  . Number of children: 4  . Years of education: 65  . Highest education  level: Some college, no degree  Occupational History  . 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 Smoking status: Never Smoker  . Smokeless tobacco: Never Used  Vaping Use  . Vaping Use: Never used  Substance and Sexual Activity  . Alcohol use: No  . Drug use: No  . Sexual activity: Yes    Partners: Male    Birth control/protection: Other-see comments    Comment: husband-vasectomy  Other Topics Concern  . Not on file  Social History Narrative   Patient is right-handed. She lives with her husband and 4 children in a one level home. She drinks one glass of tea (8-16 oz) a day. She does not exercise. Some college.   Social Determinants of Health   Financial Resource Strain:   . Difficulty of Paying Living Expenses:   Food Insecurity:   . Worried About Programme researcher, broadcasting/film/video in the Last Year:   . Barista in the Last Year:   Transportation Needs:   . Freight forwarder (Medical):   Marland Kitchen Lack of Transportation (Non-Medical):   Physical Activity:   . Days of Exercise per Week:   . Minutes of Exercise per Session:   Stress:   . Feeling of Stress :   Social Connections:   . Frequency of Communication with Friends and Family:   . Frequency of Social Gatherings with Friends and Family:   . Attends Religious Services:   . Active Member of Clubs or Organizations:   . Attends Banker Meetings:   Marland Kitchen Marital Status:   Intimate Partner Violence:   . Fear of Current or Ex-Partner:   . Emotionally Abused:   Marland Kitchen Physically Abused:   . Sexually Abused:    PHYSICAL EXAM: Blood pressure (!) 145/80, pulse 78, height 5\' 3"  (1.6 m), weight 145 lb 12.8 oz (66.1 kg), SpO2 98 %. General: No acute distress.  Patient appears well-groomed.   Head:  Normocephalic/atraumatic Eyes:  Fundi examined  but not visualized Neck: supple, no paraspinal tenderness, full range of motion Heart:  Regular rate and rhythm Lungs:  Clear to auscultation  bilaterally Back: No paraspinal tenderness Neurological Exam: alert and oriented to person, place, and time. Attention span and concentration intact, recent and remote memory intact, fund of knowledge intact.  Speech fluent and not dysarthric, language intact.  CN II-XII intact. Bulk and tone normal, muscle strength 5/5 throughout.  Sensation to light touch, temperature and vibration intact.  Deep tendon reflexes 2+ throughout, toes downgoing.  Finger to nose and heel to shin testing intact.  Gait normal, Romberg negative.  IMPRESSION: 1.  Migraine without aura, without status migrainosus, not intractable 2.  Major depressive disorder, recurrent episode  PLAN: 1.  For preventative management, Emgality.  We will see if headaches continue to improve with chiropractic treatment.  If not, then switch to Aimovig 2.  For abortive therapy, I will have her try Tosymra 3.  Limit use of pain relievers to no more than 2 days out of week to prevent risk of rebound or medication-overuse headache. 4.  Keep headache diary 5.  Exercise, hydration, caffeine cessation, sleep hygiene, monitor for and avoid triggers 6.  Follow up 6 months   Shon Millet, DO  CC: C. Duane Lope, MD

## 2020-02-18 ENCOUNTER — Encounter: Payer: Self-pay | Admitting: Neurology

## 2020-02-18 ENCOUNTER — Other Ambulatory Visit: Payer: Self-pay

## 2020-02-18 ENCOUNTER — Ambulatory Visit: Payer: BC Managed Care – PPO | Admitting: Neurology

## 2020-02-18 VITALS — BP 145/80 | HR 78 | Ht 63.0 in | Wt 145.8 lb

## 2020-02-18 DIAGNOSIS — G43709 Chronic migraine without aura, not intractable, without status migrainosus: Secondary | ICD-10-CM | POA: Diagnosis not present

## 2020-02-18 NOTE — Patient Instructions (Signed)
°  1. Continue Emgality 2. Next time you wake up with a migraine, take Tosymra 1 spray into nose.  May repeat dose once in 1 hour if needed.  Maximum 2 tablets in 24 hours.  If effective, contact me for prescription 3. Limit use of pain relievers to no more than 2 days out of the week.  These medications include acetaminophen, NSAIDs (ibuprofen/Advil/Motrin, naproxen/Aleve, triptans (Imitrex/sumatriptan), Excedrin, and narcotics.  This will help reduce risk of rebound headaches. 4. Be aware of common food triggers:  - Caffeine:  coffee, black tea, cola, Mt. Dew  - Chocolate  - Dairy:  aged cheeses (brie, blue, cheddar, gouda, [place], provolone, [place], Swiss, etc), chocolate milk, buttermilk, sour cream, limit eggs and yogurt  - Nuts, peanut butter  - Alcohol  - Cereals/grains:  FRESH breads (fresh bagels, sourdough, doughnuts), yeast productions  - Processed/canned/aged/cured meats (pre-packaged deli meats, hotdogs)  - MSG/glutamate:  soy sauce, flavor enhancer, pickled/preserved/marinated foods  - Sweeteners:  aspartame (Equal, Nutrasweet).  Sugar and Splenda are okay  - Vegetables:  legumes (lima beans, lentils, snow peas, fava beans, pinto peans, peas, garbanzo beans), sauerkraut, onions, olives, pickles  - Fruit:  avocados, bananas, citrus fruit (orange, lemon, grapefruit), mango  - Other:  Frozen meals, macaroni and cheese 5. Routine exercise 6. Stay adequately hydrated (aim for 64 oz water daily) 7. Keep headache diary 8. Maintain proper stress management 9. Maintain proper sleep hygiene 10. Do not skip meals 11. Consider supplements:  magnesium citrate 400mg  daily, riboflavin 400mg  daily, coenzyme Q10 100mg  three times daily.

## 2020-02-21 DIAGNOSIS — M9901 Segmental and somatic dysfunction of cervical region: Secondary | ICD-10-CM | POA: Diagnosis not present

## 2020-02-21 DIAGNOSIS — M9903 Segmental and somatic dysfunction of lumbar region: Secondary | ICD-10-CM | POA: Diagnosis not present

## 2020-02-21 DIAGNOSIS — M5032 Other cervical disc degeneration, mid-cervical region, unspecified level: Secondary | ICD-10-CM | POA: Diagnosis not present

## 2020-02-21 DIAGNOSIS — M542 Cervicalgia: Secondary | ICD-10-CM | POA: Diagnosis not present

## 2020-02-23 DIAGNOSIS — M542 Cervicalgia: Secondary | ICD-10-CM | POA: Diagnosis not present

## 2020-02-23 DIAGNOSIS — M5032 Other cervical disc degeneration, mid-cervical region, unspecified level: Secondary | ICD-10-CM | POA: Diagnosis not present

## 2020-02-23 DIAGNOSIS — M9903 Segmental and somatic dysfunction of lumbar region: Secondary | ICD-10-CM | POA: Diagnosis not present

## 2020-02-23 DIAGNOSIS — M9901 Segmental and somatic dysfunction of cervical region: Secondary | ICD-10-CM | POA: Diagnosis not present

## 2020-02-25 DIAGNOSIS — M5032 Other cervical disc degeneration, mid-cervical region, unspecified level: Secondary | ICD-10-CM | POA: Diagnosis not present

## 2020-02-25 DIAGNOSIS — M9903 Segmental and somatic dysfunction of lumbar region: Secondary | ICD-10-CM | POA: Diagnosis not present

## 2020-02-25 DIAGNOSIS — M9901 Segmental and somatic dysfunction of cervical region: Secondary | ICD-10-CM | POA: Diagnosis not present

## 2020-02-25 DIAGNOSIS — M542 Cervicalgia: Secondary | ICD-10-CM | POA: Diagnosis not present

## 2020-02-28 DIAGNOSIS — M9903 Segmental and somatic dysfunction of lumbar region: Secondary | ICD-10-CM | POA: Diagnosis not present

## 2020-02-28 DIAGNOSIS — M9901 Segmental and somatic dysfunction of cervical region: Secondary | ICD-10-CM | POA: Diagnosis not present

## 2020-02-28 DIAGNOSIS — M542 Cervicalgia: Secondary | ICD-10-CM | POA: Diagnosis not present

## 2020-02-28 DIAGNOSIS — M5032 Other cervical disc degeneration, mid-cervical region, unspecified level: Secondary | ICD-10-CM | POA: Diagnosis not present

## 2020-02-29 DIAGNOSIS — M79631 Pain in right forearm: Secondary | ICD-10-CM | POA: Diagnosis not present

## 2020-02-29 DIAGNOSIS — M79632 Pain in left forearm: Secondary | ICD-10-CM | POA: Diagnosis not present

## 2020-03-08 ENCOUNTER — Encounter (HOSPITAL_COMMUNITY): Payer: Self-pay

## 2020-03-08 ENCOUNTER — Other Ambulatory Visit: Payer: Self-pay

## 2020-03-08 ENCOUNTER — Emergency Department (HOSPITAL_COMMUNITY)
Admission: EM | Admit: 2020-03-08 | Discharge: 2020-03-08 | Disposition: A | Discharge status: Home / Self Care | Payer: BC Managed Care – PPO | Attending: Emergency Medicine | Admitting: Emergency Medicine

## 2020-03-08 ENCOUNTER — Emergency Department (HOSPITAL_COMMUNITY): Payer: BC Managed Care – PPO

## 2020-03-08 DIAGNOSIS — R05 Cough: Secondary | ICD-10-CM | POA: Diagnosis not present

## 2020-03-08 DIAGNOSIS — Z5321 Procedure and treatment not carried out due to patient leaving prior to being seen by health care provider: Secondary | ICD-10-CM | POA: Insufficient documentation

## 2020-03-08 DIAGNOSIS — J029 Acute pharyngitis, unspecified: Secondary | ICD-10-CM | POA: Diagnosis not present

## 2020-03-08 DIAGNOSIS — R509 Fever, unspecified: Secondary | ICD-10-CM | POA: Insufficient documentation

## 2020-03-08 DIAGNOSIS — R21 Rash and other nonspecific skin eruption: Secondary | ICD-10-CM | POA: Diagnosis not present

## 2020-03-08 DIAGNOSIS — U071 COVID-19: Secondary | ICD-10-CM | POA: Insufficient documentation

## 2020-03-08 DIAGNOSIS — R42 Dizziness and giddiness: Secondary | ICD-10-CM | POA: Diagnosis not present

## 2020-03-08 LAB — POC SARS CORONAVIRUS 2 AG: SARS Coronavirus 2 Ag: POSITIVE — AB

## 2020-03-08 NOTE — ED Notes (Signed)
Pt was instructed to sit in the covid section of the lobby however she is refusing because she witnessed another pt vomiting in that area. Pt was told several times that it is our policy that she sit in the covid area of the lobby.  Pt requesting to sit outside.  Per charge RN it is okay for her to sit outside.  I relayed the info to the pt and advised for her to keep her distance from other patients but stay close enough to the doors to hear if we call for her. Pt understood and went outside to wait.

## 2020-03-08 NOTE — ED Triage Notes (Signed)
Arrived POV from home. Patient reports dizziness, cough, fever, and sore throat since Saturday. Patient ambulated to triage room and back to waiting room unassisted. Gait steady

## 2020-04-13 ENCOUNTER — Ambulatory Visit: Payer: BC Managed Care – PPO | Admitting: Neurology

## 2020-04-18 ENCOUNTER — Other Ambulatory Visit: Payer: Self-pay | Admitting: Family Medicine

## 2020-04-18 DIAGNOSIS — R946 Abnormal results of thyroid function studies: Secondary | ICD-10-CM

## 2020-04-18 DIAGNOSIS — M2550 Pain in unspecified joint: Secondary | ICD-10-CM | POA: Diagnosis not present

## 2020-04-18 DIAGNOSIS — E063 Autoimmune thyroiditis: Secondary | ICD-10-CM | POA: Diagnosis not present

## 2020-04-21 DIAGNOSIS — M25561 Pain in right knee: Secondary | ICD-10-CM | POA: Diagnosis not present

## 2020-04-24 ENCOUNTER — Ambulatory Visit
Admission: RE | Admit: 2020-04-24 | Discharge: 2020-04-24 | Disposition: A | Discharge status: Home / Self Care | Payer: BC Managed Care – PPO | Source: Ambulatory Visit | Attending: Family Medicine | Admitting: Family Medicine

## 2020-04-24 DIAGNOSIS — R946 Abnormal results of thyroid function studies: Secondary | ICD-10-CM

## 2020-04-24 DIAGNOSIS — E041 Nontoxic single thyroid nodule: Secondary | ICD-10-CM | POA: Diagnosis not present

## 2020-05-10 DIAGNOSIS — Z79899 Other long term (current) drug therapy: Secondary | ICD-10-CM | POA: Diagnosis not present

## 2020-05-10 DIAGNOSIS — F331 Major depressive disorder, recurrent, moderate: Secondary | ICD-10-CM | POA: Diagnosis not present

## 2020-05-11 LAB — BASIC METABOLIC PANEL
BUN: 17 mg/dL (ref 7–25)
CO2: 28 mmol/L (ref 20–32)
Calcium: 9.9 mg/dL (ref 8.6–10.2)
Chloride: 108 mmol/L (ref 98–110)
Creat: 0.85 mg/dL (ref 0.50–1.10)
Glucose, Bld: 117 mg/dL — ABNORMAL HIGH (ref 65–99)
Potassium: 3.6 mmol/L (ref 3.5–5.3)
Sodium: 142 mmol/L (ref 135–146)

## 2020-05-11 LAB — TSH: TSH: 0.3 mIU/L — ABNORMAL LOW

## 2020-05-11 LAB — LITHIUM LEVEL: Lithium Lvl: 0.7 mmol/L (ref 0.6–1.2)

## 2020-05-12 DIAGNOSIS — M79632 Pain in left forearm: Secondary | ICD-10-CM | POA: Diagnosis not present

## 2020-05-12 DIAGNOSIS — M79631 Pain in right forearm: Secondary | ICD-10-CM | POA: Diagnosis not present

## 2020-05-12 DIAGNOSIS — M25511 Pain in right shoulder: Secondary | ICD-10-CM | POA: Diagnosis not present

## 2020-05-12 DIAGNOSIS — M25512 Pain in left shoulder: Secondary | ICD-10-CM | POA: Diagnosis not present

## 2020-05-19 ENCOUNTER — Telehealth: Payer: Self-pay | Admitting: Psychiatry

## 2020-05-19 NOTE — Telephone Encounter (Signed)
Review.

## 2020-05-19 NOTE — Telephone Encounter (Signed)
Pharmacy called to say that they can no longer get the generic [employer] brand lith carbonate. Can they fill the [facility] generic instead?

## 2020-05-19 NOTE — Telephone Encounter (Signed)
Yes. Any generic OK

## 2020-05-22 NOTE — Telephone Encounter (Signed)
Left message okay to give patient any generic for her Lithium.

## 2020-05-24 DIAGNOSIS — Z6829 Body mass index (BMI) 29.0-29.9, adult: Secondary | ICD-10-CM | POA: Diagnosis not present

## 2020-05-24 DIAGNOSIS — Z1231 Encounter for screening mammogram for malignant neoplasm of breast: Secondary | ICD-10-CM | POA: Diagnosis not present

## 2020-05-24 DIAGNOSIS — Z01419 Encounter for gynecological examination (general) (routine) without abnormal findings: Secondary | ICD-10-CM | POA: Diagnosis not present

## 2020-05-26 DIAGNOSIS — H903 Sensorineural hearing loss, bilateral: Secondary | ICD-10-CM | POA: Diagnosis not present

## 2020-06-07 DIAGNOSIS — K625 Hemorrhage of anus and rectum: Secondary | ICD-10-CM | POA: Diagnosis not present

## 2020-06-27 DIAGNOSIS — L409 Psoriasis, unspecified: Secondary | ICD-10-CM | POA: Diagnosis not present

## 2020-06-27 DIAGNOSIS — R5383 Other fatigue: Secondary | ICD-10-CM | POA: Diagnosis not present

## 2020-06-27 DIAGNOSIS — M2550 Pain in unspecified joint: Secondary | ICD-10-CM | POA: Diagnosis not present

## 2020-06-30 DIAGNOSIS — Z1159 Encounter for screening for other viral diseases: Secondary | ICD-10-CM | POA: Diagnosis not present

## 2020-07-04 DIAGNOSIS — L409 Psoriasis, unspecified: Secondary | ICD-10-CM | POA: Diagnosis not present

## 2020-07-04 DIAGNOSIS — M2550 Pain in unspecified joint: Secondary | ICD-10-CM | POA: Diagnosis not present

## 2020-07-17 ENCOUNTER — Ambulatory Visit: Payer: BC Managed Care – PPO | Admitting: Physician Assistant

## 2020-07-17 ENCOUNTER — Other Ambulatory Visit: Payer: Self-pay | Admitting: Psychiatry

## 2020-07-17 DIAGNOSIS — F411 Generalized anxiety disorder: Secondary | ICD-10-CM

## 2020-07-17 DIAGNOSIS — F4010 Social phobia, unspecified: Secondary | ICD-10-CM

## 2020-07-17 DIAGNOSIS — F4001 Agoraphobia with panic disorder: Secondary | ICD-10-CM

## 2020-07-17 DIAGNOSIS — F331 Major depressive disorder, recurrent, moderate: Secondary | ICD-10-CM

## 2020-07-18 ENCOUNTER — Ambulatory Visit: Payer: BC Managed Care – PPO | Admitting: Psychiatry

## 2020-07-18 NOTE — Telephone Encounter (Signed)
Call to RS

## 2020-07-19 NOTE — Telephone Encounter (Signed)
Left message to call us for an appointment

## 2020-07-31 ENCOUNTER — Ambulatory Visit: Payer: BC Managed Care – PPO | Admitting: Psychiatry

## 2020-07-31 NOTE — Telephone Encounter (Signed)
Call again to [facility] when possible

## 2020-08-20 NOTE — Progress Notes (Deleted)
NEUROLOGY FOLLOW UP OFFICE NOTE  [name] [id number]   Subjective:  [name] is a 50 year old right-handed female who follows up for migraine.  UPDATE: *** Intensity: Mild, severe Frequency: 15 migraine days in past 30 days. Current NSAIDS:Ibuprofen 800mg  Current analgesics:none Current triptans:Tosymra Current ergotamine:none Current anti-emetic:none Current muscle relaxants:none Current anti-anxiolytic:none Current sleep aide:Seroquel Current Antihypertensive medications:Verapamil 24h 180mg  daily Current Antidepressant/moodmedications:Paroxetine 30mg ; Lithium Current Anticonvulsant medications:lamotirigine 150mg  twice daily Current anti-CGRP:Emgality Current Vitamins/Herbal/Supplements:Magnesium, B-complex, CoQ10 (in MVI) Current Antihistamines/Decongestants:none Other therapy: Chiropractic therapy Hormone/birth control:none Other medications:Methylphenidate, Synthroid  Caffeine:1 glass of iced tea daily. No coffee. Alcohol:no Smoker:no Diet:Started a new diet last week. Exercise:Not routine but working to use the eliptical Depression:yes; Anxiety:Not currently. Other pain:Some aches and pains Sleep hygiene:Good with Seroquel.  HISTORY: Onset:teenager Location:Mostly right-sided, sometimes left-sided or back of neck Quality:Pressure, throbbing Intensity:Usually 2-4/10. Occasionally 8/10 (2 to 3 times a month).Shedenies new headache or thunderclap headache Aura:no Premonitory Phase:A very vague mild headache or "presence" Postdrome:hangover effect/fatigue, scalp tenderness Associated symptoms:Photophobia,phonophobia, nausea,tinnitus.She denies associatedvomiting, visual disturbance,unilateral numbness or weakness. Duration:Always a constant headache. Severe migraines last 3 days. Frequency:Constant headache. Severe headaches 2-3 times a month, about 9-10  days total a month) Frequency of abortive medication:ibuprofen daily Triggers:Emotional stress, menstrual cycle Relieving factors:none Activity:Severe aggravates  05/14/11 MRI Brain w/wo personally reviewed: Normal 05/14/11 MRA Head personally reviewed: Negative 01/12/18 CT head personally reviewed: Normal 01/12/18 CT Cervical spine personally reviewed: mild generalized facet spurring with no evidence of cord impingement.  Past NSAIDS:Naproxen, flurbiprofen Past steroids: prednisone Past analgesics:Stadol, tramadol, Tylenol, codeine, lorcet, percocet Past abortive triptans:Sumatriptan tablet, Maxalt, Relpax, Zomig, Amerge, Zembrace SymTouch Past abortive ergotamine:None Past muscle relaxants:none Past anti-emetic:Phenergan, Zofran, Reglan Past antihypertensive medications:propranolol Past antidepressant medications:Imipramine, Zoloft, Wellbutrin, Cymbalta, Lexapro, Effexor Past anticonvulsant medications:Topiramate, gabapentin Past anti-CGRP:Ubrelvy (ineffective, caused paresthesias); Nurtec Past vitamins/Herbal/Supplements:none Past antihistamines/decongestants:Dramamine, benadryl Other past therapies:none   Family history of headache:Mom, sister Other family history: Maternal GM (strokes)  PAST MEDICAL HISTORY: Past Medical History:  Diagnosis Date  . Depression   . Hashimoto's disease   . Kidney stones   . Migraine     MEDICATIONS: Current Outpatient Medications on File Prior to Visit  Medication Sig Dispense Refill  . Acetylcysteine 600 MG CAPS Take 600 mg by mouth 2 (two) times daily.    05/16/11 b complex vitamins tablet Take 1 tablet by mouth daily.    . Coenzyme Q10 (CO Q 10) 100 MG CAPS Take 200 mg by mouth daily.    05/16/11 Calcium (STOOL SOFTENER PO) Take by mouth 3 (three) times daily.    . Galcanezumab-gnlm (EMGALITY) 120 MG/ML SOAJ Inject 120 mg into the skin every 30 (thirty) days. 1 pen 5  . lamoTRIgine (LAMICTAL)  150 MG tablet Take 1 tablet (150 mg total) by mouth 2 (two) times daily. 180 tablet 1  . levothyroxine (SYNTHROID, LEVOTHROID) 125 MCG tablet Take 125 mcg by mouth daily before breakfast.     . lithium carbonate (LITHOBID) 300 MG CR tablet Take 3 tablets (900 mg total) by mouth at bedtime. 270 tablet 1  . Magnesium 200 MG TABS Take by mouth daily.    . Multiple Vitamins-Minerals (WOMENS MULTIVITAMIN PLUS PO) Take 1 tablet by mouth daily.     01/14/18 PARoxetine (PAXIL) 30 MG tablet TAKE ONE TABLET BY MOUTH DAILY 90 tablet 0  . Polyethylene Glycol 3350 (MIRALAX PO) Take by mouth daily.    . QUEtiapine (SEROQUEL) 100 MG tablet Take 1 tablet (100 mg total) by  mouth at bedtime. 90 tablet 1  . SUMAtriptan Succinate (ZEMBRACE SYMTOUCH) 3 MG/0.5ML SOAJ Inject 3 mg into the skin as needed (May repeat in 1 hour.  Maximum 2 shots in 24 hours). 6 pen 11  . verapamil (VERELAN PM) 180 MG 24 hr capsule Take 180 mg by mouth at bedtime.     No current facility-administered medications on file prior to visit.    ALLERGIES: Allergies  Allergen Reactions  . Perphenazine Anaphylaxis and Swelling    Throat swelling and facial muscle contractions    . Imipramine Other (See Comments)    Can't remember adverse effect  . Penicillins Rash  . Albuterol   . Other     Phenegran  . Codeine Rash    FAMILY HISTORY: Family History  Problem Relation Age of Onset  . Depression Mother   . Migraines Mother   . Obesity Mother   . Rheum arthritis Sister   . Migraines Sister   . Hypertension Sister     SOCIAL HISTORY: Social History   Socioeconomic History  . Marital status: Married    Spouse name: Acupuncturist  . Number of children: 4  . Years of education: 44  . Highest education level: Some college, no degree  Occupational History  . 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 Smoking status: Never Smoker  . Smokeless tobacco: Never Used  Vaping Use  . Vaping Use: Never used   Substance and Sexual Activity  . Alcohol use: No  . Drug use: No  . Sexual activity: Yes    Partners: Male    Birth control/protection: Other-see comments    Comment: husband-vasectomy  Other Topics Concern  . Not on file  Social History Narrative   Patient is right-handed. She lives with her husband and 4 children in a one level home. She drinks one glass of tea (8-16 oz) a day. She does not exercise. Some college.   Social Determinants of Health   Financial Resource Strain: Not on file  Food Insecurity: Not on file  Transportation Needs: Not on file  Physical Activity: Not on file  Stress: Not on file  Social Connections: Not on file  Intimate Partner Violence: Not on file     Objective:  *** General: No acute distress.  Patient appears well-groomed.   Head:  Normocephalic/atraumatic Eyes:  Fundi examined but not visualized Neck: supple, no paraspinal tenderness, full range of motion Heart:  Regular rate and rhythm Lungs:  Clear to auscultation bilaterally Back: No paraspinal tenderness Neurological Exam: alert and oriented to person, place, and time. Attention span and concentration intact, recent and remote memory intact, fund of knowledge intact.  Speech fluent and not dysarthric, language intact.  CN II-XII intact. Bulk and tone normal, muscle strength 5/5 throughout.  Sensation to light touch, temperature and vibration intact.  Deep tendon reflexes 2+ throughout, toes downgoing.  Finger to nose and heel to shin testing intact.  Gait normal, Romberg negative.   Assessment/Plan:   1.  Migraine without aura, without status migrainosus, not intractable 2.  Major depressive disorder  1.  Migraine prevention:  *** 2.  Migraine rescue:  *** 3.  Limit use of pain relievers to no more than 2 days out of week to prevent risk of rebound or medication-overuse headache. 4.  Keep headache diary 5.  Follow up 6 months.  Shon Millet, DO  CC: C. Duane Lope, MD

## 2020-08-21 ENCOUNTER — Ambulatory Visit: Payer: BC Managed Care – PPO | Admitting: Neurology

## 2020-08-21 ENCOUNTER — Encounter: Payer: Self-pay | Admitting: Neurology

## 2020-08-21 DIAGNOSIS — Z029 Encounter for administrative examinations, unspecified: Secondary | ICD-10-CM

## 2020-08-22 ENCOUNTER — Other Ambulatory Visit: Payer: Self-pay

## 2020-08-22 ENCOUNTER — Telehealth: Payer: Self-pay | Admitting: Psychiatry

## 2020-08-22 DIAGNOSIS — R110 Nausea: Secondary | ICD-10-CM | POA: Diagnosis not present

## 2020-08-22 DIAGNOSIS — F331 Major depressive disorder, recurrent, moderate: Secondary | ICD-10-CM

## 2020-08-22 DIAGNOSIS — M545 Low back pain, unspecified: Secondary | ICD-10-CM | POA: Diagnosis not present

## 2020-08-22 MED ORDER — LITHIUM CARBONATE ER 300 MG PO TBCR: 900.0000 mg | EXTENDED_RELEASE_TABLET | Freq: Every day | ORAL | 0 refills | Status: DC

## 2020-08-22 NOTE — Telephone Encounter (Signed)
Patient lm requesting a refill on the Lithium. Fill at the [facility] on [facility]. Pt next appt scheduled for 3/16.

## 2020-08-22 NOTE — Telephone Encounter (Signed)
Refill for Lithium submitted.

## 2020-09-05 ENCOUNTER — Ambulatory Visit (INDEPENDENT_AMBULATORY_CARE_PROVIDER_SITE_OTHER): Payer: BC Managed Care – PPO | Admitting: Neurology

## 2020-09-05 ENCOUNTER — Encounter: Payer: Self-pay | Admitting: Neurology

## 2020-09-05 ENCOUNTER — Other Ambulatory Visit: Payer: Self-pay

## 2020-09-05 VITALS — BP 119/76 | HR 88 | Resp 18 | Ht 63.0 in | Wt 146.0 lb

## 2020-09-05 DIAGNOSIS — G43709 Chronic migraine without aura, not intractable, without status migrainosus: Secondary | ICD-10-CM

## 2020-09-05 MED ORDER — EMGALITY 120 MG/ML ~~LOC~~ SOAJ: 120.0000 mg | SUBCUTANEOUS | 5 refills | Status: DC

## 2020-09-05 MED ORDER — TOSYMRA 10 MG/ACT NA SOLN: 10.0000 mg | NASAL | 5 refills | Status: DC | PRN

## 2020-09-05 NOTE — Progress Notes (Signed)
NEUROLOGY FOLLOW UP OFFICE NOTE  [name] [id number]   Subjective:  [name] is a 50 year old right-handed female with history of Hashimoto's, asymmetric hearing loss and migraines who follows up for migraine.  UPDATE: Stopped Emgality in April-May because she forgot one month and thought she was doing okay.  She is going through perimenopause.  Her last period was in March 2021 until 2 weeks ago.  That is when the headaches returned. Intensity: Mild, severe Duration:  1 to 3 days with ibuprofen Frequency: 2 migraines in past 2 weeks. Current NSAIDS:Ibuprofen 800mg  Current analgesics:none Current triptans:Tosymra (never got it) Current ergotamine:none Current anti-emetic:none Current muscle relaxants:none Current anti-anxiolytic:none Current sleep aide:Seroquel Current Antihypertensive medications:Verapamil 24h 180mg  daily Current Antidepressant/moodmedications:Paroxetine 30mg ; Lithium Current Anticonvulsant medications:lamotirigine 150mg  twice daily Current anti-CGRP:none Current Vitamins/Herbal/Supplements:Magnesium, B-complex, CoQ10 (in MVI) Current Antihistamines/Decongestants:none Other therapy: Chiropractic therapy Hormone/birth control:none Other medications:Methylphenidate, Synthroid  Caffeine:1 glass of iced tea daily. No coffee. Alcohol:no Smoker:no Diet:Started a new diet last week. Exercise:Not routine but working to use the eliptical Depression:yes; Anxiety:Not currently. Other pain:Some aches and pains Sleep hygiene:Good with Seroquel.  HISTORY: Onset:teenager Location:Mostly right-sided, sometimes left-sided or back of neck Quality:Pressure, throbbing Intensity:Usually 2-4/10. Occasionally 8/10 (2 to 3 times a month).Shedenies new headache or thunderclap headache Aura:no Premonitory Phase:A very vague mild headache or "presence" Postdrome:hangover  effect/fatigue, scalp tenderness Associated symptoms:Photophobia,phonophobia, nausea,tinnitus.She denies associatedvomiting, visual disturbance,unilateral numbness or weakness. Duration:Always a constant headache. Severe migraines last 3 days. Frequency:Constant headache. Severe headaches 2-3 times a month, about 9-10 days total a month) Frequency of abortive medication:ibuprofen daily Triggers:Emotional stress, menstrual cycle Relieving factors:none Activity:Severe aggravates  05/14/11 MRI Brain w/wo personally reviewed: Normal 05/14/11 MRA Head personally reviewed: Negative 01/12/18 CT head personally reviewed: Normal 01/12/18 CT Cervical spine personally reviewed: mild generalized facet spurring with no evidence of cord impingement.  Past NSAIDS:Naproxen, flurbiprofen Past steroids: prednisone Past analgesics:Stadol, tramadol, Tylenol, codeine, lorcet, percocet Past abortive triptans:Sumatriptan tablet, Maxalt, Relpax, Zomig, Amerge, Zembrace SymTouch Past abortive ergotamine:None Past muscle relaxants:none Past anti-emetic:Phenergan, Zofran, Reglan Past antihypertensive medications:propranolol Past antidepressant medications:Imipramine, Zoloft, Wellbutrin, Cymbalta, Lexapro, Effexor Past anticonvulsant medications:Topiramate, gabapentin Past anti-CGRP:Ubrelvy (ineffective, caused paresthesias); Nurtec, Emgality Past vitamins/Herbal/Supplements:none Past antihistamines/decongestants:Dramamine, benadryl Other past therapies:none   Family history of headache:Mom, sister Other family history: Maternal GM (strokes)  PAST MEDICAL HISTORY: Past Medical History:  Diagnosis Date  . Depression   . Hashimoto's disease   . Kidney stones   . Migraine     MEDICATIONS: Current Outpatient Medications on File Prior to Visit  Medication Sig Dispense Refill  . Acetylcysteine 600 MG CAPS Take 600 mg by mouth 2 (two) times daily.     05/16/11 b complex vitamins tablet Take 1 tablet by mouth daily.    . Coenzyme Q10 (CO Q 10) 100 MG CAPS Take 200 mg by mouth daily.    05/16/11 Calcium (STOOL SOFTENER PO) Take by mouth 3 (three) times daily.    . Galcanezumab-gnlm (EMGALITY) 120 MG/ML SOAJ Inject 120 mg into the skin every 30 (thirty) days. 1 pen 5  . lamoTRIgine (LAMICTAL) 150 MG tablet Take 1 tablet (150 mg total) by mouth 2 (two) times daily. 180 tablet 1  . levothyroxine (SYNTHROID, LEVOTHROID) 125 MCG tablet Take 125 mcg by mouth daily before breakfast.     . lithium carbonate (LITHOBID) 300 MG CR tablet Take 3 tablets (900 mg total) by mouth at bedtime. 270 tablet 0  . Magnesium 200 MG TABS Take by mouth daily.    01/14/18  Multiple Vitamins-Minerals (WOMENS MULTIVITAMIN PLUS PO) Take 1 tablet by mouth daily.     Marland Kitchen PARoxetine (PAXIL) 30 MG tablet TAKE ONE TABLET BY MOUTH DAILY 90 tablet 0  . Polyethylene Glycol 3350 (MIRALAX PO) Take by mouth daily.    . QUEtiapine (SEROQUEL) 100 MG tablet Take 1 tablet (100 mg total) by mouth at bedtime. 90 tablet 1  . SUMAtriptan Succinate (ZEMBRACE SYMTOUCH) 3 MG/0.5ML SOAJ Inject 3 mg into the skin as needed (May repeat in 1 hour.  Maximum 2 shots in 24 hours). 6 pen 11  . verapamil (VERELAN PM) 180 MG 24 hr capsule Take 180 mg by mouth at bedtime.     No current facility-administered medications on file prior to visit.    ALLERGIES: Allergies  Allergen Reactions  . Perphenazine Anaphylaxis and Swelling    Throat swelling and facial muscle contractions    . Imipramine Other (See Comments)    Can't remember adverse effect  . Penicillins Rash  . Albuterol   . Other     Phenegran  . Codeine Rash    FAMILY HISTORY: Family History  Problem Relation Age of Onset  . Depression Mother   . Migraines Mother   . Obesity Mother   . Rheum arthritis Sister   . Migraines Sister   . Hypertension Sister     SOCIAL HISTORY: Social History   Socioeconomic History  . Marital status:  Married    Spouse name: Acupuncturist  . Number of children: 4  . Years of education: 30  . Highest education level: Some college, no degree  Occupational History  . 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 Smoking status: Never Smoker  . Smokeless tobacco: Never Used  Vaping Use  . Vaping Use: Never used  Substance and Sexual Activity  . Alcohol use: No  . Drug use: No  . Sexual activity: Yes    Partners: Male    Birth control/protection: Other-see comments    Comment: husband-vasectomy  Other Topics Concern  . Not on file  Social History Narrative   Patient is right-handed. She lives with her husband and 4 children in a one level home. She drinks one glass of tea (8-16 oz) a day. She does not exercise. Some college.   Social Determinants of Health   Financial Resource Strain: Not on file  Food Insecurity: Not on file  Transportation Needs: Not on file  Physical Activity: Not on file  Stress: Not on file  Social Connections: Not on file  Intimate Partner Violence: Not on file     Objective:  Blood pressure 119/76, pulse 88, resp. rate 18, height 5\' 3"  (1.6 m), weight 146 lb (66.2 kg), SpO2 100 %. General: No acute distress.  Patient appears well-groomed.     Assessment/Plan:   1.  Migraine without aura, without status migrainosus, not intractable 2.  Major depressive disorder  1.  Migraine prevention:  Restart Emgality 2.  Migraine rescue:  Will prescribe Tosymra NS 3.  Limit use of pain relievers to no more than 2 days out of week to prevent risk of rebound or medication-overuse headache. 4.  Keep headache diary 5.  Follow up 6 months.  , DO  CC: C. Shon Millet, MD

## 2020-09-05 NOTE — Patient Instructions (Signed)
1. Restart emgality 2.  For rescue therapy, Tosymra.  Take as directed.  Expect a text from [facility] - where you previously got the shot.

## 2020-09-10 ENCOUNTER — Other Ambulatory Visit: Payer: Self-pay

## 2020-09-10 ENCOUNTER — Encounter (HOSPITAL_COMMUNITY): Payer: Self-pay | Admitting: Emergency Medicine

## 2020-09-10 ENCOUNTER — Emergency Department (HOSPITAL_COMMUNITY)
Admission: EM | Admit: 2020-09-10 | Discharge: 2020-09-10 | Disposition: A | Discharge status: Home / Self Care | Payer: BC Managed Care – PPO | Attending: Emergency Medicine | Admitting: Emergency Medicine

## 2020-09-10 ENCOUNTER — Emergency Department (HOSPITAL_COMMUNITY): Payer: BC Managed Care – PPO

## 2020-09-10 ENCOUNTER — Ambulatory Visit (HOSPITAL_COMMUNITY)
Admission: EM | Admit: 2020-09-10 | Discharge: 2020-09-10 | Disposition: A | Discharge status: Home / Self Care | Payer: BC Managed Care – PPO

## 2020-09-10 DIAGNOSIS — R109 Unspecified abdominal pain: Secondary | ICD-10-CM

## 2020-09-10 DIAGNOSIS — Z87442 Personal history of urinary calculi: Secondary | ICD-10-CM | POA: Insufficient documentation

## 2020-09-10 DIAGNOSIS — R112 Nausea with vomiting, unspecified: Secondary | ICD-10-CM | POA: Diagnosis not present

## 2020-09-10 DIAGNOSIS — R1013 Epigastric pain: Secondary | ICD-10-CM | POA: Insufficient documentation

## 2020-09-10 DIAGNOSIS — R1110 Vomiting, unspecified: Secondary | ICD-10-CM | POA: Diagnosis not present

## 2020-09-10 DIAGNOSIS — R1010 Upper abdominal pain, unspecified: Secondary | ICD-10-CM | POA: Diagnosis not present

## 2020-09-10 LAB — CBC
HCT: 42.2 % (ref 36.0–46.0)
Hemoglobin: 13.8 g/dL (ref 12.0–15.0)
MCH: 27.9 pg (ref 26.0–34.0)
MCHC: 32.7 g/dL (ref 30.0–36.0)
MCV: 85.4 fL (ref 80.0–100.0)
Platelets: 268 10*3/uL (ref 150–400)
RBC: 4.94 MIL/uL (ref 3.87–5.11)
RDW: 14 % (ref 11.5–15.5)
WBC: 9.9 10*3/uL (ref 4.0–10.5)
nRBC: 0 % (ref 0.0–0.2)

## 2020-09-10 LAB — COMPREHENSIVE METABOLIC PANEL
ALT: 17 U/L (ref 0–44)
AST: 20 U/L (ref 15–41)
Albumin: 4 g/dL (ref 3.5–5.0)
Alkaline Phosphatase: 89 U/L (ref 38–126)
Anion gap: 10 (ref 5–15)
BUN: 20 mg/dL (ref 6–20)
CO2: 19 mmol/L — ABNORMAL LOW (ref 22–32)
Calcium: 10.4 mg/dL — ABNORMAL HIGH (ref 8.9–10.3)
Chloride: 108 mmol/L (ref 98–111)
Creatinine, Ser: 0.92 mg/dL (ref 0.44–1.00)
GFR, Estimated: >60 mL/min (ref >60)
Glucose, Bld: 113 mg/dL — ABNORMAL HIGH (ref 70–99)
Potassium: 3.7 mmol/L (ref 3.5–5.1)
Sodium: 137 mmol/L (ref 135–145)
Total Bilirubin: 0.6 mg/dL (ref 0.3–1.2)
Total Protein: 7.2 g/dL (ref 6.5–8.1)

## 2020-09-10 LAB — URINALYSIS, ROUTINE W REFLEX MICROSCOPIC
Bilirubin Urine: NEGATIVE
Glucose, UA: NEGATIVE mg/dL
Ketones, ur: NEGATIVE mg/dL
Leukocytes,Ua: NEGATIVE
Nitrite: NEGATIVE
Protein, ur: NEGATIVE mg/dL
Specific Gravity, Urine: 1.015 (ref 1.005–1.030)
pH: 7 (ref 5.0–8.0)

## 2020-09-10 LAB — LIPASE, BLOOD: Lipase: 47 U/L (ref 11–51)

## 2020-09-10 LAB — URINALYSIS, MICROSCOPIC (REFLEX)
Bacteria, UA: NONE SEEN
RBC / HPF: >50 RBC/hpf (ref 0–5)

## 2020-09-10 LAB — TROPONIN I (HIGH SENSITIVITY)
Troponin I (High Sensitivity): 5 ng/L (ref <18)
Troponin I (High Sensitivity): 6 ng/L (ref <18)

## 2020-09-10 LAB — I-STAT BETA HCG BLOOD, ED (MC, WL, AP ONLY): I-stat hCG, quantitative: <5 m[IU]/mL (ref <5)

## 2020-09-10 MED ORDER — ONDANSETRON HCL 4 MG/2ML IJ SOLN
4.0000 mg | Freq: Once | INTRAMUSCULAR | Status: AC
  Administered 2020-09-10: 4 mg via INTRAVENOUS
  Filled 2020-09-10: qty 2

## 2020-09-10 MED ORDER — LIDOCAINE VISCOUS HCL 2 % MT SOLN
15.0000 mL | Freq: Once | OROMUCOSAL | Status: AC
  Administered 2020-09-10: 15 mL via ORAL
  Filled 2020-09-10: qty 15

## 2020-09-10 MED ORDER — IOHEXOL 300 MG/ML  SOLN
100.0000 mL | Freq: Once | INTRAMUSCULAR | Status: AC | PRN
  Administered 2020-09-10: 100 mL via INTRAVENOUS

## 2020-09-10 MED ORDER — OMEPRAZOLE 20 MG PO CPDR: 20.0000 mg | DELAYED_RELEASE_CAPSULE | Freq: Every day | ORAL | 0 refills | Status: DC

## 2020-09-10 MED ORDER — SUCRALFATE 1 G PO TABS: 1.0000 g | ORAL_TABLET | Freq: Three times a day (TID) | ORAL | 0 refills | Status: DC

## 2020-09-10 MED ORDER — ALUM & MAG HYDROXIDE-SIMETH 200-200-20 MG/5ML PO SUSP
30.0000 mL | Freq: Once | ORAL | Status: AC
  Administered 2020-09-10: 30 mL via ORAL
  Filled 2020-09-10: qty 30

## 2020-09-10 MED ORDER — MORPHINE SULFATE (PF) 2 MG/ML IV SOLN
2.0000 mg | Freq: Once | INTRAVENOUS | Status: AC
  Administered 2020-09-10: 2 mg via INTRAVENOUS
  Filled 2020-09-10: qty 1

## 2020-09-10 NOTE — ED Notes (Signed)
Patient verbalized understanding of discharge instructions. Opportunity for questions and answers.  

## 2020-09-10 NOTE — ED Triage Notes (Signed)
Patient is being discharged from the Urgent Care and sent to the Emergency Department via POV. Per provider, patient is in need of higher level of care due to severe abdominal pain and nausea. Patient is aware and verbalizes understanding of plan of care. There were no vitals filed for this visit.

## 2020-09-10 NOTE — ED Notes (Signed)
Patient transported to X-ray 

## 2020-09-10 NOTE — ED Provider Notes (Signed)
[facility] EMERGENCY DEPARTMENT Provider Note   CSN: [id number] Arrival date & time: 09/10/20  1750     History Chief Complaint  Patient presents with  . Abdominal Pain    [name] is a 50 y.o. female.  HPI Patient is a 50 year old female with a medical history as noted below.  She presents the emergency department due to upper abdominal pain that started this morning.  It does not radiate.  She states that after began she took Tums which alleviated her symptoms.  Then around lunchtime her pain represented and worsened.  She took Tums as well as famotidine without any significant relief this afternoon.  She reports associated nausea.  She states her pain is constant and then well intermittently worsen.  No chest pain.  She notes that she takes short breaths due to the pain but denies any shortness of breath.  No vomiting, diarrhea, urinary complaints.  No history of surgery to the abdomen.    Past Medical History:  Diagnosis Date  . Depression   . Hashimoto's disease   . Kidney stones   . Migraine     Patient Active Problem List   Diagnosis Date Noted  . MDD (major depressive disorder) 08/24/2013  . GAD (generalized anxiety disorder) 08/24/2013  . Suicide ideation 08/24/2013    Past Surgical History:  Procedure Laterality Date  . WISDOM TOOTH EXTRACTION       OB History   No obstetric history on file.     Family History  Problem Relation Age of Onset  . Depression Mother   . Migraines Mother   . Obesity Mother   . Rheum arthritis Sister   . Migraines Sister   . Hypertension Sister     Social History   Tobacco Use  . Smoking status: Never Smoker  . Smokeless tobacco: Never Used  Vaping Use  . Vaping Use: Never used  Substance Use Topics  . Alcohol use: No  . Drug use: No    Home Medications Prior to Admission medications   Medication Sig Start Date End Date Taking? Authorizing Provider  omeprazole (PRILOSEC) 20 MG  capsule Take 1 capsule (20 mg total) by mouth daily. 09/10/20  Yes Placido Sou, PA-C  sucralfate (CARAFATE) 1 g tablet Take 1 tablet (1 g total) by mouth 4 (four) times daily -  with meals and at bedtime for 14 days. 09/10/20 09/24/20 Yes Placido Sou, PA-C  Acetylcysteine 600 MG CAPS Take 600 mg by mouth 2 (two) times daily. Patient not taking: Reported on 09/05/2020    [provider]  b complex vitamins tablet Take 1 tablet by mouth daily.    [provider]  Coenzyme Q10 (CO Q 10) 100 MG CAPS Take 200 mg by mouth daily.    [provider]  Docusate Calcium (STOOL SOFTENER PO) Take by mouth 3 (three) times daily.    [provider]  Galcanezumab-gnlm (EMGALITY) 120 MG/ML SOAJ Inject 120 mg into the skin every 28 (twenty-eight) days. 09/05/20   Drema Dallas, DO  lamoTRIgine (LAMICTAL) 150 MG tablet Take 1 tablet (150 mg total) by mouth 2 (two) times daily. 01/20/20   Cottle, Steva Ready., MD  levothyroxine (SYNTHROID, LEVOTHROID) 125 MCG tablet Take 125 mcg by mouth daily before breakfast.     [provider]  lithium carbonate (LITHOBID) 300 MG CR tablet Take 3 tablets (900 mg total) by mouth at bedtime. 08/22/20   Cottle, Steva Ready., MD  Magnesium 200 MG TABS Take by mouth daily.    [provider]  Multiple Vitamins-Minerals (WOMENS MULTIVITAMIN PLUS PO) Take 1 tablet by mouth daily.     [provider]  PARoxetine (PAXIL) 30 MG tablet TAKE ONE TABLET BY MOUTH DAILY 07/18/20   Cottle, Steva Ready., MD  Polyethylene Glycol 3350 (MIRALAX PO) Take by mouth daily.    [provider]  QUEtiapine (SEROQUEL) 100 MG tablet Take 1 tablet (100 mg total) by mouth at bedtime. 01/20/20   Cottle, Steva Ready., MD  SUMAtriptan (TOSYMRA) 10 MG/ACT SOLN Place 10 mg into the nose as needed (May repeat in 1 hour.  Maximum 3 sprays in 24 hours). 09/05/20   Everlena Cooper, Adam R, DO  verapamil (VERELAN PM) 180 MG 24 hr capsule Take 180 mg by mouth at  bedtime.    [provider]    Allergies    Perphenazine, Imipramine, Penicillins, Albuterol, Other, and Codeine  Review of Systems   Review of Systems  All other systems reviewed and are negative. Ten systems reviewed and are negative for acute change, except as noted in the HPI.   Physical Exam Updated Vital Signs BP (!) 155/90   Pulse 75   Temp 98.9 F (37.2 C) (Oral)   Resp 11   LMP 09/10/2020   SpO2 99%   Physical Exam Vitals and nursing note reviewed.  Constitutional:      General: She is not in acute distress.    Appearance: Normal appearance. She is not ill-appearing, toxic-appearing or diaphoretic.  HENT:     Head: Normocephalic and atraumatic.     Right Ear: External ear normal.     Left Ear: External ear normal.     Nose: Nose normal.     Mouth/Throat:     Mouth: Mucous membranes are moist.     Pharynx: Oropharynx is clear. No oropharyngeal exudate or posterior oropharyngeal erythema.  Eyes:     Extraocular Movements: Extraocular movements intact.  Cardiovascular:     Rate and Rhythm: Normal rate and regular rhythm.     Pulses: Normal pulses.     Heart sounds: Normal heart sounds. No murmur heard. No friction rub. No gallop.   Pulmonary:     Effort: Pulmonary effort is normal. No respiratory distress.     Breath sounds: Normal breath sounds. No stridor. No wheezing, rhonchi or rales.  Abdominal:     General: Abdomen is flat.     Palpations: Abdomen is soft.     Tenderness: There is abdominal tenderness in the right upper quadrant, epigastric area and left lower quadrant. There is no guarding or rebound. Positive signs include Murphy's sign. Negative signs include McBurney's sign.     Comments: Abdomen is flat and soft.  Mild tenderness noted in the left lower quadrant.  Exquisite tenderness noted along the epigastric region and right upper quadrant. No flank pain.  Musculoskeletal:        General: Normal range of motion.     Cervical back: Normal  range of motion and neck supple. No tenderness.  Skin:    General: Skin is warm and dry.  Neurological:     General: No focal deficit present.     Mental Status: She is alert and oriented to person, place, and time.  Psychiatric:        Mood and Affect: Mood normal.        Behavior: Behavior normal.    ED Results / Procedures / Treatments  Labs (all labs ordered are listed, but only abnormal results are displayed) Labs Reviewed  COMPREHENSIVE METABOLIC PANEL - Abnormal; Notable for the following components:      Result Value   CO2 19 (*)    Glucose, Bld 113 (*)    Calcium 10.4 (*)    All other components within normal limits  URINALYSIS, ROUTINE W REFLEX MICROSCOPIC - Abnormal; Notable for the following components:   Color, Urine RED (*)    APPearance CLOUDY (*)    Hgb urine dipstick LARGE (*)    All other components within normal limits  LIPASE, BLOOD  CBC  URINALYSIS, MICROSCOPIC (REFLEX)  I-STAT BETA HCG BLOOD, ED ([facility], [facility], [facility] ONLY)  TROPONIN I (HIGH SENSITIVITY)  TROPONIN I (HIGH SENSITIVITY)   EKG EKG Interpretation  Date/Time:  Sunday September 10 2020 18:40:46 EST Ventricular Rate:  70 PR Interval:  144 QRS Duration: 92 QT Interval:  410 QTC Calculation: 442 R Axis:   85 Text Interpretation: Normal sinus rhythm with sinus arrhythmia Normal ECG No old tracing to compare Confirmed by Linwood DibblesKnapp, Jon [phone]([id number]) on 09/10/2020 9:37:30 PM  Radiology CT ABDOMEN PELVIS W CONTRAST  Result Date: 09/10/2020 CLINICAL DATA:  Pt reports upper abd pain/epigastric pain with nausea that started this morning and worse since 1pm. Denies vomiting and diarrhea. Also reports SOB. EXAM: CT ABDOMEN AND PELVIS WITH CONTRAST TECHNIQUE: Multidetector CT imaging of the abdomen and pelvis was performed using the standard protocol following bolus administration of intravenous contrast. CONTRAST:  100mL OMNIPAQUE IOHEXOL 300 MG/ML  SOLN COMPARISON:  01/17/2011 FINDINGS: Lower chest: Clear lung bases.  Hepatobiliary: No focal liver abnormality is seen. No gallstones, gallbladder wall thickening, or biliary dilatation. Pancreas: Unremarkable. No pancreatic ductal dilatation or surrounding inflammatory changes. Spleen: Normal in size without focal abnormality. Adrenals/Urinary Tract: No adrenal masses. Kidneys normal in size, orientation and position. Multiple bilateral nonobstructing intrarenal stones. No renal masses. No hydronephrosis. Ureters normal in course and in caliber. No ureteral stones. Normal bladder. Stomach/Bowel: Stomach is within normal limits. Appendix appears normal. No evidence of bowel wall thickening, distention, or inflammatory changes. Vascular/Lymphatic: No significant vascular findings are present. No enlarged abdominal or pelvic lymph nodes. Reproductive: Uterus and bilateral adnexa are unremarkable. Other: No abdominal wall hernia or abnormality. No abdominopelvic ascites. Musculoskeletal: No acute or significant osseous findings. IMPRESSION: 1. No acute findings. No findings to account for the patient's abdominal/epigastric pain. 2. Multiple bilateral nonobstructing intrarenal stones. No ureteral stones or obstructive uropathy. No other abnormalities. Electronically Signed   By: Amie Portlandavid  Ormond M.D.   On: 09/10/2020 20:49    Procedures Procedures   Medications Ordered in ED Medications  morphine 2 MG/ML injection 2 mg (2 mg Intravenous Given 09/10/20 2016)  alum & mag hydroxide-simeth (MAALOX/MYLANTA) 200-200-20 MG/5ML suspension 30 mL (30 mLs Oral Given 09/10/20 1942)    And  lidocaine (XYLOCAINE) 2 % viscous mouth solution 15 mL (15 mLs Oral Given 09/10/20 1943)  ondansetron (ZOFRAN) injection 4 mg (4 mg Intravenous Given 09/10/20 2018)  iohexol (OMNIPAQUE) 300 MG/ML solution 100 mL (100 mLs Intravenous Contrast Given 09/10/20 2041)   ED Course  I have reviewed the triage vital signs and the nursing notes.  Pertinent labs & imaging results that were available during my care  of the patient were reviewed by me and considered in my medical decision making (see chart for details).  Clinical Course as of 09/10/20 2135  Wynelle LinkSun Sep 10, 2020  1918 Patient came nauseated and vomited her GI cocktail.  Patient given a dose of Zofran. [LJ]  2059 CT ABDOMEN PELVIS W CONTRAST IMPRESSION: 1. No acute findings. No findings to account for the patient's abdominal/epigastric pain. 2. Multiple bilateral nonobstructing intrarenal stones. No ureteral stones or obstructive uropathy. No other abnormalities. [LJ]  2100 RBC / HPF: >50 Patient currently menstruating. [LJ]  2100 Hgb urine dipstick(!): LARGE [LJ]  2100 Color, Urine(!): RED [LJ]    Clinical Course User Index [LJ] Placido Sou, PA-C   MDM Rules/Calculators/A&P                          Pt is a 50 y.o. female who presents today due to upper abdominal pain as well as nausea that started this morning.  Labs: CBC with no abnormalities. Lipase not elevated at 47. I-STAT beta-hCG less than five. UA is red, cloudy, large hemoglobin, greater than 50 RBCs.  Patient states that she is currently menstruating and had difficulty getting a urine sample without blood. CMP with a CO2 of 19, glucose 113, calcium of 10.4.  Otherwise, no abnormalities. Troponin of six.  Imaging: CT scan of the abdomen and pelvis shows no acute findings.  No findings to account for her pain.  There are multiple bilateral nonobstructing intrarenal stones.  No ureteral stones or obstructive uropathy.  I, Placido Sou, PA-C, personally reviewed and evaluated these images and lab results as part of my medical decision-making.  Unsure of the cause of the patient's pain.  She appears to be feeling better after getting Zofran as well as morphine.  Feel that this could likely be secondary to gastritis given her significant epigastric pain.  Patient has Pepcid at home.  Discussed starting omeprazole once per day along with her Pepcid, and also a short  course of Carafate.  We discussed dietary changes for gastritis.  Also discussed possible GI follow-up if her symptoms do not improve.  We will give patient a referral.  Discussed return precautions at length.  Patient feels comfortable being discharged at the time.  Feel the patient is stable for discharge at this time.  Her questions were answered and she was amicable at the time of discharge.  Note: Portions of this report may have been transcribed using voice recognition software. Every effort was made to ensure accuracy; however, inadvertent computerized transcription errors may be present.   Final Clinical Impression(s) / ED Diagnoses Final diagnoses:  Epigastric pain    Rx / DC Orders ED Discharge Orders         Ordered    omeprazole (PRILOSEC) 20 MG capsule  Daily        09/10/20 2131    sucralfate (CARAFATE) 1 g tablet  3 times daily with meals & bedtime        09/10/20 2131           Placido Sou, PA-C 09/10/20 2137    Linwood Dibbles, MD 09/11/20 [phone]

## 2020-09-10 NOTE — Discharge Instructions (Addendum)
Like we discussed, you can continue to take famotidine once per day.  I would recommend you take it in the evening.  I am also going to give you a prescription for omeprazole.  You can begin taking this once per day, preferably in the mornings.  Try to space this medication out from your regular medications.  This along with famotidine will help reduce stomach acid production and hopefully help resolve some of your symptoms.  I am also going to prescribe you a medication called Carafate the you can take for the next couple of weeks.  This is a medication that will help coat your stomach lining and hopefully, again, help reduce your symptoms.  If your symptoms persist you can always follow-up with gastroenterology.  I have given you a referral below.  If your symptoms worsen, feel free to return to the emergency department for reevaluation.  It was a pleasure to meet you.

## 2020-09-10 NOTE — ED Triage Notes (Signed)
Pt reports upper abd pain/epigastric pain with nausea that started this morning and worse since 1pm.  Denies vomiting and diarrhea.  Also reports SOB.  Sent from [facility].

## 2020-09-25 ENCOUNTER — Encounter: Payer: Self-pay | Admitting: Neurology

## 2020-09-25 NOTE — Progress Notes (Signed)
Per [employer] second level appeal was denied for the [employer]. REF #: [id number].  Patient has to try and fail all alternatives which include all triptans and their generics.  Sent denial letter to scanning for the patient's chart.

## 2020-10-02 ENCOUNTER — Other Ambulatory Visit: Payer: Self-pay | Admitting: Psychiatry

## 2020-10-02 DIAGNOSIS — F331 Major depressive disorder, recurrent, moderate: Secondary | ICD-10-CM

## 2020-10-02 DIAGNOSIS — F5105 Insomnia due to other mental disorder: Secondary | ICD-10-CM

## 2020-10-11 ENCOUNTER — Ambulatory Visit (INDEPENDENT_AMBULATORY_CARE_PROVIDER_SITE_OTHER): Payer: BC Managed Care – PPO | Admitting: Psychiatry

## 2020-10-11 ENCOUNTER — Other Ambulatory Visit: Payer: Self-pay

## 2020-10-11 ENCOUNTER — Encounter: Payer: Self-pay | Admitting: Psychiatry

## 2020-10-11 DIAGNOSIS — R7989 Other specified abnormal findings of blood chemistry: Secondary | ICD-10-CM

## 2020-10-11 DIAGNOSIS — F331 Major depressive disorder, recurrent, moderate: Secondary | ICD-10-CM | POA: Diagnosis not present

## 2020-10-11 DIAGNOSIS — F900 Attention-deficit hyperactivity disorder, predominantly inattentive type: Secondary | ICD-10-CM

## 2020-10-11 DIAGNOSIS — F4010 Social phobia, unspecified: Secondary | ICD-10-CM

## 2020-10-11 DIAGNOSIS — R413 Other amnesia: Secondary | ICD-10-CM

## 2020-10-11 DIAGNOSIS — Z79899 Other long term (current) drug therapy: Secondary | ICD-10-CM

## 2020-10-11 DIAGNOSIS — F5105 Insomnia due to other mental disorder: Secondary | ICD-10-CM

## 2020-10-11 DIAGNOSIS — F4001 Agoraphobia with panic disorder: Secondary | ICD-10-CM

## 2020-10-11 DIAGNOSIS — F411 Generalized anxiety disorder: Secondary | ICD-10-CM | POA: Diagnosis not present

## 2020-10-11 MED ORDER — LAMOTRIGINE 150 MG PO TABS: 150.0000 mg | ORAL_TABLET | Freq: Two times a day (BID) | ORAL | 0 refills | Status: DC

## 2020-10-11 MED ORDER — LITHIUM CARBONATE ER 300 MG PO TBCR: 900.0000 mg | EXTENDED_RELEASE_TABLET | Freq: Every day | ORAL | 0 refills | Status: DC

## 2020-10-11 MED ORDER — PAROXETINE HCL 30 MG PO TABS: 30.0000 mg | ORAL_TABLET | Freq: Every day | ORAL | 1 refills | Status: DC

## 2020-10-11 NOTE — Progress Notes (Signed)
Lisa SierrasMelissa L Pechacek [id number] October 06, 1970 50 y.o.  Subjective:   Patient ID:  [name] is a 50 y.o. (DOB October 06, 1970) female.  Chief Complaint:  Chief Complaint  Patient presents with  . Follow-up  . Major depressive disorder, recurrent episode, moderate (HCC)    Daylin L Macqueen is followed up for severe treatment resistant depression with a history of suicide attempts plus chronic anxiety disorders.  At appt Sep 02, 2018. Raised Concerta to 27 mg but then she had anxiety and couple panic and so it was reduced to 18 mg and anxiety resolved.  Last appointment May 2020.  No meds were changed.  November 2020 appointment with the following noted: Out of the stimulant.  Didn't pursue it as not critical..  Overall OK considering social matters. Concerned about her memory.  Loses track in conversation.  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 Overall pretty good mood.  Still tired in the am but less so with IR Seroquel lower dose..  Some anxiety with Covid.  Wasn't following thoughts to dark and scary places.  Loves being at home so ok. Getting things done and that feels really good.  Put off home schooling until July.  Better mood. Bc tremor she reduced lithium from 1200 to 900 on April 15 and the tremor is better not gone and feels the same.  No SI since here. Still has to nap and rest at times with Concerta but it seemed to help, but hard to explain.  Clarity better.  Easier to make decisions.  Don't feel as daft.  Still hard listening to sermons, drift off all my life.  Is doing better with home schooling.  Still night eating.  Sleep 8-10 hours. Plan restart Concerta and check labs for cognition.  01/20/2020 appointment with the following noted: Patient should have been seen since November before this time. Did not do well with Concerta 18  bc too anxious. Perimenopausal.  Moody.  Some irritability. Struggling a lot with migraine but seems to be getting better.  Gets emotionally  overwhelmed but not real dark moods.  Some sadness. Productivity is better lately. 1 panic since here and thought was having MI.  Anxiety is better now.  Tax season hard. Sleep great with routine.  Fidgety all the time is somewhat embarrassing.  No discomfort. Started diet and less nighteating now over the last couple of weeks.  Plan: For memory complaints check B12 and folate and lamotrigine level.  Also need to check lithium and BMP.  PCP checks thyroid.  10/11/20 appt noted: Pretty good.  Depression under control.  Anxiety is OK unless too much caffeine.   Patient reports stable mood and denies depressed or irritable moods.  Patient denies any recent difficulty with anxiety.  Patient denies difficulty with sleep initiation or maintenance. Denies appetite disturbance.  Patient reports that energy and motivation have been good.  Patient denies any difficulty with concentration.  Patient denies any suicidal ideation. Has a little tremor.   Saw benefit from NAC for cognition but ran out bc difficulty getting it. Still really hungry at night after Seroquel.  Asks about reduction.  Past Psychiatric Medication Trials:  Concerta 18 SE, modafinil 200 side effect, Ritalin, Loxitane 60, lamotrigine 300,  lithium, Seroquel XR 600, Abilify, Rexulti side effects, Depakote side effects, risperidone buspirone 30 twice daily, propranolol, gabapentin, topiramate, , imipramine, venlafaxine, sertraline, Lexapro 20 nr, Wellbutrin,  selegiline, duloxetine side effects, temazepam Deplin,   Relpax depression  Review of Systems:  Negative  for weakness, + weight gain, trmor, connstipation , reflux, some HA, hearing is getting worse. No CP  Medications: I have reviewed the patient's current medications.  Current Outpatient Medications  Medication Sig Dispense Refill  . b complex vitamins tablet Take 1 tablet by mouth daily.    Tery Sanfilippo Calcium (STOOL SOFTENER PO) Take by mouth 3 (three) times daily.    .  Galcanezumab-gnlm (EMGALITY) 120 MG/ML SOAJ Inject 120 mg into the skin every 28 (twenty-eight) days. 1.12 mL 5  . levothyroxine (SYNTHROID, LEVOTHROID) 125 MCG tablet Take 125 mcg by mouth daily before breakfast.     . Magnesium 200 MG TABS Take by mouth daily.    . Multiple Vitamins-Minerals (WOMENS MULTIVITAMIN PLUS PO) Take 1 tablet by mouth daily.     . Polyethylene Glycol 3350 (MIRALAX PO) Take by mouth daily.    . QUEtiapine (SEROQUEL) 100 MG tablet TAKE ONE TABLET BY MOUTH AT BEDTIME 90 tablet 0  . SUMAtriptan (TOSYMRA) 10 MG/ACT SOLN Place 10 mg into the nose as needed (May repeat in 1 hour.  Maximum 3 sprays in 24 hours). 6 each 5  . verapamil (CALAN-SR) 240 MG CR tablet Take 240 mg by mouth daily.    . Acetylcysteine 600 MG CAPS Take 600 mg by mouth 2 (two) times daily. (Patient not taking: No sig reported)    . Coenzyme Q10 (CO Q 10) 100 MG CAPS Take 200 mg by mouth daily. (Patient not taking: Reported on 10/11/2020)    . lamoTRIgine (LAMICTAL) 150 MG tablet Take 1 tablet (150 mg total) by mouth 2 (two) times daily. 180 tablet 0  . lithium carbonate (LITHOBID) 300 MG CR tablet Take 3 tablets (900 mg total) by mouth at bedtime. 270 tablet 0  . omeprazole (PRILOSEC) 20 MG capsule Take 1 capsule (20 mg total) by mouth daily. (Patient not taking: Reported on 10/11/2020) 30 capsule 0  . PARoxetine (PAXIL) 30 MG tablet Take 1 tablet (30 mg total) by mouth daily. 90 tablet 1  . sucralfate (CARAFATE) 1 g tablet Take 1 tablet (1 g total) by mouth 4 (four) times daily -  with meals and at bedtime for 14 days. 56 tablet 0   No current facility-administered medications for this visit.    Medication Side Effects: Other: night eating may be related., tremor  Allergies:  Allergies  Allergen Reactions  . Perphenazine Anaphylaxis and Swelling    Throat swelling and facial muscle contractions    . Imipramine Other (See Comments)    Can't remember adverse effect  . Penicillins Rash  . Albuterol    . Other     Phenegran  . Codeine Rash    Past Medical History:  Diagnosis Date  . Depression   . Hashimoto's disease   . Kidney stones   . Migraine     Family History  Problem Relation Age of Onset  . Depression Mother   . Migraines Mother   . Obesity Mother   . Rheum arthritis Sister   . Migraines Sister   . Hypertension Sister     Social History   Socioeconomic History  . Marital status: Married    Spouse name: Acupuncturist  . Number of children: 4  . Years of education: 20  . Highest education level: Some college, no degree  Occupational History  . 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 Smoking status: Never Smoker  . Smokeless tobacco: Never Used  Vaping  Use  . Vaping Use: Never used  Substance and Sexual Activity  . Alcohol use: No  . Drug use: No  . Sexual activity: Yes    Partners: Male    Birth control/protection: Other-see comments    Comment: husband-vasectomy  Other Topics Concern  . Not on file  Social History Narrative   Patient is right-handed. She lives with her husband and 4 children in a one level home. She drinks one glass of tea (8-16 oz) a day. She does not exercise. Some college.   Social Determinants of Health   Financial Resource Strain: Not on file  Food Insecurity: Not on file  Transportation Needs: Not on file  Physical Activity: Not on file  Stress: Not on file  Social Connections: Not on file  Intimate Partner Violence: Not on file    Past Medical History, Surgical history, Social history, and Family history were reviewed and updated as appropriate.   Please see review of systems for further details on the patient's review from today.   Objective:   Physical Exam:  There were no vitals taken for this visit.  Physical Exam Constitutional:      General: She is not in acute distress.    Appearance: She is well-developed.  Musculoskeletal:        General: No deformity.  Neurological:      Mental Status: She is alert and oriented to person, place, and time.     Motor: No tremor.     Coordination: Coordination normal.     Gait: Gait normal.  Psychiatric:        Attention and Perception: She is inattentive.        Mood and Affect: Mood is not anxious or depressed. Affect is not labile, blunt, angry or inappropriate.        Speech: Speech normal.        Behavior: Behavior normal.        Thought Content: Thought content normal. Thought content does not include homicidal or suicidal ideation. Thought content does not include homicidal or suicidal plan.        Cognition and Memory: Cognition normal.        Judgment: Judgment normal.     Comments: Insight and general self-awareness is moderately impaired chronically.     Lab Review:     Component Value Date/Time   NA 137 09/10/2020 1906   K 3.7 09/10/2020 1906   CL 108 09/10/2020 1906   CO2 19 (L) 09/10/2020 1906   GLUCOSE 113 (H) 09/10/2020 1906   BUN 20 09/10/2020 1906   CREATININE 0.92 09/10/2020 1906   CREATININE 0.85 05/10/2020 0940   CALCIUM 10.4 (H) 09/10/2020 1906   PROT 7.2 09/10/2020 1906   ALBUMIN 4.0 09/10/2020 1906   AST 20 09/10/2020 1906   ALT 17 09/10/2020 1906   ALKPHOS 89 09/10/2020 1906   BILITOT 0.6 09/10/2020 1906   GFRNONAA >60 09/10/2020 1906   GFRAA >90 08/23/2013 1600       Component Value Date/Time   WBC 9.9 09/10/2020 1906   RBC 4.94 09/10/2020 1906   HGB 13.8 09/10/2020 1906   HCT 42.2 09/10/2020 1906   PLT 268 09/10/2020 1906   MCV 85.4 09/10/2020 1906   MCH 27.9 09/10/2020 1906   MCHC 32.7 09/10/2020 1906   RDW 14.0 09/10/2020 1906   LYMPHSABS 2.1 01/17/2011 1812   MONOABS 0.7 01/17/2011 1812   EOSABS 0.1 01/17/2011 1812   BASOSABS 0.0 01/17/2011 1812    Lithium  Lvl  Date Value Ref Range Status  05/10/2020 0.7 0.6 - 1.2 mmol/L Final     No results found for: PHENYTOIN, PHENOBARB, VALPROATE, CBMZ   .res Assessment: Plan:    Major depressive disorder, recurrent  episode, moderate (HCC) - Plan: lamoTRIgine (LAMICTAL) 150 MG tablet, lithium carbonate (LITHOBID) 300 MG CR tablet, PARoxetine (PAXIL) 30 MG tablet  Panic disorder with agoraphobia - Plan: PARoxetine (PAXIL) 30 MG tablet  Generalized anxiety disorder - Plan: PARoxetine (PAXIL) 30 MG tablet  Social anxiety disorder - Plan: PARoxetine (PAXIL) 30 MG tablet  Attention deficit hyperactivity disorder (ADHD), predominantly inattentive type  Insomnia due to mental condition  Lithium use  Low vitamin D level  Memory change   Hx severe TRD with Suicide attempt.  Insight and general self-awareness is moderately impaired chronically complicating and delaying treatment.  Also she has not kept appointments as frequently as recommended.  She is not having panic attacks and overall depression is reasonably managed though her motivation and energy are low.  She is somewhat chronically dysfunctional to some degree. Disc her questions about memory effects of meds.  Consider reduction of lamotrigine if laboratory tests do not show reasons for memory problems..  For memory complaints continue NAC 600 bc it helped. Disc availabilty problems.  Couldn't tolerate Concerta DT anxiety.  She still night eating which might possibly be related to quetiapine.  OK reduce to 50 mg HS .  May taper lower bc no mood effects that low. Discussed potential metabolic side effects associated with atypical antipsychotics, as well as potential risk for movement side effects. Advised pt to contact office if movement side effects occur.   Counseled patient regarding potential benefits, risks, and side effects of lithium to include potential risk of lithium affecting thyroid and renal function.  Discussed need for periodic lab monitoring to determine drug level and to assess for potential adverse effects.  Counseled patient regarding signs and symptoms of lithium toxicity and advised that they notify office immediately or seek  urgent medical attention if experiencing these signs and symptoms.  Patient advised to contact office with any questions or concerns. Lithium level good October.  Trial propranolol 20-40 mg prn tremor  Constipation managed  Option off label Aricept.  This appt was 30 mins.  FU 6 mos  Meredith Staggers, MD, DFAPA   Please see After Visit Summary for patient specific instructions.  Future Appointments  Date Time Provider Department Center  03/06/2021  3:30 PM Drema Dallas, DO [facility] None    No orders of the defined types were placed in this encounter.     -------------------------------

## 2020-10-15 ENCOUNTER — Other Ambulatory Visit: Payer: Self-pay | Admitting: Psychiatry

## 2020-10-15 DIAGNOSIS — F4010 Social phobia, unspecified: Secondary | ICD-10-CM

## 2020-10-15 DIAGNOSIS — F4001 Agoraphobia with panic disorder: Secondary | ICD-10-CM

## 2020-10-15 DIAGNOSIS — F411 Generalized anxiety disorder: Secondary | ICD-10-CM

## 2020-10-15 DIAGNOSIS — F331 Major depressive disorder, recurrent, moderate: Secondary | ICD-10-CM

## 2020-12-13 DIAGNOSIS — M25542 Pain in joints of left hand: Secondary | ICD-10-CM | POA: Diagnosis not present

## 2020-12-13 DIAGNOSIS — M79671 Pain in right foot: Secondary | ICD-10-CM | POA: Diagnosis not present

## 2020-12-13 DIAGNOSIS — M25572 Pain in left ankle and joints of left foot: Secondary | ICD-10-CM | POA: Diagnosis not present

## 2020-12-13 DIAGNOSIS — M25541 Pain in joints of right hand: Secondary | ICD-10-CM | POA: Diagnosis not present

## 2020-12-18 DIAGNOSIS — Z79899 Other long term (current) drug therapy: Secondary | ICD-10-CM | POA: Diagnosis not present

## 2020-12-18 DIAGNOSIS — G629 Polyneuropathy, unspecified: Secondary | ICD-10-CM | POA: Diagnosis not present

## 2020-12-18 DIAGNOSIS — E559 Vitamin D deficiency, unspecified: Secondary | ICD-10-CM | POA: Diagnosis not present

## 2020-12-18 DIAGNOSIS — E063 Autoimmune thyroiditis: Secondary | ICD-10-CM | POA: Diagnosis not present

## 2020-12-18 DIAGNOSIS — R7301 Impaired fasting glucose: Secondary | ICD-10-CM | POA: Diagnosis not present

## 2020-12-21 DIAGNOSIS — H0102B Squamous blepharitis left eye, upper and lower eyelids: Secondary | ICD-10-CM | POA: Diagnosis not present

## 2020-12-21 DIAGNOSIS — H0102A Squamous blepharitis right eye, upper and lower eyelids: Secondary | ICD-10-CM | POA: Diagnosis not present

## 2020-12-21 DIAGNOSIS — H04123 Dry eye syndrome of bilateral lacrimal glands: Secondary | ICD-10-CM | POA: Diagnosis not present

## 2020-12-21 DIAGNOSIS — H52202 Unspecified astigmatism, left eye: Secondary | ICD-10-CM | POA: Diagnosis not present

## 2020-12-21 DIAGNOSIS — H5201 Hypermetropia, right eye: Secondary | ICD-10-CM | POA: Diagnosis not present

## 2020-12-21 DIAGNOSIS — H524 Presbyopia: Secondary | ICD-10-CM | POA: Diagnosis not present

## 2020-12-21 DIAGNOSIS — H5212 Myopia, left eye: Secondary | ICD-10-CM | POA: Diagnosis not present

## 2021-01-01 ENCOUNTER — Other Ambulatory Visit: Payer: Self-pay | Admitting: Psychiatry

## 2021-01-01 DIAGNOSIS — F331 Major depressive disorder, recurrent, moderate: Secondary | ICD-10-CM

## 2021-01-01 DIAGNOSIS — F5105 Insomnia due to other mental disorder: Secondary | ICD-10-CM

## 2021-01-15 ENCOUNTER — Telehealth: Payer: Self-pay | Admitting: Neurology

## 2021-01-15 NOTE — Telephone Encounter (Signed)
Pt states that she needs a prior auth for the emgality medication   She uses the [facility] on [address]

## 2021-01-17 ENCOUNTER — Telehealth: Payer: Self-pay | Admitting: Neurology

## 2021-01-17 DIAGNOSIS — M5417 Radiculopathy, lumbosacral region: Secondary | ICD-10-CM | POA: Diagnosis not present

## 2021-01-17 DIAGNOSIS — M50323 Other cervical disc degeneration at C6-C7 level: Secondary | ICD-10-CM | POA: Diagnosis not present

## 2021-01-17 DIAGNOSIS — M47814 Spondylosis without myelopathy or radiculopathy, thoracic region: Secondary | ICD-10-CM | POA: Diagnosis not present

## 2021-01-17 DIAGNOSIS — M50123 Cervical disc disorder at C6-C7 level with radiculopathy: Secondary | ICD-10-CM | POA: Diagnosis not present

## 2021-01-17 NOTE — Telephone Encounter (Signed)
Adylee Paget KeyMarland Kitchen - Rx #: [id number] Need help? Call us at [phone] Status Sent to [facility] Emgality 120MG /ML auto-injectors (migraine) Form [employer] [place] [employer] Electronic Request Form (CB) Original Claim Info 75

## 2021-02-02 ENCOUNTER — Telehealth: Payer: Self-pay

## 2021-02-02 NOTE — Telephone Encounter (Signed)
Emgality denied. Left message for patient to call [employer] and see what is covered. Emglaity 120mg /41ml .

## 2021-03-06 ENCOUNTER — Ambulatory Visit: Payer: BC Managed Care – PPO | Admitting: Neurology

## 2021-03-08 ENCOUNTER — Ambulatory Visit: Payer: BC Managed Care – PPO | Admitting: Neurology

## 2021-03-14 ENCOUNTER — Ambulatory Visit: Payer: BC Managed Care – PPO | Admitting: Neurology

## 2021-03-14 DIAGNOSIS — H6980 Other specified disorders of Eustachian tube, unspecified ear: Secondary | ICD-10-CM | POA: Diagnosis not present

## 2021-03-26 ENCOUNTER — Other Ambulatory Visit: Payer: Self-pay | Admitting: Psychiatry

## 2021-03-26 DIAGNOSIS — F331 Major depressive disorder, recurrent, moderate: Secondary | ICD-10-CM

## 2021-03-27 NOTE — Telephone Encounter (Signed)
Looks like pt missing a sig amount of medication

## 2021-03-30 ENCOUNTER — Other Ambulatory Visit: Payer: Self-pay | Admitting: Psychiatry

## 2021-03-30 DIAGNOSIS — F331 Major depressive disorder, recurrent, moderate: Secondary | ICD-10-CM

## 2021-03-30 DIAGNOSIS — F5105 Insomnia due to other mental disorder: Secondary | ICD-10-CM

## 2021-04-06 ENCOUNTER — Other Ambulatory Visit: Payer: Self-pay | Admitting: Psychiatry

## 2021-04-06 DIAGNOSIS — F331 Major depressive disorder, recurrent, moderate: Secondary | ICD-10-CM

## 2021-04-09 ENCOUNTER — Ambulatory Visit: Payer: BC Managed Care – PPO | Admitting: Psychiatry

## 2021-04-12 DIAGNOSIS — H903 Sensorineural hearing loss, bilateral: Secondary | ICD-10-CM | POA: Diagnosis not present

## 2021-04-13 ENCOUNTER — Other Ambulatory Visit: Payer: Self-pay | Admitting: Psychiatry

## 2021-04-13 DIAGNOSIS — F331 Major depressive disorder, recurrent, moderate: Secondary | ICD-10-CM

## 2021-04-13 DIAGNOSIS — F411 Generalized anxiety disorder: Secondary | ICD-10-CM

## 2021-04-13 DIAGNOSIS — F4010 Social phobia, unspecified: Secondary | ICD-10-CM

## 2021-04-13 DIAGNOSIS — F4001 Agoraphobia with panic disorder: Secondary | ICD-10-CM

## 2021-05-07 DIAGNOSIS — M9902 Segmental and somatic dysfunction of thoracic region: Secondary | ICD-10-CM | POA: Diagnosis not present

## 2021-05-07 DIAGNOSIS — M9901 Segmental and somatic dysfunction of cervical region: Secondary | ICD-10-CM | POA: Diagnosis not present

## 2021-05-07 DIAGNOSIS — G4486 Cervicogenic headache: Secondary | ICD-10-CM | POA: Diagnosis not present

## 2021-05-07 DIAGNOSIS — M542 Cervicalgia: Secondary | ICD-10-CM | POA: Diagnosis not present

## 2021-05-09 DIAGNOSIS — G4486 Cervicogenic headache: Secondary | ICD-10-CM | POA: Diagnosis not present

## 2021-05-09 DIAGNOSIS — M542 Cervicalgia: Secondary | ICD-10-CM | POA: Diagnosis not present

## 2021-05-09 DIAGNOSIS — M9901 Segmental and somatic dysfunction of cervical region: Secondary | ICD-10-CM | POA: Diagnosis not present

## 2021-05-09 DIAGNOSIS — M9902 Segmental and somatic dysfunction of thoracic region: Secondary | ICD-10-CM | POA: Diagnosis not present

## 2021-05-12 ENCOUNTER — Other Ambulatory Visit: Payer: Self-pay | Admitting: Psychiatry

## 2021-05-12 DIAGNOSIS — F331 Major depressive disorder, recurrent, moderate: Secondary | ICD-10-CM

## 2021-05-14 DIAGNOSIS — G4486 Cervicogenic headache: Secondary | ICD-10-CM | POA: Diagnosis not present

## 2021-05-14 DIAGNOSIS — M542 Cervicalgia: Secondary | ICD-10-CM | POA: Diagnosis not present

## 2021-05-14 DIAGNOSIS — M9901 Segmental and somatic dysfunction of cervical region: Secondary | ICD-10-CM | POA: Diagnosis not present

## 2021-05-14 DIAGNOSIS — M9902 Segmental and somatic dysfunction of thoracic region: Secondary | ICD-10-CM | POA: Diagnosis not present

## 2021-05-14 NOTE — Telephone Encounter (Signed)
FYI she no show in Sept

## 2021-05-17 DIAGNOSIS — M9902 Segmental and somatic dysfunction of thoracic region: Secondary | ICD-10-CM | POA: Diagnosis not present

## 2021-05-17 DIAGNOSIS — M542 Cervicalgia: Secondary | ICD-10-CM | POA: Diagnosis not present

## 2021-05-17 DIAGNOSIS — G4486 Cervicogenic headache: Secondary | ICD-10-CM | POA: Diagnosis not present

## 2021-05-17 DIAGNOSIS — M9901 Segmental and somatic dysfunction of cervical region: Secondary | ICD-10-CM | POA: Diagnosis not present

## 2021-05-21 DIAGNOSIS — G4486 Cervicogenic headache: Secondary | ICD-10-CM | POA: Diagnosis not present

## 2021-05-21 DIAGNOSIS — M9901 Segmental and somatic dysfunction of cervical region: Secondary | ICD-10-CM | POA: Diagnosis not present

## 2021-05-21 DIAGNOSIS — M9902 Segmental and somatic dysfunction of thoracic region: Secondary | ICD-10-CM | POA: Diagnosis not present

## 2021-05-21 DIAGNOSIS — M542 Cervicalgia: Secondary | ICD-10-CM | POA: Diagnosis not present

## 2021-05-25 DIAGNOSIS — M9902 Segmental and somatic dysfunction of thoracic region: Secondary | ICD-10-CM | POA: Diagnosis not present

## 2021-05-25 DIAGNOSIS — G4486 Cervicogenic headache: Secondary | ICD-10-CM | POA: Diagnosis not present

## 2021-05-25 DIAGNOSIS — M542 Cervicalgia: Secondary | ICD-10-CM | POA: Diagnosis not present

## 2021-05-25 DIAGNOSIS — M9901 Segmental and somatic dysfunction of cervical region: Secondary | ICD-10-CM | POA: Diagnosis not present

## 2021-05-28 DIAGNOSIS — G4486 Cervicogenic headache: Secondary | ICD-10-CM | POA: Diagnosis not present

## 2021-05-28 DIAGNOSIS — M9901 Segmental and somatic dysfunction of cervical region: Secondary | ICD-10-CM | POA: Diagnosis not present

## 2021-05-28 DIAGNOSIS — M9902 Segmental and somatic dysfunction of thoracic region: Secondary | ICD-10-CM | POA: Diagnosis not present

## 2021-05-28 DIAGNOSIS — M9903 Segmental and somatic dysfunction of lumbar region: Secondary | ICD-10-CM | POA: Diagnosis not present

## 2021-06-02 ENCOUNTER — Other Ambulatory Visit: Payer: Self-pay | Admitting: Psychiatry

## 2021-06-02 DIAGNOSIS — F331 Major depressive disorder, recurrent, moderate: Secondary | ICD-10-CM

## 2021-06-05 DIAGNOSIS — M9903 Segmental and somatic dysfunction of lumbar region: Secondary | ICD-10-CM | POA: Diagnosis not present

## 2021-06-05 DIAGNOSIS — M9901 Segmental and somatic dysfunction of cervical region: Secondary | ICD-10-CM | POA: Diagnosis not present

## 2021-06-05 DIAGNOSIS — G4486 Cervicogenic headache: Secondary | ICD-10-CM | POA: Diagnosis not present

## 2021-06-05 DIAGNOSIS — M9902 Segmental and somatic dysfunction of thoracic region: Secondary | ICD-10-CM | POA: Diagnosis not present

## 2021-06-11 DIAGNOSIS — Z1231 Encounter for screening mammogram for malignant neoplasm of breast: Secondary | ICD-10-CM | POA: Diagnosis not present

## 2021-06-12 DIAGNOSIS — M9903 Segmental and somatic dysfunction of lumbar region: Secondary | ICD-10-CM | POA: Diagnosis not present

## 2021-06-12 DIAGNOSIS — G4486 Cervicogenic headache: Secondary | ICD-10-CM | POA: Diagnosis not present

## 2021-06-12 DIAGNOSIS — M9902 Segmental and somatic dysfunction of thoracic region: Secondary | ICD-10-CM | POA: Diagnosis not present

## 2021-06-12 DIAGNOSIS — M9901 Segmental and somatic dysfunction of cervical region: Secondary | ICD-10-CM | POA: Diagnosis not present

## 2021-06-13 ENCOUNTER — Encounter: Payer: Self-pay | Admitting: Psychiatry

## 2021-06-13 ENCOUNTER — Ambulatory Visit (INDEPENDENT_AMBULATORY_CARE_PROVIDER_SITE_OTHER): Payer: BC Managed Care – PPO | Admitting: Psychiatry

## 2021-06-13 ENCOUNTER — Other Ambulatory Visit: Payer: Self-pay

## 2021-06-13 DIAGNOSIS — F4001 Agoraphobia with panic disorder: Secondary | ICD-10-CM | POA: Diagnosis not present

## 2021-06-13 DIAGNOSIS — F900 Attention-deficit hyperactivity disorder, predominantly inattentive type: Secondary | ICD-10-CM

## 2021-06-13 DIAGNOSIS — F4010 Social phobia, unspecified: Secondary | ICD-10-CM

## 2021-06-13 DIAGNOSIS — F411 Generalized anxiety disorder: Secondary | ICD-10-CM

## 2021-06-13 DIAGNOSIS — F331 Major depressive disorder, recurrent, moderate: Secondary | ICD-10-CM | POA: Diagnosis not present

## 2021-06-13 DIAGNOSIS — R413 Other amnesia: Secondary | ICD-10-CM

## 2021-06-13 DIAGNOSIS — F5105 Insomnia due to other mental disorder: Secondary | ICD-10-CM

## 2021-06-13 DIAGNOSIS — R7989 Other specified abnormal findings of blood chemistry: Secondary | ICD-10-CM

## 2021-06-13 DIAGNOSIS — M9901 Segmental and somatic dysfunction of cervical region: Secondary | ICD-10-CM | POA: Diagnosis not present

## 2021-06-13 DIAGNOSIS — M9902 Segmental and somatic dysfunction of thoracic region: Secondary | ICD-10-CM | POA: Diagnosis not present

## 2021-06-13 DIAGNOSIS — G4486 Cervicogenic headache: Secondary | ICD-10-CM | POA: Diagnosis not present

## 2021-06-13 DIAGNOSIS — Z79899 Other long term (current) drug therapy: Secondary | ICD-10-CM

## 2021-06-13 DIAGNOSIS — M9903 Segmental and somatic dysfunction of lumbar region: Secondary | ICD-10-CM | POA: Diagnosis not present

## 2021-06-13 MED ORDER — PAROXETINE HCL 30 MG PO TABS: 30.0000 mg | ORAL_TABLET | Freq: Every day | ORAL | 0 refills | Status: DC

## 2021-06-13 MED ORDER — LAMOTRIGINE 150 MG PO TABS: 150.0000 mg | ORAL_TABLET | Freq: Two times a day (BID) | ORAL | 0 refills | Status: DC

## 2021-06-13 MED ORDER — LITHIUM CARBONATE ER 300 MG PO TBCR: 600.0000 mg | EXTENDED_RELEASE_TABLET | Freq: Every day | ORAL | 0 refills | Status: DC

## 2021-06-13 MED ORDER — QUETIAPINE FUMARATE 50 MG PO TABS: 50.0000 mg | ORAL_TABLET | Freq: Every day | ORAL | 0 refills | Status: DC

## 2021-06-13 NOTE — Patient Instructions (Addendum)
Reduce lamotrigine to 1/2 in AM and 1 tablet at night for a month,  Then reduce to 1 tablet daily. Watch for any increase in depression.  Get labs

## 2021-06-13 NOTE — Progress Notes (Signed)
[name] [id number] May 06, 1971 50 y.o.  Subjective:   Patient ID:  [name] is a 50 y.o. (DOB 1970-10-23) female.  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  At appt Sep 02, 2018. Raised Concerta to 27 mg but then she had anxiety and couple panic and so it was reduced to 18 mg and anxiety resolved.  appointment May 2020.  No meds were changed.  November 2020 appointment with the following noted: Out of the stimulant.  Didn't pursue it as not critical..  Overall OK considering social matters. Concerned about her memory.  Loses track in conversation.  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 Overall pretty good mood.  Still tired in the am but less so with IR Seroquel lower dose..  Some anxiety with Covid.  Wasn't following thoughts to dark and scary places.  Loves being at home so ok. Getting things done and that feels really good.  Put off home schooling until July.  Better mood. Bc tremor she reduced lithium from 1200 to 900 on April 15 and the tremor is better not gone and feels the same.  No SI since here. Still has to nap and rest at times with Concerta but it seemed to help, but hard to explain.  Clarity better.  Easier to make decisions.  Don't feel as daft.  Still hard listening to sermons, drift off all my life.  Is doing better with home schooling.  Still night eating.  Sleep 8-10 hours. Plan restart Concerta and check labs for cognition.  01/20/2020 appointment with the following noted: Patient should have been seen since November before this time. Did not do well with Concerta 18  bc too anxious. Perimenopausal.  Moody.  Some irritability. Struggling a lot with migraine but seems to be getting better.  Gets emotionally overwhelmed but not real dark moods.  Some  sadness. Productivity is better lately. 1 panic since here and thought was having MI.  Anxiety is better now.  Tax season hard. Sleep great with routine.  Fidgety all the time is somewhat embarrassing.  No discomfort. Started diet and less nighteating now over the last couple of weeks.  Plan: For memory complaints check B12 and folate and lamotrigine level.  Also need to check lithium and BMP.  PCP checks thyroid.  10/11/20 appt noted: Pretty good.  Depression under control.  Anxiety is OK unless too much caffeine.   Patient reports stable mood and denies depressed or irritable moods.  Patient denies any recent difficulty with anxiety.  Patient denies difficulty with sleep initiation or maintenance. Denies appetite disturbance.  Patient reports that energy and motivation have been good.  Patient denies any difficulty with concentration.  Patient denies any suicidal ideation. Has a little tremor.   Saw benefit from NAC for cognition but ran out bc difficulty getting it. Still really hungry at night after Seroquel.  Asks about reduction. Plan: Trial propranolol 20-40 mg prn tremor  06/13/21 appt noted: Never tried propranolol. Decreased lithium to 2 tablets at night for 5-6 mos to get rid of tremor and ready to decrease stuff. Wants to decrease more.  Tried to reduce quetiapine from 100 to 50 and couldn't sleep. Sleep 8 hours. Depression under control.  Mild anxiety panic flutters before Scott's surgery and channeledLESHAWN HOUSEWORTHit  into cleaning.Lorin Picket doing well now after surgery for diverticulitis. Dr. Richarda Overlie.    Past Psychiatric Medication Trials:  Concerta 18 SE, modafinil 200 side effect, Ritalin, Loxitane 60, Seroquel XR 600, Abilify, Rexulti side effects,  risperidone lamotrigine 300,  lithium, Depakote side effects, buspirone 30 twice daily, propranolol, gabapentin, topiramate, , imipramine, venlafaxine, sertraline, Lexapro 20 nr, Wellbutrin,  selegiline, duloxetine side  effects, temazepam Deplin,   Relpax depression  Review of Systems:  Negative for weakness, + weight gain, trmor, connstipation , reflux, some HA better with chiropracter, hearing is getting worse. No CP, cough  Medications: I have reviewed the patient's current medications.  Current Outpatient Medications  Medication Sig Dispense Refill   b complex vitamins tablet Take 1 tablet by mouth daily.     Cholecalciferol (D3-1000) 25 MCG (1000 UT) capsule Take 1,000 Units by mouth daily.     Docusate Calcium (STOOL SOFTENER PO) Take by mouth 3 (three) times daily.     levothyroxine (SYNTHROID, LEVOTHROID) 125 MCG tablet Take 125 mcg by mouth daily before breakfast.      Magnesium 200 MG TABS Take by mouth daily.     Multiple Vitamins-Minerals (WOMENS MULTIVITAMIN PLUS PO) Take 1 tablet by mouth daily.      Polyethylene Glycol 3350 (MIRALAX PO) Take by mouth daily.     SUMAtriptan (TOSYMRA) 10 MG/ACT SOLN Place 10 mg into the nose as needed (May repeat in 1 hour.  Maximum 3 sprays in 24 hours). 6 each 5   verapamil (CALAN-SR) 240 MG CR tablet Take 240 mg by mouth daily.     Acetylcysteine 600 MG CAPS Take 600 mg by mouth 2 (two) times daily. (Patient not taking: No sig reported)     Coenzyme Q10 (CO Q 10) 100 MG CAPS Take 200 mg by mouth daily. (Patient not taking: No sig reported)     Galcanezumab-gnlm (EMGALITY) 120 MG/ML SOAJ Inject 120 mg into the skin every 28 (twenty-eight) days. (Patient not taking: Reported on 06/13/2021) 1.12 mL 5   lamoTRIgine (LAMICTAL) 150 MG tablet Take 1 tablet (150 mg total) by mouth 2 (two) times daily. 180 tablet 0   lithium carbonate (LITHOBID) 300 MG CR tablet Take 2 tablets (600 mg total) by mouth daily. 180 tablet 0   omeprazole (PRILOSEC) 20 MG capsule Take 1 capsule (20 mg total) by mouth daily. (Patient not taking: No sig reported) 30 capsule 0   PARoxetine (PAXIL) 30 MG tablet Take 1 tablet (30 mg total) by mouth daily. 90 tablet 0   QUEtiapine (SEROQUEL) 50  MG tablet Take 1-2 tablets (50-100 mg total) by mouth at bedtime. 180 tablet 0   sucralfate (CARAFATE) 1 g tablet Take 1 tablet (1 g total) by mouth 4 (four) times daily -  with meals and at bedtime for 14 days. 56 tablet 0   No current facility-administered medications for this visit.    Medication Side Effects: Other: night eating may be related. , tremor  Allergies:  Allergies  Allergen Reactions   Perphenazine Anaphylaxis and Swelling    Throat swelling and facial muscle contractions     Imipramine Other (See Comments)    Can't remember adverse effect   Penicillins Rash   Albuterol    Other     Phenegran   Codeine Rash    Past Medical History:  Diagnosis Date   Depression    Hashimoto's disease    Kidney stones    Migraine     Family History  Problem  Relation Age of Onset   Depression Mother    Migraines Mother    Obesity Mother    Rheum arthritis Sister    Migraines Sister    Hypertension Sister     Social History   Socioeconomic History   Marital status: Married    Spouse name: Acupuncturist   Number of children: 4   Years of education: 14   Highest education level: Some college, no degree  Occupational History   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 She lives with her husband and 4 children in a one level home. She drinks one glass of tea (8-16 oz) a day. She does not exercise. Some college.   Social Determinants of Health   Financial Resource Strain: Not on file  Food Insecurity: Not on file  Transportation Needs: Not on file  Physical Activity: Not on file   Stress: Not on file  Social Connections: Not on file  Intimate Partner Violence: Not on file    Past Medical History, Surgical history, Social history, and Family history were reviewed and updated as appropriate.   Please see review of systems for further details on the patient's review from today.   Objective:   Physical Exam:  There were no vitals taken for this visit.  Physical Exam Constitutional:      General: She is not in acute distress.    Appearance: She is well-developed.  Musculoskeletal:        General: No deformity.  Neurological:     Mental Status: She is alert and oriented to person, place, and time.     Motor: No tremor.     Coordination: Coordination normal.     Gait: Gait normal.  Psychiatric:        Attention and Perception: She is attentive.        Mood and Affect: Mood is not anxious or depressed. Affect is not labile, blunt, angry or inappropriate.        Speech: Speech normal.        Behavior: Behavior normal.        Thought Content: Thought content is not paranoid. Thought content does not include homicidal or suicidal ideation.        Cognition and Memory: Cognition normal.        Judgment: Judgment normal.     Comments: Insight and general self-awareness is mildly -moderately impaired chronically.    Lab Review:     Component Value Date/Time   NA 137 09/10/2020 1906   K 3.7 09/10/2020 1906   CL 108 09/10/2020 1906   CO2 19 (L) 09/10/2020 1906   GLUCOSE 113 (H) 09/10/2020 1906   BUN 20 09/10/2020 1906   CREATININE 0.92 09/10/2020 1906   CREATININE 0.85 05/10/2020 0940   CALCIUM 10.4 (H) 09/10/2020 1906   PROT 7.2 09/10/2020 1906   ALBUMIN 4.0 09/10/2020 1906   AST 20 09/10/2020 1906   ALT 17 09/10/2020 1906   ALKPHOS 89 09/10/2020 1906   BILITOT 0.6 09/10/2020 1906   GFRNONAA >  60 09/10/2020 1906   GFRAA >90 08/23/2013 1600       Component Value Date/Time   WBC 9.9 09/10/2020 1906   RBC 4.94 09/10/2020 1906   HGB 13.8 09/10/2020  1906   HCT 42.2 09/10/2020 1906   PLT 268 09/10/2020 1906   MCV 85.4 09/10/2020 1906   MCH 27.9 09/10/2020 1906   MCHC 32.7 09/10/2020 1906   RDW 14.0 09/10/2020 1906   LYMPHSABS 2.1 01/17/2011 1812   MONOABS 0.7 01/17/2011 1812   EOSABS 0.1 01/17/2011 1812   BASOSABS 0.0 01/17/2011 1812    Lithium Lvl  Date Value Ref Range Status  05/10/2020 0.7 0.6 - 1.2 mmol/L Final     No results found for: PHENYTOIN, PHENOBARB, VALPROATE, CBMZ   .res Assessment: Plan:    Major depressive disorder, recurrent episode, moderate (HCC) - Plan: Lithium level, Basic metabolic panel, TSH, lamoTRIgine (LAMICTAL) 150 MG tablet, lithium carbonate (LITHOBID) 300 MG CR tablet, PARoxetine (PAXIL) 30 MG tablet, QUEtiapine (SEROQUEL) 50 MG tablet  Panic disorder with agoraphobia - Plan: PARoxetine (PAXIL) 30 MG tablet  Generalized anxiety disorder - Plan: PARoxetine (PAXIL) 30 MG tablet  Social anxiety disorder - Plan: PARoxetine (PAXIL) 30 MG tablet  Attention deficit hyperactivity disorder (ADHD), predominantly inattentive type  Insomnia due to mental condition - Plan: QUEtiapine (SEROQUEL) 50 MG tablet  Lithium use - Plan: Lithium level, Basic metabolic panel, TSH  Low vitamin D level  Memory change   Hx severe TRD with Suicide attempt.  Insight and general self-awareness is moderately impaired chronically complicating and delaying treatment.  Also she has not kept appointments as frequently as recommended.  She is not having panic attacks and overall depression is reasonably managed though her motivation and energy are low.  She is somewhat chronically dysfunctional to some degree. Disc her questions about memory effects of meds.  Consider reduction of lamotrigine if laboratory tests do not show reasons for memory problems..  For memory complaints continue NAC 600 bc it helped. Disc availabilty problems.  Couldn't tolerate Concerta DT anxiety.  She still night eating which might possibly be  related to quetiapine.  OK reduce to 50 mg HS .  May taper lower bc no mood effects that low. But she can't sleep if it gets too low. Discussed potential metabolic side effects associated with atypical antipsychotics, as well as potential risk for movement side effects. Advised pt to contact office if movement side effects occur.   Counseled patient regarding potential benefits, risks, and side effects of lithium to include potential risk of lithium affecting thyroid and renal function.  Discussed need for periodic lab monitoring to determine drug level and to assess for potential adverse effects.  Counseled patient regarding signs and symptoms of lithium toxicity and advised that they notify office immediately or seek urgent medical attention if experiencing these signs and symptoms.  Patient advised to contact office with any questions or concerns. Lithium level good October. OK continue lower dose lithium 600 mg daily but not lower. Check lithium labls  Ok trial reduction in lamotrigine gradually to 150 mg daily over a month. Continue paroxetine 30 mg daily.  Constipation managed  Option off label Aricept.  Don't change meds on your own DT severe illness in the past.  This appt was 30 mins.  FU 4 mos  Meredith Staggers, MD, DFAPA   Please see After Visit Summary for patient specific instructions.  No future appointments.   Orders Placed This Encounter  Procedures   Lithium level  Basic metabolic panel   TSH       -------------------------------

## 2021-06-15 DIAGNOSIS — G4486 Cervicogenic headache: Secondary | ICD-10-CM | POA: Diagnosis not present

## 2021-06-15 DIAGNOSIS — M9902 Segmental and somatic dysfunction of thoracic region: Secondary | ICD-10-CM | POA: Diagnosis not present

## 2021-06-15 DIAGNOSIS — M9901 Segmental and somatic dysfunction of cervical region: Secondary | ICD-10-CM | POA: Diagnosis not present

## 2021-06-15 DIAGNOSIS — M9903 Segmental and somatic dysfunction of lumbar region: Secondary | ICD-10-CM | POA: Diagnosis not present

## 2021-06-18 DIAGNOSIS — G4486 Cervicogenic headache: Secondary | ICD-10-CM | POA: Diagnosis not present

## 2021-06-18 DIAGNOSIS — M9902 Segmental and somatic dysfunction of thoracic region: Secondary | ICD-10-CM | POA: Diagnosis not present

## 2021-06-18 DIAGNOSIS — M9901 Segmental and somatic dysfunction of cervical region: Secondary | ICD-10-CM | POA: Diagnosis not present

## 2021-06-18 DIAGNOSIS — M9903 Segmental and somatic dysfunction of lumbar region: Secondary | ICD-10-CM | POA: Diagnosis not present

## 2021-06-20 DIAGNOSIS — G4486 Cervicogenic headache: Secondary | ICD-10-CM | POA: Diagnosis not present

## 2021-06-20 DIAGNOSIS — R7303 Prediabetes: Secondary | ICD-10-CM | POA: Diagnosis not present

## 2021-06-20 DIAGNOSIS — G43009 Migraine without aura, not intractable, without status migrainosus: Secondary | ICD-10-CM | POA: Diagnosis not present

## 2021-06-20 DIAGNOSIS — M9901 Segmental and somatic dysfunction of cervical region: Secondary | ICD-10-CM | POA: Diagnosis not present

## 2021-06-20 DIAGNOSIS — M9903 Segmental and somatic dysfunction of lumbar region: Secondary | ICD-10-CM | POA: Diagnosis not present

## 2021-06-20 DIAGNOSIS — E559 Vitamin D deficiency, unspecified: Secondary | ICD-10-CM | POA: Diagnosis not present

## 2021-06-20 DIAGNOSIS — E063 Autoimmune thyroiditis: Secondary | ICD-10-CM | POA: Diagnosis not present

## 2021-06-20 DIAGNOSIS — I10 Essential (primary) hypertension: Secondary | ICD-10-CM | POA: Diagnosis not present

## 2021-06-20 DIAGNOSIS — M9902 Segmental and somatic dysfunction of thoracic region: Secondary | ICD-10-CM | POA: Diagnosis not present

## 2021-06-25 DIAGNOSIS — M9901 Segmental and somatic dysfunction of cervical region: Secondary | ICD-10-CM | POA: Diagnosis not present

## 2021-06-25 DIAGNOSIS — M9902 Segmental and somatic dysfunction of thoracic region: Secondary | ICD-10-CM | POA: Diagnosis not present

## 2021-06-25 DIAGNOSIS — M9903 Segmental and somatic dysfunction of lumbar region: Secondary | ICD-10-CM | POA: Diagnosis not present

## 2021-06-25 DIAGNOSIS — G4486 Cervicogenic headache: Secondary | ICD-10-CM | POA: Diagnosis not present

## 2021-06-28 ENCOUNTER — Other Ambulatory Visit: Payer: Self-pay | Admitting: Psychiatry

## 2021-07-09 DIAGNOSIS — M9903 Segmental and somatic dysfunction of lumbar region: Secondary | ICD-10-CM | POA: Diagnosis not present

## 2021-07-09 DIAGNOSIS — M9902 Segmental and somatic dysfunction of thoracic region: Secondary | ICD-10-CM | POA: Diagnosis not present

## 2021-07-09 DIAGNOSIS — M9901 Segmental and somatic dysfunction of cervical region: Secondary | ICD-10-CM | POA: Diagnosis not present

## 2021-07-09 DIAGNOSIS — G4486 Cervicogenic headache: Secondary | ICD-10-CM | POA: Diagnosis not present

## 2021-07-16 DIAGNOSIS — M9902 Segmental and somatic dysfunction of thoracic region: Secondary | ICD-10-CM | POA: Diagnosis not present

## 2021-07-16 DIAGNOSIS — M9903 Segmental and somatic dysfunction of lumbar region: Secondary | ICD-10-CM | POA: Diagnosis not present

## 2021-07-16 DIAGNOSIS — G4486 Cervicogenic headache: Secondary | ICD-10-CM | POA: Diagnosis not present

## 2021-07-16 DIAGNOSIS — M9901 Segmental and somatic dysfunction of cervical region: Secondary | ICD-10-CM | POA: Diagnosis not present

## 2021-07-27 DIAGNOSIS — M9901 Segmental and somatic dysfunction of cervical region: Secondary | ICD-10-CM | POA: Diagnosis not present

## 2021-07-27 DIAGNOSIS — G4486 Cervicogenic headache: Secondary | ICD-10-CM | POA: Diagnosis not present

## 2021-07-27 DIAGNOSIS — M9903 Segmental and somatic dysfunction of lumbar region: Secondary | ICD-10-CM | POA: Diagnosis not present

## 2021-07-27 DIAGNOSIS — M9902 Segmental and somatic dysfunction of thoracic region: Secondary | ICD-10-CM | POA: Diagnosis not present

## 2021-08-13 DIAGNOSIS — J0100 Acute maxillary sinusitis, unspecified: Secondary | ICD-10-CM | POA: Diagnosis not present

## 2021-08-13 DIAGNOSIS — B349 Viral infection, unspecified: Secondary | ICD-10-CM | POA: Diagnosis not present

## 2021-08-17 DIAGNOSIS — R109 Unspecified abdominal pain: Secondary | ICD-10-CM | POA: Diagnosis not present

## 2021-08-18 ENCOUNTER — Emergency Department (HOSPITAL_BASED_OUTPATIENT_CLINIC_OR_DEPARTMENT_OTHER)
Admission: EM | Admit: 2021-08-18 | Discharge: 2021-08-18 | Disposition: A | Discharge status: Home / Self Care | Payer: BC Managed Care – PPO | Attending: Emergency Medicine | Admitting: Emergency Medicine

## 2021-08-18 ENCOUNTER — Encounter (HOSPITAL_BASED_OUTPATIENT_CLINIC_OR_DEPARTMENT_OTHER): Payer: Self-pay

## 2021-08-18 ENCOUNTER — Emergency Department (HOSPITAL_BASED_OUTPATIENT_CLINIC_OR_DEPARTMENT_OTHER): Payer: BC Managed Care – PPO

## 2021-08-18 ENCOUNTER — Other Ambulatory Visit: Payer: Self-pay

## 2021-08-18 DIAGNOSIS — R109 Unspecified abdominal pain: Secondary | ICD-10-CM | POA: Diagnosis not present

## 2021-08-18 DIAGNOSIS — R1011 Right upper quadrant pain: Secondary | ICD-10-CM | POA: Diagnosis not present

## 2021-08-18 DIAGNOSIS — R110 Nausea: Secondary | ICD-10-CM | POA: Diagnosis not present

## 2021-08-18 DIAGNOSIS — K8020 Calculus of gallbladder without cholecystitis without obstruction: Secondary | ICD-10-CM | POA: Diagnosis not present

## 2021-08-18 LAB — COMPREHENSIVE METABOLIC PANEL
ALT: 35 U/L (ref 0–44)
AST: 27 U/L (ref 15–41)
Albumin: 4.5 g/dL (ref 3.5–5.0)
Alkaline Phosphatase: 90 U/L (ref 38–126)
Anion gap: 7 (ref 5–15)
BUN: 16 mg/dL (ref 6–20)
CO2: 27 mmol/L (ref 22–32)
Calcium: 9.8 mg/dL (ref 8.9–10.3)
Chloride: 107 mmol/L (ref 98–111)
Creatinine, Ser: 0.78 mg/dL (ref 0.44–1.00)
GFR, Estimated: >60 mL/min (ref >60)
Glucose, Bld: 58 mg/dL — ABNORMAL LOW (ref 70–99)
Potassium: 3.7 mmol/L (ref 3.5–5.1)
Sodium: 141 mmol/L (ref 135–145)
Total Bilirubin: 0.3 mg/dL (ref 0.3–1.2)
Total Protein: 7.9 g/dL (ref 6.5–8.1)

## 2021-08-18 LAB — URINALYSIS, ROUTINE W REFLEX MICROSCOPIC
Bilirubin Urine: NEGATIVE
Glucose, UA: NEGATIVE mg/dL
Hgb urine dipstick: NEGATIVE
Ketones, ur: NEGATIVE mg/dL
Leukocytes,Ua: NEGATIVE
Nitrite: NEGATIVE
Protein, ur: NEGATIVE mg/dL
Specific Gravity, Urine: 1.006 (ref 1.005–1.030)
pH: 6.5 (ref 5.0–8.0)

## 2021-08-18 LAB — CBC
HCT: 40.8 % (ref 36.0–46.0)
Hemoglobin: 13.3 g/dL (ref 12.0–15.0)
MCH: 29.4 pg (ref 26.0–34.0)
MCHC: 32.6 g/dL (ref 30.0–36.0)
MCV: 90.1 fL (ref 80.0–100.0)
Platelets: 286 10*3/uL (ref 150–400)
RBC: 4.53 MIL/uL (ref 3.87–5.11)
RDW: 12.6 % (ref 11.5–15.5)
WBC: 7.4 10*3/uL (ref 4.0–10.5)
nRBC: 0 % (ref 0.0–0.2)

## 2021-08-18 LAB — PREGNANCY, URINE: Preg Test, Ur: NEGATIVE

## 2021-08-18 LAB — LIPASE, BLOOD: Lipase: 45 U/L (ref 11–51)

## 2021-08-18 LAB — OCCULT BLOOD X 1 CARD TO LAB, STOOL: Fecal Occult Bld: NEGATIVE

## 2021-08-18 MED ORDER — ONDANSETRON 4 MG PO TBDP
8.0000 mg | ORAL_TABLET | Freq: Once | ORAL | Status: AC
  Administered 2021-08-18: 8 mg via ORAL
  Filled 2021-08-18: qty 2

## 2021-08-18 MED ORDER — DICYCLOMINE HCL 10 MG/ML IM SOLN
20.0000 mg | Freq: Once | INTRAMUSCULAR | Status: AC
Start: 2021-08-18 — End: 2021-08-18
  Administered 2021-08-18: 20 mg via INTRAMUSCULAR
  Filled 2021-08-18: qty 2

## 2021-08-18 MED ORDER — FAMOTIDINE 40 MG PO TABS: 40.0000 mg | ORAL_TABLET | Freq: Every day | ORAL | 0 refills | Status: DC

## 2021-08-18 NOTE — ED Triage Notes (Signed)
Patient here POV from Home with ABD Pain.  Pain has been present for approximately 2-3 days. Pain has remained Constant. Pain is RUQ.  Pain is RUQ and is Non-Radiating. Mild Nausea. No Vomiting. No Diarrhea. Mild Constipation. No Urinary Symptoms.  NAD Noted during Triage. A&Ox4. GCS 15. Ambulatory.

## 2021-08-18 NOTE — Discharge Instructions (Addendum)
Your work-up today was reassuring.  Did not show any signs of systemic infection, additionally there is no blood in your stool.  You have gallstones in the gallbladder, I cannot know definitively if that is what is causing your pain but it may be contributing.  The pain also could be from irritation to the stomach lining.  I would advise trying pepcid for the next 2 weeks to see if that helps alleviate your symptoms.  Also avoid any anti-inflammatory medicine or fatty foods when possible.  Information provided for general surgery, they continue to have pain you may want to follow-up for reevaluation and the recommendations.  If you have fevers, vomiting, symptoms change she should return back to the ED for evaluation.

## 2021-08-18 NOTE — ED Provider Notes (Signed)
[facility] EMERGENCY DEPT Provider Note   CSN: [id number] Arrival date & time: 08/18/21  1149     History  Chief Complaint  Patient presents with   Abdominal Pain    [name] is a 51 y.o. female.   Abdominal Pain  Patient presents with right upper quadrant pain.  Started 2 to 3 days ago, its been constant.  Specimen she first wakes up in the morning, it worsens throughout the day with movement.  Associated with nausea but no vomiting or fevers.  Unable to identify anything it worsens the pain, Tylenol has not been particularly alleviating.  Pain does not radiate elsewhere, it feels sharp.  Of note, patient states she has been having dark stools intermittently for the last 3 weeks.  Not painful, to dark red appearing color.  Initially she thought it was related to hemorrhoids.  Not any blood thinners.   No previous abdominal surgeries.  Pain social: Patient does not drink alcohol, does not smoke.  History of antiphonatory medicine use, switch to Tylenol 2 to 3 months ago.  No history of gastric ulcers.  Normal colonoscopy, history of colon cancer in the family but not for the patient personally.  Does have history of hemorrhoids.  Home Medications Prior to Admission medications   Medication Sig Start Date End Date Taking? Authorizing Provider  famotidine (PEPCID) 40 MG tablet Take 1 tablet (40 mg total) by mouth daily. 08/18/21  Yes Theron Arista, PA-C  Acetylcysteine 600 MG CAPS Take 600 mg by mouth 2 (two) times daily. Patient not taking: No sig reported    [provider]  b complex vitamins tablet Take 1 tablet by mouth daily.    [provider]  Cholecalciferol (D3-1000) 25 MCG (1000 UT) capsule Take 1,000 Units by mouth daily.    [provider]  Coenzyme Q10 (CO Q 10) 100 MG CAPS Take 200 mg by mouth daily. Patient not taking: No sig reported    [provider]  Docusate Calcium (STOOL SOFTENER PO) Take by mouth 3  (three) times daily.    [provider]  Galcanezumab-gnlm (EMGALITY) 120 MG/ML SOAJ Inject 120 mg into the skin every 28 (twenty-eight) days. Patient not taking: Reported on 06/13/2021 09/05/20   Drema Dallas, DO  lamoTRIgine (LAMICTAL) 150 MG tablet Take 1 tablet (150 mg total) by mouth 2 (two) times daily. 06/13/21   Cottle, Steva Ready., MD  levothyroxine (SYNTHROID, LEVOTHROID) 125 MCG tablet Take 125 mcg by mouth daily before breakfast.     [provider]  lithium carbonate (LITHOBID) 300 MG CR tablet Take 2 tablets (600 mg total) by mouth daily. 06/13/21   Cottle, Steva Ready., MD  Magnesium 200 MG TABS Take by mouth daily.    [provider]  Multiple Vitamins-Minerals (WOMENS MULTIVITAMIN PLUS PO) Take 1 tablet by mouth daily.     [provider]  PARoxetine (PAXIL) 30 MG tablet Take 1 tablet (30 mg total) by mouth daily. 06/13/21   Cottle, Steva Ready., MD  Polyethylene Glycol 3350 (MIRALAX PO) Take by mouth daily.    [provider]  QUEtiapine (SEROQUEL) 100 MG tablet TAKE ONE TABLET BY MOUTH EVERY NIGHT AT BEDTIME 06/28/21   Cottle, Steva Ready., MD  QUEtiapine (SEROQUEL) 50 MG tablet Take 1-2 tablets (50-100 mg total) by mouth at bedtime. 06/13/21   Cottle, Steva Ready., MD  sucralfate (CARAFATE) 1 g tablet Take 1 tablet (1 g total) by mouth 4 (  four) times daily -  with meals and at bedtime for 14 days. 09/10/20 09/24/20  Placido SouJoldersma, Logan, PA-C  SUMAtriptan (TOSYMRA) 10 MG/ACT SOLN Place 10 mg into the nose as needed (May repeat in 1 hour.  Maximum 3 sprays in 24 hours). 09/05/20   Drema DallasJaffe, Adam R, DO  verapamil (CALAN-SR) 240 MG CR tablet Take 240 mg by mouth daily.    [provider]      Allergies    Perphenazine, Imipramine, Penicillins, Albuterol, Other, and Codeine    Review of Systems   Review of Systems  Gastrointestinal:  Positive for abdominal pain.   Physical Exam Updated Vital Signs BP 126/80 (BP Location: Right Arm)     Pulse 69    Temp 98.6 F (37 C)    Resp 16    Ht 5\' 3"  (1.6 m)    Wt 66.2 kg    SpO2 100%    BMI 25.85 kg/m  Physical Exam Vitals and nursing note reviewed.  Constitutional:      General: She is not in acute distress.    Appearance: She is well-developed.  HENT:     Head: Normocephalic and atraumatic.  Eyes:     Conjunctiva/sclera: Conjunctivae normal.  Cardiovascular:     Rate and Rhythm: Normal rate and regular rhythm.     Heart sounds: No murmur heard. Pulmonary:     Effort: Pulmonary effort is normal. No respiratory distress.     Breath sounds: Normal breath sounds.  Abdominal:     General: Abdomen is flat. Bowel sounds are normal.     Palpations: Abdomen is soft.     Tenderness: There is abdominal tenderness in the right upper quadrant. There is no right CVA tenderness, guarding or rebound. Negative signs include Murphy's sign and McBurney's sign.  Genitourinary:    Comments: Rectal exam performed with chaperone in room.  Patient has external hemorrhoid that is not actively bleeding.  Do not palpate any internal hemorrhoids, stool did not appear melanotic. Musculoskeletal:        General: No swelling.     Cervical back: Neck supple.  Skin:    General: Skin is warm and dry.     Capillary Refill: Capillary refill takes less than 2 seconds.  Neurological:     Mental Status: She is alert.  Psychiatric:        Mood and Affect: Mood normal.   ED Results / Procedures / Treatments   Labs (all labs ordered are listed, but only abnormal results are displayed) Labs Reviewed  COMPREHENSIVE METABOLIC PANEL - Abnormal; Notable for the following components:      Result Value   Glucose, Bld 58 (*)    All other components within normal limits  URINALYSIS, ROUTINE W REFLEX MICROSCOPIC - Abnormal; Notable for the following components:   Color, Urine COLORLESS (*)    All other components within normal limits  LIPASE, BLOOD  CBC  PREGNANCY, URINE  OCCULT BLOOD X 1 CARD TO LAB, STOOL   POC OCCULT BLOOD, ED    EKG None  Radiology US Abdomen Limited RUQ (LIVER/GB)  Result Date: 08/18/2021 CLINICAL DATA:  Periumbilical pain with nausea EXAM: ULTRASOUND ABDOMEN LIMITED RIGHT UPPER QUADRANT COMPARISON:  CT 09/10/2020 FINDINGS: Gallbladder: Multiple gallstones. Normal wall thickness. Negative sonographic Murphy. Common bile duct: Diameter: 2 mm Liver: No focal lesion identified. Within normal limits in parenchymal echogenicity. Portal vein is patent on color Doppler imaging with normal direction of blood flow towards the liver. Other: None. IMPRESSION:  Cholelithiasis without sonographic evidence for acute cholecystitis. Electronically Signed   By: Jasmine Pang M.D.   On: 08/18/2021 15:22    Procedures Procedures    Medications Ordered in ED Medications  ondansetron (ZOFRAN-ODT) disintegrating tablet 8 mg (8 mg Oral Given 08/18/21 1528)  dicyclomine (BENTYL) injection 20 mg (20 mg Intramuscular Given 08/18/21 1529)    ED Course/ Medical Decision Making/ A&P                            Patient presents with right upper quadrant tenderness.  Her vital signs are stable, she is not febrile or tachycardic.  No peritoneal signs concerning for acute abdomen.   Additional history obtained: -Additional history obtained from spouse, chart review -External records from outside source obtained and reviewed including: Chart review including previous notes, labs, imaging, consultation notes   Lab Tests: -I ordered, reviewed, and interpreted labs.  The pertinent results include: CBC with stable hemoglobin, not anemic.  Patient is fecal occult negative.  No gross electrolyte derangement, LFT elevations, AKI.  UA is negative for any UTI.  Additionally no hemoglobin concerning for nephrolithiasis.  Lipase negative, doubt pancreatitis.  Imaging Studies ordered: -I ordered imaging studies including right upper quadrant ultrasound -I independently visualized and interpreted imaging which  showed cholelithiasis without evidence of cholecystitis. -I agree with the radiologist interpretation   Medicines ordered and prescription drug management: -I ordered medication including bentyl for pain  -Reevaluation of the patient after these medicines showed that the patient stayed the same -I have reviewed the patients home medicines and have made adjustments as needed  ED Course: Patient vitals remained stable, not febrile or tachycardic.  Based on his work-up I am not concerned about an acute GI bleed.  Possibly having discoloration in her stool from diet for intermittent bleeding from the external hemorrhoid.  Not consistent with cholecystitis given no fever, no leukocytosis.  Cholelithiasis does not explain constant pain, is possible she is having gastritis given frequent anti-inflammatory use despite having quite recently in the last 2 months.  Consider mesenteric ischemia but lack of risk factors makes unlikely, pancreatitis, Pilo, UTI, diverticulitis, AVMs, intra-abdominal abscess, SBO unlikely  Discussed work-up with patient.  Given the location of the pain I think it is reasonable to start the patient on antacid medicine for possible gastritis.  We will also have her follow-up general surgery if the pain persist for evaluation for possible cholecystectomy.  The pain is to the right upper abdomen, doubt this is an acute appendicitis but we did discuss that the pain became localized to the right lower quadrant, she started having fevers, starts vomiting to return back to ED for additional work-up.  At this time I do not think she needs CT abdomen given stable vital signs and lab work-up.  Patient is in agreement with plan  Dispostion: Discharge         Final Clinical Impression(s) / ED Diagnoses Final diagnoses:  Right upper quadrant abdominal pain    Rx / DC Orders ED Discharge Orders          Ordered    famotidine (PEPCID) 40 MG tablet  Daily        08/18/21 1725               Theron Arista, PA-C 08/18/21 2332    Rozelle Logan, DO 08/19/21 1515

## 2021-08-29 DIAGNOSIS — Z1211 Encounter for screening for malignant neoplasm of colon: Secondary | ICD-10-CM | POA: Diagnosis not present

## 2021-08-29 DIAGNOSIS — K8020 Calculus of gallbladder without cholecystitis without obstruction: Secondary | ICD-10-CM | POA: Diagnosis not present

## 2021-09-09 ENCOUNTER — Other Ambulatory Visit: Payer: Self-pay | Admitting: Psychiatry

## 2021-09-09 DIAGNOSIS — F331 Major depressive disorder, recurrent, moderate: Secondary | ICD-10-CM

## 2021-09-18 DIAGNOSIS — K5900 Constipation, unspecified: Secondary | ICD-10-CM | POA: Diagnosis not present

## 2021-09-18 DIAGNOSIS — K625 Hemorrhage of anus and rectum: Secondary | ICD-10-CM | POA: Diagnosis not present

## 2021-09-18 DIAGNOSIS — R1013 Epigastric pain: Secondary | ICD-10-CM | POA: Diagnosis not present

## 2021-09-30 ENCOUNTER — Other Ambulatory Visit: Payer: Self-pay | Admitting: Psychiatry

## 2021-09-30 DIAGNOSIS — F331 Major depressive disorder, recurrent, moderate: Secondary | ICD-10-CM

## 2021-10-03 DIAGNOSIS — K648 Other hemorrhoids: Secondary | ICD-10-CM | POA: Diagnosis not present

## 2021-10-03 DIAGNOSIS — Q438 Other specified congenital malformations of intestine: Secondary | ICD-10-CM | POA: Diagnosis not present

## 2021-10-03 DIAGNOSIS — K644 Residual hemorrhoidal skin tags: Secondary | ICD-10-CM | POA: Diagnosis not present

## 2021-10-03 DIAGNOSIS — K2970 Gastritis, unspecified, without bleeding: Secondary | ICD-10-CM | POA: Diagnosis not present

## 2021-10-03 DIAGNOSIS — Z1211 Encounter for screening for malignant neoplasm of colon: Secondary | ICD-10-CM | POA: Diagnosis not present

## 2021-10-03 DIAGNOSIS — K2930 Chronic superficial gastritis without bleeding: Secondary | ICD-10-CM | POA: Diagnosis not present

## 2021-10-03 DIAGNOSIS — R1013 Epigastric pain: Secondary | ICD-10-CM | POA: Diagnosis not present

## 2021-10-04 DIAGNOSIS — K8020 Calculus of gallbladder without cholecystitis without obstruction: Secondary | ICD-10-CM | POA: Diagnosis not present

## 2021-10-07 ENCOUNTER — Other Ambulatory Visit: Payer: Self-pay | Admitting: Psychiatry

## 2021-10-07 DIAGNOSIS — F411 Generalized anxiety disorder: Secondary | ICD-10-CM

## 2021-10-07 DIAGNOSIS — F4010 Social phobia, unspecified: Secondary | ICD-10-CM

## 2021-10-07 DIAGNOSIS — F331 Major depressive disorder, recurrent, moderate: Secondary | ICD-10-CM

## 2021-10-07 DIAGNOSIS — F4001 Agoraphobia with panic disorder: Secondary | ICD-10-CM

## 2021-10-08 NOTE — Telephone Encounter (Signed)
Has appt with Dr. Jennelle Human on 3/16  ?

## 2021-10-09 ENCOUNTER — Other Ambulatory Visit: Payer: Self-pay | Admitting: General Surgery

## 2021-10-09 DIAGNOSIS — K8010 Calculus of gallbladder with chronic cholecystitis without obstruction: Secondary | ICD-10-CM | POA: Diagnosis not present

## 2021-10-11 ENCOUNTER — Ambulatory Visit: Payer: BC Managed Care – PPO | Admitting: Psychiatry

## 2021-10-27 ENCOUNTER — Other Ambulatory Visit: Payer: Self-pay | Admitting: Psychiatry

## 2021-10-27 DIAGNOSIS — F5105 Insomnia due to other mental disorder: Secondary | ICD-10-CM

## 2021-10-27 DIAGNOSIS — F331 Major depressive disorder, recurrent, moderate: Secondary | ICD-10-CM

## 2021-11-25 ENCOUNTER — Other Ambulatory Visit: Payer: Self-pay | Admitting: Psychiatry

## 2021-11-25 DIAGNOSIS — F331 Major depressive disorder, recurrent, moderate: Secondary | ICD-10-CM

## 2021-11-25 NOTE — Telephone Encounter (Signed)
Please call patient to schedule an appt. Was a no show for March visit.  ?

## 2021-11-27 ENCOUNTER — Other Ambulatory Visit: Payer: Self-pay | Admitting: Psychiatry

## 2021-11-27 DIAGNOSIS — F331 Major depressive disorder, recurrent, moderate: Secondary | ICD-10-CM

## 2021-11-29 ENCOUNTER — Telehealth: Payer: Self-pay | Admitting: Psychiatry

## 2021-11-29 NOTE — Telephone Encounter (Signed)
Rx sent 

## 2021-11-29 NOTE — Telephone Encounter (Signed)
Next visit is 01/15/22. Gisel called requesting a refill on her Lamictal 150 mg called to: ? ? ?[id number] - [place], [place] ? ?Phone:  [phone]  ?Fax:  [phone]  ? ? ? ?

## 2021-12-05 NOTE — Telephone Encounter (Signed)
Pt has an appt 6/20 

## 2021-12-23 ENCOUNTER — Emergency Department (HOSPITAL_BASED_OUTPATIENT_CLINIC_OR_DEPARTMENT_OTHER)
Admission: EM | Admit: 2021-12-23 | Discharge: 2021-12-23 | Disposition: A | Discharge status: Home / Self Care | Payer: BC Managed Care – PPO | Attending: Emergency Medicine | Admitting: Emergency Medicine

## 2021-12-23 ENCOUNTER — Encounter (HOSPITAL_BASED_OUTPATIENT_CLINIC_OR_DEPARTMENT_OTHER): Payer: Self-pay

## 2021-12-23 ENCOUNTER — Other Ambulatory Visit: Payer: Self-pay

## 2021-12-23 ENCOUNTER — Emergency Department (HOSPITAL_BASED_OUTPATIENT_CLINIC_OR_DEPARTMENT_OTHER): Payer: BC Managed Care – PPO | Admitting: Radiology

## 2021-12-23 DIAGNOSIS — R42 Dizziness and giddiness: Secondary | ICD-10-CM | POA: Diagnosis not present

## 2021-12-23 DIAGNOSIS — R001 Bradycardia, unspecified: Secondary | ICD-10-CM | POA: Diagnosis not present

## 2021-12-23 DIAGNOSIS — R002 Palpitations: Secondary | ICD-10-CM | POA: Insufficient documentation

## 2021-12-23 DIAGNOSIS — R5383 Other fatigue: Secondary | ICD-10-CM | POA: Insufficient documentation

## 2021-12-23 LAB — BASIC METABOLIC PANEL
Anion gap: 9 (ref 5–15)
BUN: 17 mg/dL (ref 6–20)
CO2: 23 mmol/L (ref 22–32)
Calcium: 9.7 mg/dL (ref 8.9–10.3)
Chloride: 108 mmol/L (ref 98–111)
Creatinine, Ser: 0.86 mg/dL (ref 0.44–1.00)
GFR, Estimated: >60 mL/min (ref >60)
Glucose, Bld: 101 mg/dL — ABNORMAL HIGH (ref 70–99)
Potassium: 4 mmol/L (ref 3.5–5.1)
Sodium: 140 mmol/L (ref 135–145)

## 2021-12-23 LAB — CBC
HCT: 42.2 % (ref 36.0–46.0)
Hemoglobin: 13.7 g/dL (ref 12.0–15.0)
MCH: 27.2 pg (ref 26.0–34.0)
MCHC: 32.5 g/dL (ref 30.0–36.0)
MCV: 83.7 fL (ref 80.0–100.0)
Platelets: 257 10*3/uL (ref 150–400)
RBC: 5.04 MIL/uL (ref 3.87–5.11)
RDW: 14.2 % (ref 11.5–15.5)
WBC: 6.4 10*3/uL (ref 4.0–10.5)
nRBC: 0 % (ref 0.0–0.2)

## 2021-12-23 LAB — LITHIUM LEVEL: Lithium Lvl: 0.43 mmol/L — ABNORMAL LOW (ref 0.60–1.20)

## 2021-12-23 NOTE — Discharge Instructions (Signed)
As we discussed we do not see anything worrisome on your work-up today, you have been in normal heart rhythm for the entirety of your duration.  I would continue take your medications as prescribed, do not alter your lithium prescription at this time despite being slightly low on your blood levels.  Take the day to get some rest, drink plenty of fluids.  I have attached the contact information for cardiologist, if you have any repeat of these episodes without persistence, or have concern about the events that you experienced earlier today I recommend that you follow-up for possible Holter monitor for further evaluation.  If you begin to have persistent palpitations, especially associate with shortness of breath, chest pain please return to the emergency department.

## 2021-12-23 NOTE — ED Triage Notes (Signed)
She reports feeling "fatigued" this morning. She then went to church without incident. While driving home from church she experienced dizziness coupled with palpitations "which I've never had before". She is ambulatory and in no distress.

## 2021-12-23 NOTE — ED Notes (Signed)
Pt resting with eyes closed. Husband at bedside. Call bell in reach.

## 2021-12-23 NOTE — ED Notes (Signed)
Dc instructions reviewed with pt. Pt to follow up with cardiology next week. No questions or concerns at this time.

## 2021-12-23 NOTE — ED Provider Notes (Signed)
[facility] EMERGENCY DEPT Provider Note   CSN: [id number] Arrival date & time: 12/23/21  1306     History  Chief Complaint  Patient presents with   Palpitations    [name] is a 51 y.o. female with a past medical history significant for depression, anxiety, Hashimoto's disease who presents with concern for fatigue, heart palpitations this morning.  Patient reports that she experienced some dizziness, felt a fluttering in the chest sensation that she is never had before it lasted for around 2 to 3 minutes.  Sensation then spontaneously resolved.  At this point she reports that she still feels somewhat fatigued but is not having the same dizziness or heart palpitations sensation.  She does take lithium, thiamine, Lamictal.  She reports that she just got over a flulike illness last week around Thursday.  She denies any chest pain, shortness of breath, abdominal pain, headache, confusion, numbness, tingling, or lightheadedness sensation at this point.   Palpitations     Home Medications Prior to Admission medications   Medication Sig Start Date End Date Taking? Authorizing Provider  Acetylcysteine 600 MG CAPS Take 600 mg by mouth 2 (two) times daily. Patient not taking: No sig reported    [provider]  b complex vitamins tablet Take 1 tablet by mouth daily.    [provider]  Cholecalciferol (D3-1000) 25 MCG (1000 UT) capsule Take 1,000 Units by mouth daily.    [provider]  Coenzyme Q10 (CO Q 10) 100 MG CAPS Take 200 mg by mouth daily. Patient not taking: No sig reported    [provider]  Docusate Calcium (STOOL SOFTENER PO) Take by mouth 3 (three) times daily.    [provider]  famotidine (PEPCID) 40 MG tablet Take 1 tablet (40 mg total) by mouth daily. 08/18/21   Theron Arista, PA-C  Galcanezumab-gnlm (EMGALITY) 120 MG/ML SOAJ Inject 120 mg into the skin every 28 (twenty-eight) days. Patient not taking:  Reported on 06/13/2021 09/05/20   Drema Dallas, DO  lamoTRIgine (LAMICTAL) 150 MG tablet TAKE ONE TABLET BY MOUTH TWICE A DAY 11/29/21   Cottle, Steva Ready., MD  levothyroxine (SYNTHROID, LEVOTHROID) 125 MCG tablet Take 125 mcg by mouth daily before breakfast.     [provider]  lithium carbonate (LITHOBID) 300 MG CR tablet TAKE TWO TABLETS BY MOUTH DAILY 11/28/21   Cottle, Steva Ready., MD  Magnesium 200 MG TABS Take by mouth daily.    [provider]  Multiple Vitamins-Minerals (WOMENS MULTIVITAMIN PLUS PO) Take 1 tablet by mouth daily.     [provider]  PARoxetine (PAXIL) 30 MG tablet TAKE ONE TABLET BY MOUTH DAILY 10/08/21   Cottle, Steva Ready., MD  Polyethylene Glycol 3350 (MIRALAX PO) Take by mouth daily.    [provider]  QUEtiapine (SEROQUEL) 100 MG tablet TAKE ONE TABLET BY MOUTH EVERY NIGHT AT BEDTIME 06/28/21   Cottle, Steva Ready., MD  QUEtiapine (SEROQUEL) 50 MG tablet TAKE 1-2 TABLETSS BY MOUTH AT BEDTIME 10/30/21   Cottle, Steva Ready., MD  sucralfate (CARAFATE) 1 g tablet Take 1 tablet (1 g total) by mouth 4 (four) times daily -  with meals and at bedtime for 14 days. 09/10/20 09/24/20  Placido Sou, PA-C  SUMAtriptan (TOSYMRA) 10 MG/ACT SOLN Place 10 mg into the nose as needed (May repeat in 1 hour.  Maximum 3 sprays in 24 hours). 09/05/20   Drema Dallas, DO  verapamil (CALAN-SR) 240  MG CR tablet Take 240 mg by mouth daily.    [provider]      Allergies    Perphenazine, Imipramine, Penicillins, Albuterol, Other, and Codeine    Review of Systems   Review of Systems  Constitutional:  Positive for fatigue.  Cardiovascular:  Positive for palpitations.  All other systems reviewed and are negative.  Physical Exam Updated Vital Signs BP 139/74   Pulse (!) 56   Temp 98.1 F (36.7 C) (Oral)   Resp 15   LMP  (LMP Unknown)   SpO2 100%  Physical Exam Vitals and nursing note reviewed.  Constitutional:      General: She is not in  acute distress.    Appearance: Normal appearance.  HENT:     Head: Normocephalic and atraumatic.  Eyes:     General:        Right eye: No discharge.        Left eye: No discharge.  Cardiovascular:     Rate and Rhythm: Regular rhythm.     Heart sounds: No murmur heard.   No friction rub. No gallop.     Comments: Minimal bradycardia, patient in the mid 50s throughout her evaluation Pulmonary:     Effort: Pulmonary effort is normal.     Breath sounds: Normal breath sounds.  Abdominal:     General: Bowel sounds are normal.     Palpations: Abdomen is soft.  Skin:    General: Skin is warm and dry.     Capillary Refill: Capillary refill takes less than 2 seconds.  Neurological:     Mental Status: She is alert and oriented to person, place, and time.  Psychiatric:        Mood and Affect: Mood normal.        Behavior: Behavior normal.    ED Results / Procedures / Treatments   Labs (all labs ordered are listed, but only abnormal results are displayed) Labs Reviewed  BASIC METABOLIC PANEL - Abnormal; Notable for the following components:      Result Value   Glucose, Bld 101 (*)    All other components within normal limits  LITHIUM LEVEL - Abnormal; Notable for the following components:   Lithium Lvl 0.43 (*)    All other components within normal limits  CBC    EKG EKG Interpretation  Date/Time:  Sunday Dec 23 2021 13:13:32 EDT Ventricular Rate:  61 PR Interval:  168 QRS Duration: 92 QT Interval:  441 QTC Calculation: 445 R Axis:   94 Text Interpretation: Sinus rhythm Borderline right axis deviation no wpw, prolonged qt or brugada No significant change since last tracing Confirmed by Melene PlanFloyd, Dan [phone]([id number]) on 12/23/2021 2:50:59 PM  Radiology No results found.  Procedures Procedures    Medications Ordered in ED Medications - No data to display  ED Course/ Medical Decision Making/ A&P                           Medical Decision Making Amount and/or Complexity of Data  Reviewed Labs: ordered.   This patient is a 51 y.o. female who presents to the ED for concern of heart palpitations, fatigue, this involves an extensive number of treatment options, and is a complaint that carries with it a high risk of complications and morbidity. The emergent differential diagnosis prior to evaluation includes, but is not limited to, brief episode of SVT, paroxysmal A-fib, near syncope, cardiogenic syncope, Electrolyte abnormality, lithium or thyroid  abnormality  This is not an exhaustive differential.   Past Medical History / Co-morbidities / Social History: depression, anxiety, Hashimoto's disease  Additional history: Chart reviewed. Pertinent results include: Reviewed lab work, imaging from recent emergency department visits, outpatient endocrinology and feeling medicine visits  Physical Exam: Physical exam performed. The pertinent findings include: Overall well-appearing patient with no signs of pallor, no acute distress, overall normal heart rate and rhythm with minimal bradycardia, heart rate is in the mid 50s  Lab Tests: I ordered, and personally interpreted labs.  The pertinent results include: Overall unremarkable BMP, CBC.  Lithium level is 0.47, normal range 0.6-1.2. Encouraged follow up with PCP. No signs of acute toxicity at this time.   Cardiac Monitoring:  The patient was maintained on a cardiac monitor.  My attending physician Dr. Adela Lank viewed and interpreted the cardiac monitored which showed an underlying rhythm of: Sinus bradycardia, no QT prolongation or notable arrhythmia noted.  Patient without significant bradycardia, has been in the mid 50s to high 50s since arrival.   Disposition: After consideration of the diagnostic results and the patients response to treatment, I feel that patient's clinical condition is consistent with possible near syncope or near cardiogenic syncope, brief unexplained arrhythmia versus other.  She is showing no signs of  arrhythmia with greater than 3 hours on cardiac monitor at the emergency department today.  She has had no return of symptoms.  She has no evidence of electrolyte abnormality, QT prolongation.  Mildly subtherapeutic lithium, patient to follow-up with PCP.  Encourage follow-up with cardiologist for further evaluation possible Holter monitor if patient has any concern about returning symptoms.  I discussed this case with my attending physician Dr. Adela Lank who cosigned this note including patient's presenting symptoms, physical exam, and planned diagnostics and interventions. Attending physician stated agreement with plan or made changes to plan which were implemented.    Final Clinical Impression(s) / ED Diagnoses Final diagnoses:  Heart palpitations    Rx / DC Orders ED Discharge Orders     None         Olene Floss, PA-C 12/23/21 1637    Melene Plan, DO 12/24/21 [phone]

## 2022-01-03 ENCOUNTER — Other Ambulatory Visit: Payer: Self-pay | Admitting: Psychiatry

## 2022-01-03 DIAGNOSIS — F411 Generalized anxiety disorder: Secondary | ICD-10-CM

## 2022-01-03 DIAGNOSIS — F4010 Social phobia, unspecified: Secondary | ICD-10-CM

## 2022-01-03 DIAGNOSIS — F331 Major depressive disorder, recurrent, moderate: Secondary | ICD-10-CM

## 2022-01-03 DIAGNOSIS — F4001 Agoraphobia with panic disorder: Secondary | ICD-10-CM

## 2022-01-15 ENCOUNTER — Encounter: Payer: Self-pay | Admitting: Psychiatry

## 2022-01-15 ENCOUNTER — Ambulatory Visit (INDEPENDENT_AMBULATORY_CARE_PROVIDER_SITE_OTHER): Payer: BC Managed Care – PPO | Admitting: Psychiatry

## 2022-01-15 DIAGNOSIS — F5105 Insomnia due to other mental disorder: Secondary | ICD-10-CM

## 2022-01-15 DIAGNOSIS — R7989 Other specified abnormal findings of blood chemistry: Secondary | ICD-10-CM

## 2022-01-15 DIAGNOSIS — F411 Generalized anxiety disorder: Secondary | ICD-10-CM | POA: Diagnosis not present

## 2022-01-15 DIAGNOSIS — F4010 Social phobia, unspecified: Secondary | ICD-10-CM | POA: Diagnosis not present

## 2022-01-15 DIAGNOSIS — F331 Major depressive disorder, recurrent, moderate: Secondary | ICD-10-CM | POA: Diagnosis not present

## 2022-01-15 DIAGNOSIS — R413 Other amnesia: Secondary | ICD-10-CM

## 2022-01-15 DIAGNOSIS — F4001 Agoraphobia with panic disorder: Secondary | ICD-10-CM | POA: Diagnosis not present

## 2022-01-15 DIAGNOSIS — F900 Attention-deficit hyperactivity disorder, predominantly inattentive type: Secondary | ICD-10-CM

## 2022-01-15 DIAGNOSIS — Z79899 Other long term (current) drug therapy: Secondary | ICD-10-CM

## 2022-01-15 MED ORDER — QUETIAPINE FUMARATE 100 MG PO TABS: 100.0000 mg | ORAL_TABLET | Freq: Every day | ORAL | 1 refills | Status: DC

## 2022-01-15 MED ORDER — LITHIUM CARBONATE ER 300 MG PO TBCR: 600.0000 mg | EXTENDED_RELEASE_TABLET | Freq: Every day | ORAL | 1 refills | Status: DC

## 2022-01-15 MED ORDER — LAMOTRIGINE 150 MG PO TABS: 150.0000 mg | ORAL_TABLET | Freq: Two times a day (BID) | ORAL | 1 refills | Status: DC

## 2022-01-15 MED ORDER — PAROXETINE HCL 30 MG PO TABS: 30.0000 mg | ORAL_TABLET | Freq: Every day | ORAL | 1 refills | Status: DC

## 2022-01-15 MED ORDER — ACETYLCYSTEINE 600 MG PO CAPS: 600.0000 mg | ORAL_CAPSULE | Freq: Two times a day (BID) | ORAL | 0 refills | Status: AC

## 2022-01-15 NOTE — Progress Notes (Signed)
[name] [id number] 12/21/1970 51 y.o.  Subjective:   Patient ID:  [name] is a 51 y.o. (DOB 19-Jul-1971) female.  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  At appt Sep 02, 2018. Raised Concerta to 27 mg but then she had anxiety and couple panic and so it was reduced to 18 mg and anxiety resolved.  appointment May 2020.  No meds were changed.  November 2020 appointment with the following noted: Out of the stimulant.  Didn't pursue it as not critical..  Overall OK considering social matters. Concerned about her memory.  Loses track in conversation.  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 Overall pretty good mood.  Still tired in the am but less so with IR Seroquel lower dose..  Some anxiety with Covid.  Wasn't following thoughts to dark and scary places.  Loves being at home so ok. Getting things done and that feels really good.  Put off home schooling until July.  Better mood. Bc tremor she reduced lithium from 1200 to 900 on April 15 and the tremor is better not gone and feels the same.  No SI since here. Still has to nap and rest at times with Concerta but it seemed to help, but hard to explain.  Clarity better.  Easier to make decisions.  Don't feel as daft.  Still hard listening to sermons, drift off all my life.  Is doing better with home schooling.  Still night eating.  Sleep 8-10 hours. Plan restart Concerta and check labs for cognition.  01/20/2020 appointment with the following noted: Patient should have been seen since November before this time. Did not do well with Concerta 18  bc too anxious. Perimenopausal.  Moody.  Some irritability. Struggling a lot with migraine but seems to be getting better.  Gets emotionally overwhelmed but not real  dark moods.  Some sadness. Productivity is better lately. 1 panic since here and thought was having MI.  Anxiety is better now.  Tax season hard. Sleep great with routine.  Fidgety all the time is somewhat embarrassing.  No discomfort. Started diet and less nighteating now over the last couple of weeks.  Plan: For memory complaints check B12 and folate and lamotrigine level.  Also need to check lithium and BMP.  PCP checks thyroid.  10/11/20 appt noted: Pretty good.  Depression under control.  Anxiety is OK unless too much caffeine.   Patient reports stable mood and denies depressed or irritable moods.  Patient denies any recent difficulty with anxiety.  Patient denies difficulty with sleep initiation or maintenance. Denies appetite disturbance.  Patient reports that energy and motivation have been good.  Patient denies any difficulty with concentration.  Patient denies any suicidal ideation. Has a little tremor.   Saw benefit from NAC for cognition but ran out bc difficulty getting it. Still really hungry at night after Seroquel.  Asks about reduction. Plan: Trial propranolol 20-40 mg prn tremor  06/13/21 appt noted: Never tried propranolol. Decreased lithium to 2 tablets at night for 5-6 mos to get rid of tremor and ready to decrease stuff. Wants to decrease more.  Tried to reduce quetiapine from 100 to 50 and couldn't sleep. Sleep 8 hours. Depression under control.  Mild anxiety panic flutters before Scott's  surgery and channeled it into cleaning.Lorin Picket doing well now after surgery for diverticulitis. Dr. Richarda Overlie. Plan: OK continue lower dose lithium 600 mg daily but not lower. Check lithium labls Continue quetiapine 50 mg nightly Ok trial reduction in lamotrigine gradually to 150 mg daily over a month. Continue paroxetine 30 mg daily.  01/15/22 appt noted: Feels better after cholecystectomy. Mood is good. Tried reducing lamotrigine to 150 mg daily but more depressed in  just a few days and increased it back to 2 daily. Compliant and no sig SE. Botox helped migraine.  More productive and reliable. Overall depression and anxiety are under control. Had heart flip flop issue a couple of weeks ago without trigger. Also dizzy and went to ER, neg workup.  Out of the blue.      Past Psychiatric Medication Trials:  Concerta 18 SE, modafinil 200 side effect, Ritalin, Loxitane 60, Seroquel XR 600, Abilify, Rexulti side effects,  risperidone lamotrigine 300,  lithium, Depakote side effects, buspirone 30 twice daily, propranolol, gabapentin, topiramate, , imipramine, venlafaxine, sertraline, Lexapro 20 nr, Wellbutrin,  selegiline, duloxetine side effects, temazepam Deplin,   Relpax depression  Review of Systems:  Negative for weakness, + weight gain, trmor, connstipation , reflux resolved, some HA better with chiropracter, hearing is getting worse. No CP, cough  Medications: I have reviewed the patient's current medications.  Current Outpatient Medications  Medication Sig Dispense Refill   b complex vitamins tablet Take 1 tablet by mouth daily.     Cholecalciferol (D3-1000) 25 MCG (1000 UT) capsule Take 1,000 Units by mouth daily.     Docusate Calcium (STOOL SOFTENER PO) Take by mouth 3 (three) times daily.     famotidine (PEPCID) 40 MG tablet Take 1 tablet (40 mg total) by mouth daily. 15 tablet 0   levothyroxine (SYNTHROID, LEVOTHROID) 125 MCG tablet Take 125 mcg by mouth daily before breakfast.      Magnesium 200 MG TABS Take by mouth daily.     Multiple Vitamins-Minerals (WOMENS MULTIVITAMIN PLUS PO) Take 1 tablet by mouth daily.      Polyethylene Glycol 3350 (MIRALAX PO) Take by mouth daily.     SUMAtriptan (TOSYMRA) 10 MG/ACT SOLN Place 10 mg into the nose as needed (May repeat in 1 hour.  Maximum 3 sprays in 24 hours). 6 each 5   verapamil (CALAN-SR) 240 MG CR tablet Take 240 mg by mouth daily.     Acetylcysteine 600 MG CAPS Take 1 capsule (600 mg total)  by mouth 2 (two) times daily. 270 capsule 0   lamoTRIgine (LAMICTAL) 150 MG tablet Take 1 tablet (150 mg total) by mouth 2 (two) times daily. 180 tablet 1   lithium carbonate (LITHOBID) 300 MG CR tablet Take 2 tablets (600 mg total) by mouth daily. 180 tablet 1   PARoxetine (PAXIL) 30 MG tablet Take 1 tablet (30 mg total) by mouth daily. 90 tablet 1   QUEtiapine (SEROQUEL) 100 MG tablet Take 1 tablet (100 mg total) by mouth at bedtime. 90 tablet 1   sucralfate (CARAFATE) 1 g tablet Take 1 tablet (1 g total) by mouth 4 (four) times daily -  with meals and at bedtime for 14 days. 56 tablet 0   No current facility-administered medications for this visit.    Medication Side Effects: Other: night eating may be related. , tremor  Allergies:  Allergies  Allergen Reactions   Perphenazine Anaphylaxis and Swelling    Throat swelling and facial muscle contractions  Imipramine Other (See Comments)    Can't remember adverse effect   Penicillins Rash   Albuterol    Other     Phenegran   Codeine Rash    Past Medical History:  Diagnosis Date   Depression    Hashimoto's disease    Kidney stones    Migraine     Family History  Problem Relation Age of Onset   Depression Mother    Migraines Mother    Obesity Mother    Rheum arthritis Sister    Migraines Sister    Hypertension Sister     Social History   Socioeconomic History   Marital status: Married    Spouse name: Acupuncturist   Number of children: 4   Years of education: 14   Highest education level: Some college, no degree  Occupational History   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 She lives with her husband and 4 children in a one level home. She drinks one glass of tea (8-16 oz) a day. She does not exercise. Some college.   Social Determinants of Health   Financial Resource Strain: Not on file  Food Insecurity: Not on file  Transportation Needs: Not on file  Physical Activity: Not on file  Stress: Not on file  Social Connections: Not on file  Intimate Partner Violence: Not on file    Past Medical History, Surgical history, Social history, and Family history were reviewed and updated as appropriate.   Please see review of systems for further details on the patient's review from today.   Objective:   Physical Exam:  LMP  (LMP Unknown)   Physical Exam Constitutional:      General: She is not in acute distress.    Appearance: She is well-developed.  Musculoskeletal:        General: No deformity.  Neurological:     Mental Status: She is alert and oriented to person, place, and time.     Motor: No tremor.     Coordination: Coordination normal.     Gait: Gait normal.  Psychiatric:        Attention and Perception: She is attentive.        Mood and Affect: Mood is not anxious or depressed. Affect is not labile, blunt, angry or inappropriate.        Speech: Speech normal.        Behavior: Behavior normal.        Thought Content: Thought content is not paranoid or delusional. Thought content does not include homicidal or suicidal ideation. Thought content does not include suicidal plan.        Cognition and Memory: Cognition normal.        Judgment: Judgment normal.     Comments: Insight and general self-awareness is mildly  impaired chronically.     Lab Review:     Component Value Date/Time   NA 140 12/23/2021 1325   K 4.0 12/23/2021 1325   CL 108 12/23/2021 1325   CO2 23 12/23/2021 1325  GLUCOSE 101 (H) 12/23/2021 1325   BUN 17 12/23/2021 1325   CREATININE 0.86 12/23/2021 1325   CREATININE  0.85 05/10/2020 0940   CALCIUM 9.7 12/23/2021 1325   PROT 7.9 08/18/2021 1300   ALBUMIN 4.5 08/18/2021 1300   AST 27 08/18/2021 1300   ALT 35 08/18/2021 1300   ALKPHOS 90 08/18/2021 1300   BILITOT 0.3 08/18/2021 1300   GFRNONAA >60 12/23/2021 1325   GFRAA >90 08/23/2013 1600       Component Value Date/Time   WBC 6.4 12/23/2021 1325   RBC 5.04 12/23/2021 1325   HGB 13.7 12/23/2021 1325   HCT 42.2 12/23/2021 1325   PLT 257 12/23/2021 1325   MCV 83.7 12/23/2021 1325   MCH 27.2 12/23/2021 1325   MCHC 32.5 12/23/2021 1325   RDW 14.2 12/23/2021 1325   LYMPHSABS 2.1 01/17/2011 1812   MONOABS 0.7 01/17/2011 1812   EOSABS 0.1 01/17/2011 1812   BASOSABS 0.0 01/17/2011 1812    Lithium Lvl  Date Value Ref Range Status  12/23/2021 0.43 (L) 0.60 - 1.20 mmol/L Final    Comment:    Performed at [facility], [address] [place], [place] [address]  12/23/21 lithium level 0.43 afternoon on 600 mg HS    No results found for: "PHENYTOIN", "PHENOBARB", "VALPROATE", "CBMZ"   .res Assessment: Plan:    Major depressive disorder, recurrent episode, moderate (HCC) - Plan: lamoTRIgine (LAMICTAL) 150 MG tablet, lithium carbonate (LITHOBID) 300 MG CR tablet, PARoxetine (PAXIL) 30 MG tablet, QUEtiapine (SEROQUEL) 100 MG tablet  Panic disorder with agoraphobia - Plan: PARoxetine (PAXIL) 30 MG tablet  Generalized anxiety disorder - Plan: PARoxetine (PAXIL) 30 MG tablet, QUEtiapine (SEROQUEL) 100 MG tablet  Social anxiety disorder - Plan: PARoxetine (PAXIL) 30 MG tablet  Attention deficit hyperactivity disorder (ADHD), predominantly inattentive type - Plan: Acetylcysteine 600 MG CAPS  Insomnia due to mental condition - Plan: QUEtiapine (SEROQUEL) 100 MG tablet  Lithium use  Low vitamin D level  Memory change   Hx severe TRD with Suicide attempt.  Insight and general self-awareness is moderately impaired chronically complicating and delaying treatment.  Also she has not kept  appointments as frequently as recommended.  She is not having panic attacks and overall depression is reasonably managed though her motivation and energy are low.  She is somewhat chronically dysfunctional to some degree. Disc her questions about memory effects of meds.  Consider reduction of lamotrigine if laboratory tests do not show reasons for memory problems..  Couldn't tolerate Concerta DT anxiety.  She still night eating which might possibly be related to quetiapine.  OK reduce to 50 mg HS .  May taper lower bc no mood effects that low. But she can't sleep if it gets too low. Discussed potential metabolic side effects associated with atypical antipsychotics, as well as potential risk for movement side effects. Advised pt to contact office if movement side effects occur.   Counseled patient regarding potential benefits, risks, and side effects of lithium to include potential risk of lithium affecting thyroid and renal function.  Discussed need for periodic lab monitoring to determine drug level and to assess for potential adverse effects.  Counseled patient regarding signs and symptoms of lithium toxicity and advised that they notify office immediately or seek urgent medical attention if experiencing these signs and symptoms.  Patient advised to contact office with any questions or concerns. Lithium level good October. OK continue lower dose lithium 600 mg daily but not lower. Checked lithium labls  12/23/21 lithium level 0.43 afternoon on 600 mg HS   Worse with reduction in lamotrigine gradually to 150 mg daily and better on BID Continue paroxetine 30 mg daily.  Better with NAC 1800 mg daily.  For memory complaints continue NAC bc it helped..    Constipation managed  Option off label Aricept.  Don't change meds on your own DT severe illness in the past.  This appt was 30 mins.  FU 4 mos  Meredith Staggers, MD, DFAPA   Please see After Visit Summary for patient specific  instructions.  No future appointments.   No orders of the defined types were placed in this encounter.      -------------------------------

## 2022-04-04 DIAGNOSIS — T148XXA Other injury of unspecified body region, initial encounter: Secondary | ICD-10-CM | POA: Diagnosis not present

## 2022-05-07 DIAGNOSIS — R002 Palpitations: Secondary | ICD-10-CM | POA: Diagnosis not present

## 2022-05-15 ENCOUNTER — Encounter: Payer: Self-pay | Admitting: Psychiatry

## 2022-05-15 ENCOUNTER — Ambulatory Visit (INDEPENDENT_AMBULATORY_CARE_PROVIDER_SITE_OTHER): Payer: BC Managed Care – PPO | Admitting: Psychiatry

## 2022-05-15 DIAGNOSIS — R413 Other amnesia: Secondary | ICD-10-CM

## 2022-05-15 DIAGNOSIS — F5105 Insomnia due to other mental disorder: Secondary | ICD-10-CM

## 2022-05-15 DIAGNOSIS — F4010 Social phobia, unspecified: Secondary | ICD-10-CM | POA: Diagnosis not present

## 2022-05-15 DIAGNOSIS — F411 Generalized anxiety disorder: Secondary | ICD-10-CM | POA: Diagnosis not present

## 2022-05-15 DIAGNOSIS — F4001 Agoraphobia with panic disorder: Secondary | ICD-10-CM

## 2022-05-15 DIAGNOSIS — F331 Major depressive disorder, recurrent, moderate: Secondary | ICD-10-CM

## 2022-05-15 DIAGNOSIS — R7989 Other specified abnormal findings of blood chemistry: Secondary | ICD-10-CM

## 2022-05-15 DIAGNOSIS — Z79899 Other long term (current) drug therapy: Secondary | ICD-10-CM

## 2022-05-15 DIAGNOSIS — F900 Attention-deficit hyperactivity disorder, predominantly inattentive type: Secondary | ICD-10-CM

## 2022-05-15 NOTE — Patient Instructions (Signed)
Check lithium test

## 2022-05-15 NOTE — Progress Notes (Signed)
[name] [id number] [birth year] 51 y.o.  Subjective:   Patient ID:  [name] is a 51 y.o. (DOB 1971-07-03) female.  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  At appt Sep 02, 2018. Raised Concerta to 27 mg but then she had anxiety and couple panic and so it was reduced to 18 mg and anxiety resolved.  appointment May 2020.  No meds were changed.  November 2020 appointment with the following noted: Out of the stimulant.  Didn't pursue it as not critical..  Overall OK considering social matters. Concerned about her memory.  Loses track in conversation.  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 Overall pretty good mood.  Still tired in the am but less so with IR Seroquel lower dose..  Some anxiety with Covid.  Wasn't following thoughts to dark and scary places.  Loves being at home so ok. Getting things done and that feels really good.  Put off home schooling until July.  Better mood. Bc tremor she reduced lithium from 1200 to 900 on April 15 and the tremor is better not gone and feels the same.  No SI since here. Still has to nap and rest at times with Concerta but it seemed to help, but hard to explain.  Clarity better.  Easier to make decisions.  Don't feel as daft.  Still hard listening to sermons, drift off all my life.  Is doing better with home schooling.  Still night eating.  Sleep 8-10 hours. Plan restart Concerta and check labs for cognition.  01/20/2020 appointment with the following noted: Patient should have been seen since November before this time. Did not do well with Concerta 18  bc too anxious. Perimenopausal.  Moody.  Some irritability. Struggling a lot with migraine but seems to be getting better.  Gets emotionally overwhelmed but not real dark moods.  Some  sadness. Productivity is better lately. 1 panic since here and thought was having MI.  Anxiety is better now.  Tax season hard. Sleep great with routine.  Fidgety all the time is somewhat embarrassing.  No discomfort. Started diet and less nighteating now over the last couple of weeks.  Plan: For memory complaints check B12 and folate and lamotrigine level.  Also need to check lithium and BMP.  PCP checks thyroid.  10/11/20 appt noted: Pretty good.  Depression under control.  Anxiety is OK unless too much caffeine.   Patient reports stable mood and denies depressed or irritable moods.  Patient denies any recent difficulty with anxiety.  Patient denies difficulty with sleep initiation or maintenance. Denies appetite disturbance.  Patient reports that energy and motivation have been good.  Patient denies any difficulty with concentration.  Patient denies any suicidal ideation. Has a little tremor.   Saw benefit from Neopit for cognition but ran out bc difficulty getting it. Still really hungry at night after Seroquel.  Asks about reduction. Plan: Trial propranolol 20-40 mg prn tremor  06/13/21 appt noted: Never tried propranolol. Decreased lithium to 2 tablets at night for 5-6 mos to get rid of tremor and ready to decrease stuff. Wants to decrease more.  Tried to reduce quetiapine from 100 to 50 and couldn't sleep. Sleep 8 hours. Depression under control.  Mild anxiety panic flutters before Scott's surgery and channeled it  into cleaning.Lorin Picket doing well now after surgery for diverticulitis. Dr. Richarda Overlie. Plan: OK continue lower dose lithium 600 mg daily but not lower. Check lithium labls Continue quetiapine 50 mg nightly Ok trial reduction in lamotrigine gradually to 150 mg daily over a month. Continue paroxetine 30 mg daily.  01/15/22 appt noted: Feels better after cholecystectomy. Mood is good. Tried reducing lamotrigine to 150 mg daily but more depressed in just a few days and  increased it back to 2 daily. Compliant and no sig SE. Botox helped migraine.  More productive and reliable. Overall depression and anxiety are under control. Had heart flip flop issue a couple of weeks ago without trigger. Also dizzy and went to ER, neg workup.  Out of the blue.  05/15/22 appt noted:   Hasn't gotten lithium level. Heart palpitations again.  Will see cardiologist this month.  It sneaks up on her and can last 90 mins.  Not caused by anxiety. Taking verapamil for htn. No panic.   Increased stress caretaking mother.   Mo won't do much. That has been a problem in the past.  D Sarah in [place].  Pastor left.  Maybe internalizing stress. Trouble getting out of the house or contacting friends.  Not sure why.  Don't want to do anything.  "I guess so", re: overwhelmed. Sleep is good. No SI since here.  Past Psychiatric Medication Trials:  Concerta 18 SE anxiety, modafinil 200 side effect, Ritalin, Loxitane 60, Seroquel XR 600, Abilify, Rexulti side effects,  risperidone lamotrigine 300,  lithium, Depakote side effects, buspirone 30 twice daily, propranolol, gabapentin, topiramate, , imipramine, venlafaxine, sertraline, Lexapro 20 nr, Wellbutrin,  selegiline, duloxetine side effects,  paroxetine 30 temazepam Deplin,   Relpax depression  Review of Systems:  Negative for weakness, + weight gain, trmor, connstipation , reflux resolved, some HA better with chiropracter, hearing is getting worse. No CP, cough Increased palpitations  Medications: I have reviewed the patient's current medications.  Current Outpatient Medications  Medication Sig Dispense Refill   Acetylcysteine 600 MG CAPS Take 1 capsule (600 mg total) by mouth 2 (two) times daily. 270 capsule 0   b complex vitamins tablet Take 1 tablet by mouth daily.     Cholecalciferol (D3-1000) 25 MCG (1000 UT) capsule Take 1,000 Units by mouth daily.     Docusate Calcium (STOOL SOFTENER PO) Take by mouth 3 (three) times daily.      famotidine (PEPCID) 40 MG tablet Take 1 tablet (40 mg total) by mouth daily. 15 tablet 0   lamoTRIgine (LAMICTAL) 150 MG tablet Take 1 tablet (150 mg total) by mouth 2 (two) times daily. 180 tablet 1   levothyroxine (SYNTHROID, LEVOTHROID) 125 MCG tablet Take 125 mcg by mouth daily before breakfast.      lithium carbonate (LITHOBID) 300 MG CR tablet Take 2 tablets (600 mg total) by mouth daily. 180 tablet 1   Magnesium 200 MG TABS Take by mouth daily.     Multiple Vitamins-Minerals (WOMENS MULTIVITAMIN PLUS PO) Take 1 tablet by mouth daily.      PARoxetine (PAXIL) 30 MG tablet Take 1 tablet (30 mg total) by mouth daily. 90 tablet 1   Polyethylene Glycol 3350 (MIRALAX PO) Take by mouth daily.     QUEtiapine (SEROQUEL) 100 MG tablet Take 1 tablet (100 mg total) by mouth at bedtime. 90 tablet 1   SUMAtriptan (TOSYMRA) 10 MG/ACT SOLN Place 10 mg into the nose as needed (May repeat in 1 hour.  Maximum 3 sprays  in 24 hours). 6 each 5   verapamil (CALAN-SR) 240 MG CR tablet Take 240 mg by mouth daily.     sucralfate (CARAFATE) 1 g tablet Take 1 tablet (1 g total) by mouth 4 (four) times daily -  with meals and at bedtime for 14 days. 56 tablet 0   No current facility-administered medications for this visit.    Medication Side Effects: Other: night eating may be related. , tremor  Allergies:  Allergies  Allergen Reactions   Perphenazine Anaphylaxis and Swelling    Throat swelling and facial muscle contractions     Imipramine Other (See Comments)    Can't remember adverse effect   Penicillins Rash   Albuterol    Other     Phenegran   Codeine Rash    Past Medical History:  Diagnosis Date   Depression    Hashimoto's disease    Kidney stones    Migraine     Family History  Problem Relation Age of Onset   Depression Mother    Migraines Mother    Obesity Mother    Rheum arthritis Sister    Migraines Sister    Hypertension Sister     Social History   Socioeconomic History    Marital status: Married    Spouse name: Event organiser   Number of children: 4   Years of education: 14   Highest education level: Some college, no degree  Occupational History   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 She lives with her husband and 4 children in a one level home. She drinks one glass of tea (8-16 oz) a day. She does not exercise. Some college.   Social Determinants of Health   Financial Resource Strain: Not on file  Food Insecurity: Not on file  Transportation Needs: Not on file  Physical Activity: Not on file  Stress: Not on file  Social Connections: Not on file  Intimate Partner Violence: Not on file    Past Medical History, Surgical history, Social history, and Family history were reviewed and updated as appropriate.   Please see review of systems for further details on the patient's review from today.   Objective:   Physical Exam:  There were no vitals taken for this visit.  Physical Exam Constitutional:      General: She is not in acute distress.    Appearance: She is well-developed.  Musculoskeletal:        General: No deformity.  Neurological:     Mental Status: She is alert and oriented to person, place, and time.     Motor: No tremor.     Coordination: Coordination normal.     Gait: Gait normal.  Psychiatric:        Attention and Perception: She is attentive.        Mood and Affect: Mood is not anxious or depressed. Affect is not labile, blunt, angry or inappropriate.        Speech: Speech normal.        Behavior: Behavior  normal.        Thought Content:  Thought content is not paranoid or delusional. Thought content does not include homicidal or suicidal ideation. Thought content does not include suicidal plan.        Cognition and Memory: Cognition normal.        Judgment: Judgment normal.     Comments: Insight and general self-awareness is mildly  impaired chronically.     Lab Review:     Component Value Date/Time   NA 140 12/23/2021 1325   K 4.0 12/23/2021 1325   CL 108 12/23/2021 1325   CO2 23 12/23/2021 1325   GLUCOSE 101 (H) 12/23/2021 1325   BUN 17 12/23/2021 1325   CREATININE 0.86 12/23/2021 1325   CREATININE 0.85 05/10/2020 0940   CALCIUM 9.7 12/23/2021 1325   PROT 7.9 08/18/2021 1300   ALBUMIN 4.5 08/18/2021 1300   AST 27 08/18/2021 1300   ALT 35 08/18/2021 1300   ALKPHOS 90 08/18/2021 1300   BILITOT 0.3 08/18/2021 1300   GFRNONAA >60 12/23/2021 1325   GFRAA >90 08/23/2013 1600       Component Value Date/Time   WBC 6.4 12/23/2021 1325   RBC 5.04 12/23/2021 1325   HGB 13.7 12/23/2021 1325   HCT 42.2 12/23/2021 1325   PLT 257 12/23/2021 1325   MCV 83.7 12/23/2021 1325   MCH 27.2 12/23/2021 1325   MCHC 32.5 12/23/2021 1325   RDW 14.2 12/23/2021 1325   LYMPHSABS 2.1 01/17/2011 1812   MONOABS 0.7 01/17/2011 1812   EOSABS 0.1 01/17/2011 1812   BASOSABS 0.0 01/17/2011 1812    Lithium Lvl  Date Value Ref Range Status  12/23/2021 0.43 (L) 0.60 - 1.20 mmol/L Final    Comment:    Performed at [facility], [address], [place], [place] [address]  12/23/21 lithium level 0.43 afternoon on 600 mg HS    No results found for: "PHENYTOIN", "PHENOBARB", "VALPROATE", "CBMZ"   .res Assessment: Plan:    Major depressive disorder, recurrent episode, moderate (HCC) - Plan: Lithium level  Panic disorder with agoraphobia  Generalized anxiety disorder  Social anxiety disorder  Attention deficit hyperactivity disorder (ADHD), predominantly inattentive type  Insomnia due to mental condition  Lithium  use  Low vitamin D level  Memory change   Hx severe TRD with Suicide attempt.  Insight and general self-awareness is moderately impaired chronically complicating and delaying treatment.  Also she has not kept appointments as frequently as recommended.  She is not having panic attacks and overall depression is reasonably managed though her motivation and energy are low.  She is somewhat chronically dysfunctional to some degree. Disc her questions about memory effects of meds.  Consider reduction of lamotrigine if laboratory tests do not show reasons for memory problems..  She still night eating which might possibly be related to quetiapine.  OK reduce to 50 mg HS .  May taper lower bc no mood effects that low. But she can't sleep if it gets too low. Discussed potential metabolic side effects associated with atypical antipsychotics, as well as potential risk for movement side effects. Advised pt to contact office if movement side effects occur.   Counseled patient regarding potential benefits, risks, and side effects of lithium to include potential risk of lithium affecting thyroid and renal function.  Discussed need for periodic lab monitoring to determine drug level and to assess for potential adverse effects.  Counseled patient regarding signs and symptoms of lithium toxicity and advised that they  notify office immediately or seek urgent medical attention if experiencing these signs and symptoms.  Patient advised to contact office with any questions or concerns. Lithium level good October. OK continue lower dose lithium 600 mg daily but not lower.  Checked lithium labls  12/23/21 lithium level 0.43 afternoon on 600 mg HS   Worse with reduction in lamotrigine gradually to 150 mg daily and better on BID  Continue paroxetine 30 mg daily.  Better with NAC 1800 mg daily.  For memory complaints continue NAC bc it helped..    Constipation managed  Disc alexithymia and previous negative effects and  potential negative effects. Supportive therapy caretaking self defeating mother.  Option off label Aricept.  Don't change meds on your own DT severe illness in the past.  This appt was 30 mins.  FU 3 mos  Meredith Staggers, MD, DFAPA   Please see After Visit Summary for patient specific instructions.  Future Appointments  Date Time Provider Department Center  06/03/2022  1:40 PM Jake Bathe, MD [id number]     Orders Placed This Encounter  Procedures   Lithium level       -------------------------------

## 2022-05-16 DIAGNOSIS — J208 Acute bronchitis due to other specified organisms: Secondary | ICD-10-CM | POA: Diagnosis not present

## 2022-06-03 ENCOUNTER — Ambulatory Visit: Payer: BC Managed Care – PPO | Attending: Cardiology | Admitting: Cardiology

## 2022-06-03 ENCOUNTER — Ambulatory Visit: Payer: BC Managed Care – PPO | Attending: Cardiology

## 2022-06-03 ENCOUNTER — Encounter: Payer: Self-pay | Admitting: Cardiology

## 2022-06-03 VITALS — BP 110/80 | HR 75 | Ht 63.0 in | Wt 141.0 lb

## 2022-06-03 DIAGNOSIS — R002 Palpitations: Secondary | ICD-10-CM | POA: Diagnosis not present

## 2022-06-03 DIAGNOSIS — R0602 Shortness of breath: Secondary | ICD-10-CM | POA: Diagnosis not present

## 2022-06-03 NOTE — Progress Notes (Signed)
Cardiology Office Note:    Date:  06/03/2022   ID:  [name], DOB 01/17/1971, MRN [id number]  PCP:  Lawerance Cruel, [facility] Cardiologist:  Candee Furbish, MD     Referring MD: Lawerance Cruel, MD     History of Present Illness:    [name] is a 50 y.o. female with a hx of depression, anxiety, and Hashimoto's disease who presents with management for heart palpitations. She was seen in the ED on 12/23/2021 for heart palpitations and fatigue. She had no evidence of electrolyte abnormality or signs of arrhythmia upon ED visit.  She has been experiencing palpitations which she attributes to her anxiety and stress. She describes that these palpitations feel like a "pounding" sensation.  The reason that drove her to visit the ED was her palpitations became constant and stronger. She also felt having the urge to cough. Once she got admitted in the ED, her palpitations diminished. She noted that she had palpitations before but nothing like this. She also endorsed feeling shortness of breath while this was occurring.  She noted another time she had an episode and she checked her blood pressure at home, which was [id number] systolic and [id number] diastolic. This drove her to visit the ED. She noted that she was feeling lightheadedness during this episode as well.  Her mother has a history of CHF.  She is currently on Lithium, which she had a concern if it was correlated to her palpitations.  She denies any, chest pain or peripheral edema. No headaches, syncope, orthopnea, or PND.  Past Medical History:  Diagnosis Date   Depression    Hashimoto's disease    Kidney stones    Migraine    Palpitations     Past Surgical History:  Procedure Laterality Date   WISDOM TOOTH EXTRACTION      Current Medications: Current Meds  Medication Sig   Acetylcysteine 600 MG CAPS Take 1 capsule (600 mg total) by mouth 2 (two) times daily.   b complex vitamins  tablet Take 1 tablet by mouth daily.   Docusate Calcium (STOOL SOFTENER PO) Take by mouth 3 (three) times daily.   lamoTRIgine (LAMICTAL) 150 MG tablet Take 1 tablet (150 mg total) by mouth 2 (two) times daily.   levothyroxine (SYNTHROID, LEVOTHROID) 125 MCG tablet Take 125 mcg by mouth daily before breakfast.    lithium carbonate (LITHOBID) 300 MG CR tablet Take 2 tablets (600 mg total) by mouth daily.   Magnesium 200 MG TABS Take by mouth daily.   Multiple Vitamins-Minerals (WOMENS MULTIVITAMIN PLUS PO) Take 1 tablet by mouth daily.    PARoxetine (PAXIL) 30 MG tablet Take 1 tablet (30 mg total) by mouth daily.   Polyethylene Glycol 3350 (MIRALAX PO) Take by mouth daily.   QUEtiapine (SEROQUEL) 100 MG tablet Take 1 tablet (100 mg total) by mouth at bedtime.   verapamil (CALAN-SR) 240 MG CR tablet Take 240 mg by mouth daily.     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 She lives with her husband and 4 children in a one level home. She drinks one glass of tea (8-16 oz) a day. She does not exercise. Some college.   Social Determinants of Health   Financial Resource Strain: Not on file  Food Insecurity: Not on file  Transportation Needs: Not on file  Physical Activity: Not on file  Stress: Not on file   Social Connections: Not on file     Family History: The patient's family history includes Depression in her mother; Hypertension in her sister; Migraines in her mother and sister; Obesity in her mother; Rheum arthritis in her sister.  ROS:   Please see the history of present illness.    (+) Stress (+) Lightheadedness (+) Anxiety (+) Palpitations  (+) Shortness of breath All other systems reviewed and are negative.  EKGs/Labs/Other Studies Reviewed:    The following studies were reviewed today:   EKG: EKG is personally reviewed. 06/03/2022:Sinus rhythm, Rate 73 bpm.  12/23/2021: Sinus rhythm, borderline right axis deviation, 61 bpm.  Recent Labs: 08/18/2021: ALT 35 12/23/2021: BUN 17; Creatinine, Ser 0.86; Hemoglobin 13.7; Platelets 257; Potassium 4.0; Sodium 140  Recent Lipid Panel No results found for: "CHOL", "TRIG", "HDL", "CHOLHDL", "VLDL", "LDLCALC", "LDLDIRECT"   Risk Assessment/Calculations:               Physical Exam:    VS:  BP 110/80 (BP Location: Left Arm, Patient Position: Sitting, Cuff Size: Normal)   Pulse 75   Ht 5\' 3"  (1.6 m)   Wt 141 lb (64 kg)   BMI 24.98 kg/m     Wt Readings from Last 3 Encounters:  06/03/22 141 lb (64 kg)  08/18/21 145 lb 15.1 oz (66.2 kg)  09/05/20 146 lb (66.2 kg)     GEN:  Well nourished, well developed in no acute distress HEENT: Normal NECK: No JVD; No carotid bruits LYMPHATICS: No lymphadenopathy CARDIAC: RRR, no murmurs, rubs, gallops RESPIRATORY:  Clear to auscultation without rales, wheezing or rhonchi  ABDOMEN: Soft, non-tender, non-distended MUSCULOSKELETAL:  No edema; No deformity  SKIN: Warm and dry NEUROLOGIC:  Alert and oriented x 3 PSYCHIATRIC:  Normal affect   ASSESSMENT:    1. Palpitations   2. Shortness of breath    PLAN:    In order of problems listed above:  Palpitations - We will check a Zio patch monitor.  Want to make sure that there are no adverse arrhythmias.  Pounding of heart  could have been an atrial tachycardia.  She did state that it gradually decreased which usually goes against a sudden onset/offset such as with reentrant tachycardia etc.  Certainly anxiety, stress hormones could be playing a role.  If work-up comes back unremarkable, continue with conservative management strategies, deep breathing, decreasing caffeine, daily exercise etc.  Shortness of breath - We will check echocardiogram.  Ensure proper structure and function of heart.  We will also go ahead and check lab work including TSH.  Her psychiatrist is going to be checking a lithium level soon.  Her current EKG appears normal.  Normal intervals on lithium.           Follow-up: We  will follow-up with results of studies  Medication Adjustments/Labs and Tests Ordered: Current medicines are reviewed at length with the patient today.  Concerns regarding medicines are outlined above.  Orders Placed This Encounter  Procedures   Comprehensive metabolic panel   CBC   TSH   LONG TERM MONITOR (3-14 DAYS)   EKG 12-Lead   ECHOCARDIOGRAM COMPLETE   No orders of the defined types were placed in this encounter.   Patient Instructions  Medication Instructions: The current medical regimen is effective;  continue present plan and medications.  *If you need a refill on your cardiac medications before your next appointment, please call your pharmacy*   Lab Work: Please have blood work today (CMP, CBC and TSH)  If you have labs (blood work) drawn today and your tests are completely normal, you will receive your results only by: [place] (if you have MyChart) OR A paper copy in the mail If you have any lab test that is abnormal or we need to change your treatment, we will call you to review the results.   Testing/Procedures: Your physician has requested that you have an echocardiogram. Echocardiography is a painless test that uses sound waves to create images of your heart. It provides your  doctor with information about the size and shape of your heart and how well your heart's chambers and valves are working. This procedure takes approximately one hour. There are no restrictions for this procedure. Please do NOT wear cologne, perfume, aftershave, or lotions (deodorant is allowed). Please arrive 15 minutes prior to your appointment time.  ZIO XT- Long Term Monitor Instructions  Your physician has requested you wear a ZIO patch monitor for 14 days.  This is a single patch monitor. [employer] supplies one patch monitor per enrollment. Additional stickers are not available. Please do not apply patch if you will be having a Nuclear Stress Test,  Echocardiogram, Cardiac CT, MRI, or Chest Xray during the period you would be wearing the  monitor. The patch cannot be worn during these tests. You cannot remove and re-apply the  ZIO XT patch monitor.  Your ZIO patch monitor will be mailed 3 day USPS to your address on file. It may take 3-5 days  to receive your monitor after you have been enrolled.  Once you have received your monitor, please review the enclosed instructions. Your monitor  has already been registered assigning a specific monitor serial # to you.  Billing and Patient Assistance Program Information  We have supplied [employer] with any of your insurance information on file for billing purposes. [employer] offers a sliding scale Patient Assistance Program for patients that do not have  insurance, or whose insurance does not completely cover the cost of the ZIO monitor.  You must apply for the Patient Assistance Program to qualify for this discounted rate.  To apply, please call [employer] at [phone], select option 4, select option 2, ask to apply for  Patient Assistance Program. [name] will ask your household income, and how many people  are in your household. They will quote your out-of-pocket cost based on that information.  [employer] will also be able to set up a 71-month,  interest-free payment plan if needed.  Applying the monitor   Shave hair from upper left chest.  Hold abrader disc by orange tab. Rub abrader in 40 strokes over the upper left chest as  indicated in your monitor instructions.  Clean area with 4 enclosed alcohol pads. Let dry.  Apply patch as  indicated in monitor instructions. Patch will be placed under collarbone on left  side of chest with arrow pointing upward.  Rub patch adhesive wings for 2 minutes. Remove white label marked "1". Remove the white  label marked "2". Rub patch adhesive wings for 2 additional minutes.  While looking in a mirror, press and release button in center of patch. A small green light will  flash 3-4 times. This will be your only indicator that the monitor has been turned on.  Do not shower for the first 24 hours. You may shower after the first 24 hours.  Press the button if you feel a symptom. You will hear a small click. Record Date, Time and  Symptom in the Patient Logbook.  When you are ready to remove the patch, follow instructions on the last 2 pages of Patient  Logbook. Stick patch monitor onto the last page of Patient Logbook.  Place Patient Logbook in the blue and white box. Use locking tab on box and tape box closed  securely. The blue and white box has prepaid postage on it. Please place it in the mailbox as  soon as possible. Your physician should have your test results approximately 7 days after the  monitor has been mailed back to [facility].  Call [facility] at [phone] if you have questions regarding  your ZIO XT patch monitor. Call them immediately if you see an orange light blinking on your  monitor.  If your monitor falls off in less than 4 days, contact our Monitor department at [phone].  If your monitor becomes loose or falls off after 4 days call [employer] at [phone] for  suggestions on securing your monitor   Follow-Up: At [facility], you  and your health needs are our priority.  As part of our continuing mission to provide you with exceptional heart care, we have created designated Provider Care Teams.  These Care Teams include your primary Cardiologist (physician) and Advanced Practice Providers (APPs -  Physician Assistants and Nurse Practitioners) who all work together to provide you with the care you need, when you need it.  We recommend signing up for the patient portal called "MyChart".  Sign up information is provided on this After Visit Summary.  MyChart is used to connect with patients for Virtual Visits (Telemedicine).  Patients are able to view lab/test results, encounter notes, upcoming appointments, etc.  Non-urgent messages can be sent to your provider as well.   To learn more about what you can do with MyChart, go to [email or web address].    Your next appointment:   Follow up will be based on the results of the above testing.   Important Information About Sugar          I,Danny Valdes,acting as a scribe for [employer], MD.,have documented all relevant documentation on the behalf of Candee Furbish, MD,as directed by  Candee Furbish, MD while in the presence of Candee Furbish, MD.  I, Candee Furbish, MD, have reviewed all documentation for this visit. The documentation on 06/03/22 for the exam, diagnosis, procedures, and orders are all accurate and complete.   Signed, Candee Furbish, MD  06/03/2022 3:43 PM    [place]

## 2022-06-03 NOTE — Patient Instructions (Signed)
Medication Instructions: The current medical regimen is effective;  continue present plan and medications.  *If you need a refill on your cardiac medications before your next appointment, please call your pharmacy*   Lab Work: Please have blood work today (CMP, CBC and TSH)  If you have labs (blood work) drawn today and your tests are completely normal, you will receive your results only by: [place] (if you have MyChart) OR A paper copy in the mail If you have any lab test that is abnormal or we need to change your treatment, we will call you to review the results.   Testing/Procedures: Your physician has requested that you have an echocardiogram. Echocardiography is a painless test that uses sound waves to create images of your heart. It provides your doctor with information about the size and shape of your heart and how well your heart's chambers and valves are working. This procedure takes approximately one hour. There are no restrictions for this procedure. Please do NOT wear cologne, perfume, aftershave, or lotions (deodorant is allowed). Please arrive 15 minutes prior to your appointment time.  ZIO XT- Long Term Monitor Instructions  Your physician has requested you wear a ZIO patch monitor for 14 days.  This is a single patch monitor. [employer] supplies one patch monitor per enrollment. Additional stickers are not available. Please do not apply patch if you will be having a Nuclear Stress Test,  Echocardiogram, Cardiac CT, MRI, or Chest Xray during the period you would be wearing the  monitor. The patch cannot be worn during these tests. You cannot remove and re-apply the  ZIO XT patch monitor.  Your ZIO patch monitor will be mailed 3 day USPS to your address on file. It may take 3-5 days  to receive your monitor after you have been enrolled.  Once you have received your monitor, please review the enclosed instructions. Your monitor  has already been registered assigning a  specific monitor serial # to you.  Billing and Patient Assistance Program Information  We have supplied [employer] with any of your insurance information on file for billing purposes. [employer] offers a sliding scale Patient Assistance Program for patients that do not have  insurance, or whose insurance does not completely cover the cost of the ZIO monitor.  You must apply for the Patient Assistance Program to qualify for this discounted rate.  To apply, please call [employer] at [phone], select option 4, select option 2, ask to apply for  Patient Assistance Program. [name] will ask your household income, and how many people  are in your household. They will quote your out-of-pocket cost based on that information.  [employer] will also be able to set up a 51-month, interest-free payment plan if needed.  Applying the monitor   Shave hair from upper left chest.  Hold abrader disc by orange tab. Rub abrader in 40 strokes over the upper left chest as  indicated in your monitor instructions.  Clean area with 4 enclosed alcohol pads. Let dry.  Apply patch as indicated in monitor instructions. Patch will be placed under collarbone on left  side of chest with arrow pointing upward.  Rub patch adhesive wings for 2 minutes. Remove white label marked "1". Remove the white  label marked "2". Rub patch adhesive wings for 2 additional minutes.  While looking in a mirror, press and release button in center of patch. A small green light will  flash 3-4 times. This will be your only indicator that the monitor has  been turned on.  Do not shower for the first 24 hours. You may shower after the first 24 hours.  Press the button if you feel a symptom. You will hear a small click. Record Date, Time and  Symptom in the Patient Logbook.  When you are ready to remove the patch, follow instructions on the last 2 pages of Patient  Logbook. Stick patch monitor onto the last page of Patient Logbook.  Place Patient  Logbook in the blue and white box. Use locking tab on box and tape box closed  securely. The blue and white box has prepaid postage on it. Please place it in the mailbox as  soon as possible. Your physician should have your test results approximately 7 days after the  monitor has been mailed back to [facility].  Call [facility] Customer Care at [phone] if you have questions regarding  your ZIO XT patch monitor. Call them immediately if you see an orange light blinking on your  monitor.  If your monitor falls off in less than 4 days, contact our Monitor department at [phone].  If your monitor becomes loose or falls off after 4 days call [employer] at [phone] for  suggestions on securing your monitor   Follow-Up: At [facility], you and your health needs are our priority.  As part of our continuing mission to provide you with exceptional heart care, we have created designated Provider Care Teams.  These Care Teams include your primary Cardiologist (physician) and Advanced Practice Providers (APPs -  Physician Assistants and Nurse Practitioners) who all work together to provide you with the care you need, when you need it.  We recommend signing up for the patient portal called "MyChart".  Sign up information is provided on this After Visit Summary.  MyChart is used to connect with patients for Virtual Visits (Telemedicine).  Patients are able to view lab/test results, encounter notes, upcoming appointments, etc.  Non-urgent messages can be sent to your provider as well.   To learn more about what you can do with MyChart, go to [email or web address].    Your next appointment:   Follow up will be based on the results of the above testing.   Important Information About Sugar

## 2022-06-03 NOTE — Progress Notes (Unsigned)
Enrolled for [employer] to mail a ZIO XT long term holter monitor to the patients address on file.  

## 2022-06-04 DIAGNOSIS — F331 Major depressive disorder, recurrent, moderate: Secondary | ICD-10-CM | POA: Diagnosis not present

## 2022-06-04 LAB — COMPREHENSIVE METABOLIC PANEL
ALT: 22 IU/L (ref 0–32)
AST: 21 IU/L (ref 0–40)
Albumin/Globulin Ratio: 1.6 (ref 1.2–2.2)
Albumin: 4.4 g/dL (ref 3.9–4.9)
Alkaline Phosphatase: 113 IU/L (ref 44–121)
BUN/Creatinine Ratio: 12 (ref 9–23)
BUN: 11 mg/dL (ref 6–24)
Bilirubin Total: 0.2 mg/dL (ref 0.0–1.2)
CO2: 21 mmol/L (ref 20–29)
Calcium: 9.3 mg/dL (ref 8.7–10.2)
Chloride: 104 mmol/L (ref 96–106)
Creatinine, Ser: 0.93 mg/dL (ref 0.57–1.00)
Globulin, Total: 2.7 g/dL (ref 1.5–4.5)
Glucose: 81 mg/dL (ref 70–99)
Potassium: 4.3 mmol/L (ref 3.5–5.2)
Sodium: 140 mmol/L (ref 134–144)
Total Protein: 7.1 g/dL (ref 6.0–8.5)
eGFR: 75 mL/min/{1.73_m2} (ref >59)

## 2022-06-04 LAB — CBC
Hematocrit: 42.7 % (ref 34.0–46.6)
Hemoglobin: 14 g/dL (ref 11.1–15.9)
MCH: 29 pg (ref 26.6–33.0)
MCHC: 32.8 g/dL (ref 31.5–35.7)
MCV: 88 fL (ref 79–97)
Platelets: 310 10*3/uL (ref 150–450)
RBC: 4.83 x10E6/uL (ref 3.77–5.28)
RDW: 12.1 % (ref 11.7–15.4)
WBC: 9 10*3/uL (ref 3.4–10.8)

## 2022-06-04 LAB — TSH: TSH: 0.486 u[IU]/mL (ref 0.450–4.500)

## 2022-06-05 LAB — LITHIUM LEVEL: Lithium Lvl: 0.5 mmol/L — ABNORMAL LOW (ref 0.6–1.2)

## 2022-06-06 ENCOUNTER — Encounter: Payer: Self-pay | Admitting: *Deleted

## 2022-06-06 DIAGNOSIS — R002 Palpitations: Secondary | ICD-10-CM

## 2022-06-27 DIAGNOSIS — R002 Palpitations: Secondary | ICD-10-CM | POA: Diagnosis not present

## 2022-07-02 ENCOUNTER — Ambulatory Visit (HOSPITAL_COMMUNITY): Payer: BC Managed Care – PPO | Attending: Cardiology

## 2022-07-02 DIAGNOSIS — R0602 Shortness of breath: Secondary | ICD-10-CM | POA: Diagnosis not present

## 2022-07-02 DIAGNOSIS — R002 Palpitations: Secondary | ICD-10-CM | POA: Insufficient documentation

## 2022-07-02 LAB — ECHOCARDIOGRAM COMPLETE
Area-P 1/2: 3.36 cm2
S' Lateral: 2.7 cm

## 2022-07-08 ENCOUNTER — Encounter: Payer: Self-pay | Admitting: *Deleted

## 2022-07-23 ENCOUNTER — Other Ambulatory Visit: Payer: Self-pay | Admitting: Psychiatry

## 2022-07-23 DIAGNOSIS — F5105 Insomnia due to other mental disorder: Secondary | ICD-10-CM

## 2022-07-23 DIAGNOSIS — F331 Major depressive disorder, recurrent, moderate: Secondary | ICD-10-CM

## 2022-07-23 DIAGNOSIS — F411 Generalized anxiety disorder: Secondary | ICD-10-CM

## 2022-07-25 ENCOUNTER — Other Ambulatory Visit: Payer: Self-pay | Admitting: Psychiatry

## 2022-07-25 DIAGNOSIS — F331 Major depressive disorder, recurrent, moderate: Secondary | ICD-10-CM

## 2022-09-10 DIAGNOSIS — R7301 Impaired fasting glucose: Secondary | ICD-10-CM | POA: Diagnosis not present

## 2022-09-10 DIAGNOSIS — E559 Vitamin D deficiency, unspecified: Secondary | ICD-10-CM | POA: Diagnosis not present

## 2022-09-10 DIAGNOSIS — I10 Essential (primary) hypertension: Secondary | ICD-10-CM | POA: Diagnosis not present

## 2022-09-10 DIAGNOSIS — E063 Autoimmune thyroiditis: Secondary | ICD-10-CM | POA: Diagnosis not present

## 2022-09-10 DIAGNOSIS — Z13220 Encounter for screening for lipoid disorders: Secondary | ICD-10-CM | POA: Diagnosis not present

## 2022-09-17 ENCOUNTER — Ambulatory Visit (INDEPENDENT_AMBULATORY_CARE_PROVIDER_SITE_OTHER): Payer: BC Managed Care – PPO | Admitting: Psychiatry

## 2022-09-17 ENCOUNTER — Encounter: Payer: Self-pay | Admitting: Psychiatry

## 2022-09-17 DIAGNOSIS — F331 Major depressive disorder, recurrent, moderate: Secondary | ICD-10-CM | POA: Diagnosis not present

## 2022-09-17 DIAGNOSIS — F411 Generalized anxiety disorder: Secondary | ICD-10-CM

## 2022-09-17 DIAGNOSIS — F4001 Agoraphobia with panic disorder: Secondary | ICD-10-CM

## 2022-09-17 DIAGNOSIS — F4010 Social phobia, unspecified: Secondary | ICD-10-CM

## 2022-09-17 DIAGNOSIS — R7989 Other specified abnormal findings of blood chemistry: Secondary | ICD-10-CM

## 2022-09-17 DIAGNOSIS — F900 Attention-deficit hyperactivity disorder, predominantly inattentive type: Secondary | ICD-10-CM

## 2022-09-17 DIAGNOSIS — F5105 Insomnia due to other mental disorder: Secondary | ICD-10-CM

## 2022-09-17 DIAGNOSIS — R413 Other amnesia: Secondary | ICD-10-CM

## 2022-09-17 DIAGNOSIS — G251 Drug-induced tremor: Secondary | ICD-10-CM

## 2022-09-17 DIAGNOSIS — Z79899 Other long term (current) drug therapy: Secondary | ICD-10-CM

## 2022-09-17 MED ORDER — PAROXETINE HCL 30 MG PO TABS: 30.0000 mg | ORAL_TABLET | Freq: Every day | ORAL | 1 refills | Status: DC

## 2022-09-17 MED ORDER — LAMOTRIGINE 150 MG PO TABS: 150.0000 mg | ORAL_TABLET | Freq: Two times a day (BID) | ORAL | 1 refills | Status: DC

## 2022-09-17 MED ORDER — QUETIAPINE FUMARATE 100 MG PO TABS: 100.0000 mg | ORAL_TABLET | Freq: Every day | ORAL | 1 refills | Status: DC

## 2022-09-17 MED ORDER — LITHIUM CARBONATE ER 300 MG PO TBCR: 600.0000 mg | EXTENDED_RELEASE_TABLET | Freq: Every day | ORAL | 1 refills | Status: DC

## 2022-09-17 NOTE — Progress Notes (Signed)
[name] [id number] [birth year] 52 y.o.  Subjective:   Patient ID:  [name] is a 52 y.o. (DOB 1971-07-03) female.  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  At appt Sep 02, 2018. Raised Concerta to 27 mg but then she had anxiety and couple panic and so it was reduced to 18 mg and anxiety resolved.  appointment May 2020.  No meds were changed.  November 2020 appointment with the following noted: Out of the stimulant.  Didn't pursue it as not critical..  Overall OK considering social matters. Concerned about her memory.  Loses track in conversation.  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 Overall pretty good mood.  Still tired in the am but less so with IR Seroquel lower dose..  Some anxiety with Covid.  Wasn't following thoughts to dark and scary places.  Loves being at home so ok. Getting things done and that feels really good.  Put off home schooling until July.  Better mood. Bc tremor she reduced lithium from 1200 to 900 on April 15 and the tremor is better not gone and feels the same.  No SI since here. Still has to nap and rest at times with Concerta but it seemed to help, but hard to explain.  Clarity better.  Easier to make decisions.  Don't feel as daft.  Still hard listening to sermons, drift off all my life.  Is doing better with home schooling.  Still night eating.  Sleep 8-10 hours. Plan restart Concerta and check labs for cognition.  01/20/2020 appointment with the following noted: Patient should have been seen since November before this time. Did not do well with Concerta 18  bc too anxious. Perimenopausal.  Moody.  Some irritability. Struggling a lot with migraine but seems to be getting better.  Gets emotionally overwhelmed but not real dark moods.  Some  sadness. Productivity is better lately. 1 panic since here and thought was having MI.  Anxiety is better now.  Tax season hard. Sleep great with routine.  Fidgety all the time is somewhat embarrassing.  No discomfort. Started diet and less nighteating now over the last couple of weeks.  Plan: For memory complaints check B12 and folate and lamotrigine level.  Also need to check lithium and BMP.  PCP checks thyroid.  10/11/20 appt noted: Pretty good.  Depression under control.  Anxiety is OK unless too much caffeine.   Patient reports stable mood and denies depressed or irritable moods.  Patient denies any recent difficulty with anxiety.  Patient denies difficulty with sleep initiation or maintenance. Denies appetite disturbance.  Patient reports that energy and motivation have been good.  Patient denies any difficulty with concentration.  Patient denies any suicidal ideation. Has a little tremor.   Saw benefit from [employer]op[employer] for cognition but ran out bc difficulty getting it. Still really hungry at night after Seroquel.  Asks about reduction. Plan: Trial propranolol 20-40 mg prn tremor  06/13/21 appt noted: Never tried propranolol. Decreased lithium to 2 tablets at night for 5-6 mos to get rid of tremor and ready to decrease stuff. Wants to decrease more.  Tried to reduce quetiapine from 100 to 50 and couldn't sleep. Sleep 8 hours. Depression under control.  Mild anxiety panic flutters before Scott's surgery and channeled it  into cleaning.Nicki Reaper doing well now after surgery for diverticulitis. Dr. Alcide Evener. Plan: OK continue lower dose lithium 600 mg daily but not lower. Check lithium labls Continue quetiapine 50 mg nightly Ok trial reduction in lamotrigine gradually to 150 mg daily over a month. Continue paroxetine 30 mg daily.  01/15/22 appt noted: Feels better after cholecystectomy. Mood is good. Tried reducing lamotrigine to 150 mg daily but more depressed in just a few days and  increased it back to 2 daily. Compliant and no sig SE. Botox helped migraine.  More productive and reliable. Overall depression and anxiety are under control. Had heart flip flop issue a couple of weeks ago without trigger. Also dizzy and went to ER, neg workup.  Out of the blue.  05/15/22 appt noted:   Hasn't gotten lithium level. Heart palpitations again.  Will see cardiologist this month.  It sneaks up on her and can last 90 mins.  Not caused by anxiety. Taking verapamil for htn. No panic.   Increased stress caretaking mother.   Mo won't do much. That has been a problem in the past.  D Sarah in [place].  Pastor left.  Maybe internalizing stress. Trouble getting out of the house or contacting friends.  Not sure why.  Don't want to do anything.  "I guess so", re: overwhelmed. Sleep is good. No SI since here. Plan: OK continue lower dose lithium 600 mg daily but not lower. Checked lithium labls  12/23/21 lithium level 0.43 afternoon on 600 mg HS  Worse with reduction in lamotrigine gradually to 150 mg daily and better on BID Continue paroxetine 30 mg daily, Continue quetiapine 50 HS Better with NAC 1800 mg daily.  For memory complaints continue NAC bc it helped..    09/17/22 appt noted: Meds as above except quetiapine 100 mg HS.  Sleep 8-10 hours but drowsy for 12 hours Botox helped HA Doing very well with mood and anxiety.  Some weeks much better. Noticed on AVS dx of ADHD.  Since read that has felt free and no longer been as hard on myself.  Has been easier on herself. And therefore less negative self talk.  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  Will have trouble focusing at times and get distracted even in conversation. Going to [place] with oldest.  Past Psychiatric Medication Trials:  Concerta 18 SE anxiety, modafinil 200 side effect, Ritalin, Loxitane 60, Seroquel XR 600, Abilify, Rexulti side effects,  risperidone lamotrigine 300,  lithium, Depakote side effects, buspirone  30 twice daily, propranolol, gabapentin, topiramate, , imipramine, venlafaxine, sertraline, Lexapro 20 nr, Wellbutrin,  selegiline, duloxetine side effects,  paroxetine 30 temazepam Deplin,   Relpax depression  Review of Systems:  Negative for weakness, + weight gain, trmor, connstipation , reflux resolved, some HA better with chiropracter, hearing is getting worse. No CP, cough Increased palpitations  Medications: I have reviewed the patient's current medications.  Current Outpatient Medications  Medication Sig Dispense Refill   Acetylcysteine 600 MG CAPS Take 1 capsule (600 mg total) by mouth 2 (two) times daily. 270 capsule 0   b complex vitamins tablet Take 1 tablet by mouth daily.     Docusate Calcium (STOOL SOFTENER PO) Take by mouth 3 (three) times daily.     levothyroxine (SYNTHROID, LEVOTHROID) 125 MCG tablet Take 125 mcg by mouth daily before breakfast.      Magnesium 200 MG TABS Take by mouth daily.     Multiple Vitamins-Minerals (WOMENS MULTIVITAMIN PLUS PO) Take 1 tablet  by mouth daily.      Polyethylene Glycol 3350 (MIRALAX PO) Take by mouth daily.     verapamil (CALAN-SR) 240 MG CR tablet Take 240 mg by mouth daily.     lamoTRIgine (LAMICTAL) 150 MG tablet Take 1 tablet (150 mg total) by mouth 2 (two) times daily. 180 tablet 1   lithium carbonate (LITHOBID) 300 MG ER tablet Take 2 tablets (600 mg total) by mouth daily. 180 tablet 1   PARoxetine (PAXIL) 30 MG tablet Take 1 tablet (30 mg total) by mouth daily. 90 tablet 1   QUEtiapine (SEROQUEL) 100 MG tablet Take 1 tablet (100 mg total) by mouth at bedtime. 90 tablet 1   No current facility-administered medications for this visit.    Medication Side Effects: Other: night eating may be related. , tremor  Allergies:  Allergies  Allergen Reactions   Perphenazine Anaphylaxis and Swelling    Throat swelling and facial muscle contractions     Imipramine Other (See Comments)    Can't remember adverse effect   Penicillins  Rash   Albuterol    Other     Phenegran   Codeine Rash    Past Medical History:  Diagnosis Date   Depression    Hashimoto's disease    Kidney stones    Migraine    Palpitations     Family History  Problem Relation Age of Onset   Depression Mother    Migraines Mother    Obesity Mother    Rheum arthritis Sister    Migraines Sister    Hypertension Sister     Social History   Socioeconomic History   Marital status: Married    Spouse name: Event organiser   Number of children: 4   Years of education: 14   Highest education level: Some college, no degree  Occupational History   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 She lives with her husband and 4 children in a one level home. She drinks one glass of tea (8-16 oz) a day. She does not exercise. Some college.   Social Determinants of Health   Financial Resource Strain: Not on file  Food Insecurity: Not on file  Transportation Needs: Not on file  Physical Activity: Not on file  Stress: Not on file  Social Connections: Not on file  Intimate Partner Violence: Not on file    Past Medical History, Surgical history, Social history, and Family history were reviewed and updated as appropriate.   Please see review of systems for further details on the patient's review from today.   Objective:   Physical Exam:  There were no vitals taken for this visit.  Physical Exam Constitutional:      General: She is not in acute distress.    Appearance: She is well-developed.  Musculoskeletal:        General: No deformity.  Neurological:      Mental Status: She is alert and oriented to person, place, and time.     Motor: No tremor.  Coordination: Coordination normal.     Gait: Gait normal.  Psychiatric:        Attention and Perception: She is attentive.        Mood and Affect: Mood is not anxious or depressed. Affect is not labile, blunt or angry.        Speech: Speech normal.        Behavior: Behavior normal.        Thought Content: Thought content is not paranoid or delusional. Thought content does not include homicidal or suicidal ideation. Thought content does not include suicidal plan.        Cognition and Memory: Cognition normal.        Judgment: Judgment normal.     Comments: Insight and general self-awareness is mildly  impaired chronically but better     Lab Review:     Component Value Date/Time   NA 140 06/03/2022 1449   K 4.3 06/03/2022 1449   CL 104 06/03/2022 1449   CO2 21 06/03/2022 1449   GLUCOSE 81 06/03/2022 1449   GLUCOSE 101 (H) 12/23/2021 1325   BUN 11 06/03/2022 1449   CREATININE 0.93 06/03/2022 1449   CREATININE 0.85 05/10/2020 0940   CALCIUM 9.3 06/03/2022 1449   PROT 7.1 06/03/2022 1449   ALBUMIN 4.4 06/03/2022 1449   AST 21 06/03/2022 1449   ALT 22 06/03/2022 1449   ALKPHOS 113 06/03/2022 1449   BILITOT 0.2 06/03/2022 1449   GFRNONAA >60 12/23/2021 1325   GFRAA >90 08/23/2013 1600       Component Value Date/Time   WBC 9.0 06/03/2022 1449   WBC 6.4 12/23/2021 1325   RBC 4.83 06/03/2022 1449   RBC 5.04 12/23/2021 1325   HGB 14.0 06/03/2022 1449   HCT 42.7 06/03/2022 1449   PLT 310 06/03/2022 1449   MCV 88 06/03/2022 1449   MCH 29.0 06/03/2022 1449   MCH 27.2 12/23/2021 1325   MCHC 32.8 06/03/2022 1449   MCHC 32.5 12/23/2021 1325   RDW 12.1 06/03/2022 1449   LYMPHSABS 2.1 01/17/2011 1812   MONOABS 0.7 01/17/2011 1812   EOSABS 0.1 01/17/2011 1812   BASOSABS 0.0 01/17/2011 1812    Lithium Lvl  Date Value Ref Range Status  06/04/2022 0.5 (L) 0.6 - 1.2 mmol/L Final   12/23/21 lithium level 0.43 afternoon on 600 mg HS    No results found for: "PHENYTOIN", "PHENOBARB", "VALPROATE", "CBMZ"   .res Assessment: Plan:    Major depressive disorder, recurrent episode, moderate (HCC) - Plan: lamoTRIgine (LAMICTAL) 150 MG tablet, lithium carbonate (LITHOBID) 300 MG ER tablet, PARoxetine (PAXIL) 30 MG tablet, QUEtiapine (SEROQUEL) 100 MG tablet  Panic disorder with agoraphobia - Plan: PARoxetine (PAXIL) 30 MG tablet  Generalized anxiety disorder - Plan: PARoxetine (PAXIL) 30 MG tablet, QUEtiapine (SEROQUEL) 100 MG tablet  Social anxiety disorder - Plan: PARoxetine (PAXIL) 30 MG tablet  Attention deficit hyperactivity disorder (ADHD), predominantly inattentive type  Insomnia due to mental condition - Plan: QUEtiapine (SEROQUEL) 100 MG tablet  Lithium use  Low vitamin D level  Memory change  Lithium-induced tremor   Hx severe TRD with Suicide attempt.  Insight and general self-awareness is moderately impaired chronically complicating and delaying treatment.  Also she has not kept appointments as frequently as recommended.  She is not having panic attacks and overall depression is reasonably managed though her motivation and energy are low.  She is somewhat chronically dysfunctional to some degree. Disc her questions about memory effects of meds.  Consider reduction of  lamotrigine if laboratory tests do not show reasons for memory problems..  She still night eating which might possibly be related to quetiapine.  OK reduce to 50 mg HS .  May taper lower bc no mood effects that low. But she can't sleep if it gets too low. Option switch to chlorpromazine but she elects not to do so. Discussed potential metabolic side effects associated with atypical antipsychotics, as well as potential risk for movement side effects. Advised pt to contact office if movement side effects occur.   Counseled patient regarding potential benefits, risks, and side effects of lithium  to include potential risk of lithium affecting thyroid and renal function.  Discussed need for periodic lab monitoring to determine drug level and to assess for potential adverse effects.  Counseled patient regarding signs and symptoms of lithium toxicity and advised that they notify office immediately or seek urgent medical attention if experiencing these signs and symptoms.  Patient advised to contact office with any questions or concerns. Lithium level good October.  Continue meds: OK continue lower dose lithium 600 mg daily but not lower. Checked lithium labls  12/23/21 lithium level 0.43 afternoon on 600 mg HS  Worse with reduction in lamotrigine gradually to 150 mg daily and better on BID Continue paroxetine 30 mg daily. Better with NAC 1800 mg daily.  For memory complaints continue NAC bc it helped..   quetiapine 50-100 mg HS  Constipation managed  Option B6 or propranolol for lithium tremor.  She'll try B6  Disc alexithymia and previous negative effects and potential negative effects. Supportive therapy caretaking self defeating mother.  Option off label Aricept or Namenda for ADD and cognitive complaints.  Don't change meds on your own DT severe illness in the past.  This appt was 30 mins.  FU 4-6 mos  Lynder Parents, MD, DFAPA   Please see After Visit Summary for patient specific instructions.  No future appointments.    No orders of the defined types were placed in this encounter.      -------------------------------

## 2022-09-27 ENCOUNTER — Other Ambulatory Visit: Payer: Self-pay | Admitting: Psychiatry

## 2022-09-27 DIAGNOSIS — F4001 Agoraphobia with panic disorder: Secondary | ICD-10-CM

## 2022-09-27 DIAGNOSIS — F331 Major depressive disorder, recurrent, moderate: Secondary | ICD-10-CM

## 2022-09-27 DIAGNOSIS — F4010 Social phobia, unspecified: Secondary | ICD-10-CM

## 2022-09-27 DIAGNOSIS — F411 Generalized anxiety disorder: Secondary | ICD-10-CM

## 2022-10-08 DIAGNOSIS — I10 Essential (primary) hypertension: Secondary | ICD-10-CM | POA: Diagnosis not present

## 2022-10-08 DIAGNOSIS — E063 Autoimmune thyroiditis: Secondary | ICD-10-CM | POA: Diagnosis not present

## 2022-10-08 DIAGNOSIS — G43009 Migraine without aura, not intractable, without status migrainosus: Secondary | ICD-10-CM | POA: Diagnosis not present

## 2022-10-08 DIAGNOSIS — E559 Vitamin D deficiency, unspecified: Secondary | ICD-10-CM | POA: Diagnosis not present

## 2022-11-30 DIAGNOSIS — R059 Cough, unspecified: Secondary | ICD-10-CM | POA: Diagnosis not present

## 2022-11-30 DIAGNOSIS — R0981 Nasal congestion: Secondary | ICD-10-CM | POA: Diagnosis not present

## 2022-11-30 DIAGNOSIS — R5383 Other fatigue: Secondary | ICD-10-CM | POA: Diagnosis not present

## 2023-03-18 ENCOUNTER — Encounter: Payer: Self-pay | Admitting: Psychiatry

## 2023-03-18 ENCOUNTER — Ambulatory Visit (INDEPENDENT_AMBULATORY_CARE_PROVIDER_SITE_OTHER): Payer: BC Managed Care – PPO | Admitting: Psychiatry

## 2023-03-18 DIAGNOSIS — F331 Major depressive disorder, recurrent, moderate: Secondary | ICD-10-CM | POA: Diagnosis not present

## 2023-03-18 DIAGNOSIS — F411 Generalized anxiety disorder: Secondary | ICD-10-CM | POA: Diagnosis not present

## 2023-03-18 DIAGNOSIS — F5105 Insomnia due to other mental disorder: Secondary | ICD-10-CM

## 2023-03-18 DIAGNOSIS — G251 Drug-induced tremor: Secondary | ICD-10-CM

## 2023-03-18 DIAGNOSIS — R7989 Other specified abnormal findings of blood chemistry: Secondary | ICD-10-CM

## 2023-03-18 DIAGNOSIS — F4001 Agoraphobia with panic disorder: Secondary | ICD-10-CM | POA: Diagnosis not present

## 2023-03-18 DIAGNOSIS — F4010 Social phobia, unspecified: Secondary | ICD-10-CM

## 2023-03-18 DIAGNOSIS — R002 Palpitations: Secondary | ICD-10-CM | POA: Diagnosis not present

## 2023-03-18 DIAGNOSIS — F900 Attention-deficit hyperactivity disorder, predominantly inattentive type: Secondary | ICD-10-CM

## 2023-03-18 DIAGNOSIS — Z79899 Other long term (current) drug therapy: Secondary | ICD-10-CM

## 2023-03-18 MED ORDER — QUETIAPINE FUMARATE 100 MG PO TABS: 100.0000 mg | ORAL_TABLET | Freq: Every day | ORAL | 1 refills | Status: DC

## 2023-03-18 MED ORDER — PROPRANOLOL HCL 20 MG PO TABS: 20.0000 mg | ORAL_TABLET | Freq: Three times a day (TID) | ORAL | 0 refills | Status: DC | PRN

## 2023-03-18 MED ORDER — PAROXETINE HCL 30 MG PO TABS: 30.0000 mg | ORAL_TABLET | Freq: Every day | ORAL | 1 refills | Status: DC

## 2023-03-18 MED ORDER — LITHIUM CARBONATE ER 300 MG PO TBCR: 600.0000 mg | EXTENDED_RELEASE_TABLET | Freq: Every day | ORAL | 1 refills | Status: DC

## 2023-03-18 MED ORDER — LAMOTRIGINE 150 MG PO TABS: 150.0000 mg | ORAL_TABLET | Freq: Two times a day (BID) | ORAL | 1 refills | Status: DC

## 2023-03-18 NOTE — Progress Notes (Signed)
[name] [id number] Jul 03, 1971 52 y.o.  Subjective:   Patient ID:  [name] is a 52 y.o. (DOB Aug 16, 1970) female.  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  At appt Sep 02, 2018. Raised Concerta to 27 mg but then she had anxiety and couple panic and so it was reduced to 18 mg and anxiety resolved.  appointment May 2020.  No meds were changed.  November 2020 appointment with the following noted: Out of the stimulant.  Didn't pursue it as not critical..  Overall OK considering social matters. Concerned about her memory.  Loses track in conversation.  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 Overall pretty good mood.  Still tired in the am but less so with IR Seroquel lower dose..  Some anxiety with Covid.  Wasn't following thoughts to dark and scary places.  Loves being at home so ok. Getting things done and that feels really good.  Put off home schooling until July.  Better mood. Bc tremor she reduced lithium from 1200 to 900 on April 15 and the tremor is better not gone and feels the same.  No SI since here. Still has to nap and rest at times with Concerta but it seemed to help, but hard to explain.  Clarity better.  Easier to make decisions.  Don't feel as daft.  Still hard listening to sermons, drift off all my life.  Is doing better with home schooling.  Still night eating.  Sleep 8-10 hours. Plan restart Concerta and check labs for cognition.  01/20/2020 appointment with the following noted: Patient should have been seen since November before this time. Did not do well with Concerta 18  bc too anxious. Perimenopausal.  Moody.  Some irritability. Struggling a lot with migraine but seems to be getting better.  Gets emotionally overwhelmed but not real dark moods.  Some  sadness. Productivity is better lately. 1 panic since here and thought was having MI.  Anxiety is better now.  Tax season hard. Sleep great with routine.  Fidgety all the time is somewhat embarrassing.  No discomfort. Started diet and less nighteating now over the last couple of weeks.  Plan: For memory complaints check B12 and folate and lamotrigine level.  Also need to check lithium and BMP.  PCP checks thyroid.  10/11/20 appt noted: Pretty good.  Depression under control.  Anxiety is OK unless too much caffeine.   Patient reports stable mood and denies depressed or irritable moods.  Patient denies any recent difficulty with anxiety.  Patient denies difficulty with sleep initiation or maintenance. Denies appetite disturbance.  Patient reports that energy and motivation have been good.  Patient denies any difficulty with concentration.  Patient denies any suicidal ideation. Has a little tremor.   Saw benefit from NAC for cognition but ran out bc difficulty getting it. Still really hungry at night after Seroquel.  Asks about reduction. Plan: Trial propranolol 20-40 mg prn tremor  06/13/21 appt noted: Never tried propranolol. Decreased lithium to 2 tablets at night for 5-6 mos to get rid of tremor and ready to decrease stuff. Wants to decrease more.  Tried to reduce quetiapine from 100 to 50 and couldn't sleep. Sleep 8 hours. Depression under control.  Mild anxiety panic flutters before Scott's surgery and channeled it  into cleaning.[name] doing well now after surgery for diverticulitis. Dr. Richarda Overlie. Plan: OK continue lower dose lithium 600 mg daily but not lower. Check lithium labls Continue quetiapine 50 mg nightly Ok trial reduction in lamotrigine gradually to 150 mg daily over a month. Continue paroxetine 30 mg daily.  01/15/22 appt noted: Feels better after cholecystectomy. Mood is good. Tried reducing lamotrigine to 150 mg daily but more depressed in just a few days and  increased it back to 2 daily. Compliant and no sig SE. Botox helped migraine.  More productive and reliable. Overall depression and anxiety are under control. Had heart flip flop issue a couple of weeks ago without trigger. Also dizzy and went to ER, neg workup.  Out of the blue.  05/15/22 appt noted:   Hasn't gotten lithium level. Heart palpitations again.  Will see cardiologist this month.  It sneaks up on her and can last 90 mins.  Not caused by anxiety. Taking verapamil for htn. No panic.   Increased stress caretaking mother.   Mo won't do much. That has been a problem in the past.  D Sarah in [place].  Pastor left.  Maybe internalizing stress. Trouble getting out of the house or contacting friends.  Not sure why.  Don't want to do anything.  "I guess so", re: overwhelmed. Sleep is good. No SI since here. Plan: OK continue lower dose lithium 600 mg daily but not lower. Checked lithium labls  12/23/21 lithium level 0.43 afternoon on 600 mg HS  Worse with reduction in lamotrigine gradually to 150 mg daily and better on BID Continue paroxetine 30 mg daily, Continue quetiapine 50 HS Better with NAC 1800 mg daily.  For memory complaints continue NAC bc it helped..    09/17/22 appt noted: Meds as above except quetiapine 100 mg HS.  Sleep 8-10 hours but drowsy for 12 hours Botox helped HA Doing very well with mood and anxiety.  Some weeks much better. Noticed on AVS dx of ADHD.  Since read that has felt free and no longer been as hard on myself.  Has been easier on herself. And therefore less negative self talk.  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  Will have trouble focusing at times and get distracted even in conversation. Going to [place] with oldest.  03/18/23 appt noted: More stress anxiety caretaking mother and father.  They live 20 mins away.  M content to lay in bed with depends.  She can walk short distances.   Meds as above without change: lithium 600, paroxetine 30,  Seroquel 100 (better than 50), lamotrigine 150 mg BID. SE tremor Also seeing herself age is disturbing with fear of being like mother.  Heart balpitations and some SOB.   Still has lithium tremor .  Never tried B6 bc forgot. Not dep.   Holter monitor twice without episodes palpitations.  They typical last mins to couple of hours.  Last about once weekly.  Quite a bit of stress at home too.   Still problems with concentration.  Lose interest quickly.  But probably too much phone time.     Past Psychiatric Medication Trials:  Concerta 18 SE anxiety, modafinil 200 side effect, Ritalin, Loxitane 60, Seroquel XR 600, Abilify, Rexulti side effects,  risperidone lamotrigine 300,  lithium, Depakote side effects, buspirone 30 twice daily, gabapentin, topiramate, , propranolol,  imipramine, venlafaxine, sertraline, Lexapro 20 nr, Wellbutrin,  selegiline, duloxetine side effects,  paroxetine 30 temazepam Deplin,   Relpax depression  TMS  Review of Systems:  Negative for weakness, + weight gain, trmor, connstipation , reflux resolved, some HA better with chiropracter, hearing is getting worse. No CP, cough Occ palpitations  Medications: I have reviewed the patient's current medications.  Current Outpatient Medications  Medication Sig Dispense Refill   Acetylcysteine 600 MG CAPS Take 1 capsule (600 mg total) by mouth 2 (two) times daily. 270 capsule 0   b complex vitamins tablet Take 1 tablet by mouth daily.     Docusate Calcium (STOOL SOFTENER PO) Take by mouth 3 (three) times daily.     lamoTRIgine (LAMICTAL) 150 MG tablet Take 1 tablet (150 mg total) by mouth 2 (two) times daily. 180 tablet 1   levothyroxine (SYNTHROID, LEVOTHROID) 125 MCG tablet Take 125 mcg by mouth daily before breakfast.      lithium carbonate (LITHOBID) 300 MG ER tablet Take 2 tablets (600 mg total) by mouth daily. 180 tablet 1   Magnesium 200 MG TABS Take by mouth daily.     Multiple Vitamins-Minerals (WOMENS  MULTIVITAMIN PLUS PO) Take 1 tablet by mouth daily.      PARoxetine (PAXIL) 30 MG tablet Take 1 tablet (30 mg total) by mouth daily. 90 tablet 1   Polyethylene Glycol 3350 (MIRALAX PO) Take by mouth daily.     QUEtiapine (SEROQUEL) 100 MG tablet Take 1 tablet (100 mg total) by mouth at bedtime. 90 tablet 1   verapamil (CALAN-SR) 240 MG CR tablet Take 240 mg by mouth daily.     No current facility-administered medications for this visit.    Medication Side Effects: Other: night eating may be related. , tremor  Allergies:  Allergies  Allergen Reactions   Perphenazine Anaphylaxis and Swelling    Throat swelling and facial muscle contractions     Imipramine Other (See Comments)    Can't remember adverse effect   Penicillins Rash   Albuterol    Other     Phenegran   Codeine Rash    Past Medical History:  Diagnosis Date   Depression    Hashimoto's disease    Kidney stones    Migraine    Palpitations     Family History  Problem Relation Age of Onset   Depression Mother    Migraines Mother    Obesity Mother    Rheum arthritis Sister    Migraines Sister    Hypertension Sister     Social History   Socioeconomic History   Marital status: Married    Spouse name: Acupuncturist   Number of children: 4   Years of education: 14   Highest education level: Some college, no degree  Occupational History   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 She lives with her husband and 4 children in a one level home. She drinks one glass of tea (8-16 oz) a day. She does not exercise. Some college.    Social Determinants of Health   Financial Resource Strain: Not on file  Food Insecurity: Not on file  Transportation Needs: Not on  file  Physical Activity: Not on file  Stress: Not on file  Social Connections: Not on file  Intimate Partner Violence: Not on file    Past Medical History, Surgical history, Social history, and Family history were reviewed and updated as appropriate.   Please see review of systems for further details on the patient's review from today.   Objective:   Physical Exam:  There were no vitals taken for this visit.  Physical Exam Constitutional:      General: She is not in acute distress.    Appearance: She is well-developed.  Musculoskeletal:        General: No deformity.  Neurological:     Mental Status: She is alert and oriented to person, place, and time.     Motor: No tremor.     Coordination: Coordination normal.     Gait: Gait normal.  Psychiatric:        Attention and Perception: She is attentive.        Mood and Affect: Mood is anxious. Mood is not depressed. Affect is not labile, blunt or angry.        Speech: Speech normal.        Behavior: Behavior normal.        Thought Content: Thought content is not paranoid or delusional. Thought content does not include homicidal or suicidal ideation. Thought content does not include suicidal plan.        Cognition and Memory: Cognition normal.        Judgment: Judgment normal.     Comments: Insight and general self-awareness is mildly  impaired chronically but better     Lab Review:     Component Value Date/Time   NA 140 06/03/2022 1449   K 4.3 06/03/2022 1449   CL 104 06/03/2022 1449   CO2 21 06/03/2022 1449   GLUCOSE 81 06/03/2022 1449   GLUCOSE 101 (H) 12/23/2021 1325   BUN 11 06/03/2022 1449   CREATININE 0.93 06/03/2022 1449   CREATININE 0.85 05/10/2020 0940   CALCIUM 9.3 06/03/2022 1449   PROT 7.1 06/03/2022 1449   ALBUMIN 4.4 06/03/2022 1449   AST 21 06/03/2022 1449   ALT 22  06/03/2022 1449   ALKPHOS 113 06/03/2022 1449   BILITOT 0.2 06/03/2022 1449   GFRNONAA >60 12/23/2021 1325   GFRAA >90 08/23/2013 1600       Component Value Date/Time   WBC 9.0 06/03/2022 1449   WBC 6.4 12/23/2021 1325   RBC 4.83 06/03/2022 1449   RBC 5.04 12/23/2021 1325   HGB 14.0 06/03/2022 1449   HCT 42.7 06/03/2022 1449   PLT 310 06/03/2022 1449   MCV 88 06/03/2022 1449   MCH 29.0 06/03/2022 1449   MCH 27.2 12/23/2021 1325   MCHC 32.8 06/03/2022 1449   MCHC 32.5 12/23/2021 1325   RDW 12.1 06/03/2022 1449   LYMPHSABS 2.1 01/17/2011 1812   MONOABS 0.7 01/17/2011 1812   EOSABS 0.1 01/17/2011 1812   BASOSABS 0.0 01/17/2011 1812    Lithium Lvl  Date Value Ref Range Status  06/04/2022 0.5 (L) 0.6 - 1.2 mmol/L Final  12/23/21 lithium level 0.43 afternoon on 600 mg HS    No results found for: "PHENYTOIN", "PHENOBARB", "VALPROATE", "CBMZ"   .res Assessment: Plan:    Major depressive disorder, recurrent episode, moderate (HCC)  Panic disorder with agoraphobia  Generalized anxiety disorder  Social anxiety disorder  Attention deficit hyperactivity disorder (ADHD), predominantly inattentive type  Insomnia due to mental condition  Lithium use  Low vitamin D level   Hx severe TRD with Suicide attempt.  Insight and general self-awareness is moderately impaired chronically complicating and delaying treatment.  Also she has not kept appointments as frequently as recommended.  She is not having panic attacks and overall depression is reasonably managed though her motivation and energy are low.  She is somewhat chronically dysfunctional to some degree. Disc her questions about memory effects of meds.  Consider reduction of lamotrigine if laboratory tests do not show reasons for memory problems.Marland Kitchen  MANAGING quetiapine better.  Discussed potential metabolic side effects associated with atypical antipsychotics, as well as potential risk for movement side effects. Advised pt to  contact office if movement side effects occur.   Counseled patient regarding potential benefits, risks, and side effects of lithium to include potential risk of lithium affecting thyroid and renal function.  Discussed need for periodic lab monitoring to determine drug level and to assess for potential adverse effects.  Counseled patient regarding signs and symptoms of lithium toxicity and advised that they notify office immediately or seek urgent medical attention if experiencing these signs and symptoms.  Patient advised to contact office with any questions or concerns. Lithium level good October2023 0.5  Continue meds: OK continue lower dose lithium 600 mg daily but not lower. Checked lithium labls  12/23/21 lithium level 0.43 afternoon on 600 mg HS  Worse with reduction in lamotrigine gradually to 150 mg daily and better on BID Continue paroxetine 30 mg daily.  Option increase Better with NAC 1800 mg daily.  For memory complaints continue NAC bc it helped..   quetiapine 50-100 mg HS  Propranolol 2 prn tremor or palpitations Constipation managed  Option B6 or propranolol for lithium tremor.  She'll try B6.  Also consider beta blocker for palpitations with neg work up.   Limit caffeine.    Disc alexithymia and previous negative effects and potential negative effects. Supportive therapy caretaking self defeating mother.  Option off label Aricept or Namenda for ADD and cognitive complaints.  Don't change meds on your own DT severe illness in the past.  This appt was 30 mins.  FU 4-6 mos  Meredith Staggers, MD, DFAPA   Please see After Visit Summary for patient specific instructions.  No future appointments.    No orders of the defined types were placed in this encounter.      -------------------------------

## 2023-03-22 ENCOUNTER — Other Ambulatory Visit: Payer: Self-pay | Admitting: Psychiatry

## 2023-03-22 DIAGNOSIS — F4010 Social phobia, unspecified: Secondary | ICD-10-CM

## 2023-03-22 DIAGNOSIS — F411 Generalized anxiety disorder: Secondary | ICD-10-CM

## 2023-03-22 DIAGNOSIS — F4001 Agoraphobia with panic disorder: Secondary | ICD-10-CM

## 2023-03-22 DIAGNOSIS — F331 Major depressive disorder, recurrent, moderate: Secondary | ICD-10-CM

## 2023-04-04 DIAGNOSIS — M2550 Pain in unspecified joint: Secondary | ICD-10-CM | POA: Diagnosis not present

## 2023-04-10 ENCOUNTER — Other Ambulatory Visit: Payer: Self-pay | Admitting: Psychiatry

## 2023-04-10 DIAGNOSIS — G251 Drug-induced tremor: Secondary | ICD-10-CM

## 2023-04-20 ENCOUNTER — Other Ambulatory Visit: Payer: Self-pay | Admitting: Psychiatry

## 2023-04-20 DIAGNOSIS — F5105 Insomnia due to other mental disorder: Secondary | ICD-10-CM

## 2023-04-20 DIAGNOSIS — F411 Generalized anxiety disorder: Secondary | ICD-10-CM

## 2023-04-20 DIAGNOSIS — F331 Major depressive disorder, recurrent, moderate: Secondary | ICD-10-CM

## 2023-04-28 ENCOUNTER — Other Ambulatory Visit: Payer: Self-pay | Admitting: Psychiatry

## 2023-04-28 DIAGNOSIS — G251 Drug-induced tremor: Secondary | ICD-10-CM

## 2023-04-28 NOTE — Telephone Encounter (Signed)
She is requesting a rf. LF 09/12 60 tablets, 1 tablet 3 times daily.  I believe she needs a rf for 90 tablets to last the entire month.  LV 08/20, NV 09/17/22; 90 tablets with 3 rfs, or 270 for 3 month supply to get her to next appt.

## 2023-05-02 DIAGNOSIS — L02413 Cutaneous abscess of right upper limb: Secondary | ICD-10-CM | POA: Diagnosis not present

## 2023-05-02 DIAGNOSIS — M2550 Pain in unspecified joint: Secondary | ICD-10-CM | POA: Diagnosis not present

## 2023-05-02 DIAGNOSIS — R002 Palpitations: Secondary | ICD-10-CM | POA: Diagnosis not present

## 2023-05-02 DIAGNOSIS — L409 Psoriasis, unspecified: Secondary | ICD-10-CM | POA: Diagnosis not present

## 2023-05-05 DIAGNOSIS — H7012 Chronic mastoiditis, left ear: Secondary | ICD-10-CM | POA: Diagnosis not present

## 2023-05-20 DIAGNOSIS — M7989 Other specified soft tissue disorders: Secondary | ICD-10-CM | POA: Diagnosis not present

## 2023-05-20 DIAGNOSIS — R5383 Other fatigue: Secondary | ICD-10-CM | POA: Diagnosis not present

## 2023-05-20 DIAGNOSIS — Z111 Encounter for screening for respiratory tuberculosis: Secondary | ICD-10-CM | POA: Diagnosis not present

## 2023-05-20 DIAGNOSIS — L409 Psoriasis, unspecified: Secondary | ICD-10-CM | POA: Diagnosis not present

## 2023-05-20 DIAGNOSIS — Z79899 Other long term (current) drug therapy: Secondary | ICD-10-CM | POA: Diagnosis not present

## 2023-05-20 DIAGNOSIS — M2559 Pain in other specified joint: Secondary | ICD-10-CM | POA: Diagnosis not present

## 2023-05-21 NOTE — Progress Notes (Deleted)
Cardiology Office Note    Patient Name: [name] Date of Encounter: 05/21/2023  Primary Care Provider:  Daisy Floro, MD Primary Cardiologist:  Donato Schultz, MD Primary Electrophysiologist: None   Past Medical History    Past Medical History:  Diagnosis Date   Depression    Hashimoto's disease    Kidney stones    Migraine    Palpitations     History of Present Illness  [name] is a 52 y.o. female with a PMH of Hashimoto's thyroiditis, depression, anxiety, migraines, palpitations who presents today with complaint of palpitations.  Ms. Patierno was seen initially by Dr. Mayford Knife in 2017 for complaint of palpitations.  She describes the palpitations as a pounding sensation.  She underwent a 2D echo that was normal and wore a 30-day event monitor that showed occasional PACs but was overall benign.  She was seen by Dr. Anne Fu on 06/03/2022 after presenting to the ED on 12/23/2021 with complaint of heart palpitations and fatigue.  She reported feeling lightheaded with palpitations and the urge to cough.  She reports a history of CHF in her mother and was currently being treated with lithium at that time.  She wore a Zio patch for further evaluation that was normal and showed rare PACs and PVCs.  She also underwent an updated 2D echo that showed EF of 60-65% with no RWMA and grade 1 DD with no valvular abnormalities noted.   During today's visit the patient reports*** .  Patient denies chest pain, palpitations, dyspnea, PND, orthopnea, nausea, vomiting, dizziness, syncope, edema, weight gain, or early satiety.  ***Notes: -Last ischemic evaluation: -Last echo: -Interim ED visits: Review of Systems  Please see the history of present illness.    All other systems reviewed and are otherwise negative except as noted above.  Physical Exam    Wt Readings from Last 3 Encounters:  06/03/22 141 lb (64 kg)  08/18/21 145 lb 15.1 oz (66.2 kg)  09/05/20 146 lb (66.2 kg)    JO:ACZYS were no vitals filed for this visit.,There is no height or weight on file to calculate BMI. GEN: Well nourished, well developed in no acute distress Neck: No JVD; No carotid bruits Pulmonary: Clear to auscultation without rales, wheezing or rhonchi  Cardiovascular: Normal rate. Regular rhythm. Normal S1. Normal S2.   Murmurs: There is no murmur.  ABDOMEN: Soft, non-tender, non-distended EXTREMITIES:  No edema; No deformity   EKG/LABS/ Recent Cardiac Studies   ECG personally reviewed by me today - ***  Risk Assessment/Calculations:   {Does this patient have ATRIAL FIBRILLATION?:[phone]}      Lab Results  Component Value Date   WBC 9.0 06/03/2022   HGB 14.0 06/03/2022   HCT 42.7 06/03/2022   MCV 88 06/03/2022   PLT 310 06/03/2022   Lab Results  Component Value Date   CREATININE 0.93 06/03/2022   BUN 11 06/03/2022   NA 140 06/03/2022   K 4.3 06/03/2022   CL 104 06/03/2022   CO2 21 06/03/2022   No results found for: "CHOL", "HDL", "LDLCALC", "LDLDIRECT", "TRIG", "CHOLHDL"  No results found for: "HGBA1C" Assessment & Plan    1.  Palpitations:  2.  Hashimoto's thyroiditis:  3.  Shortness of breath:  4.***      Disposition: Follow-up with Donato Schultz, MD or APP in *** months {Are you ordering a CV Procedure (e.g. stress test, cath, DCCV, TEE, etc)?   Press F2        :[id number]}  Signed, Napoleon Form, Leodis Rains, NP 05/21/2023, 1:02 PM [facility]

## 2023-05-22 ENCOUNTER — Ambulatory Visit: Payer: BC Managed Care – PPO | Admitting: Nurse Practitioner

## 2023-05-22 DIAGNOSIS — R0602 Shortness of breath: Secondary | ICD-10-CM

## 2023-05-22 DIAGNOSIS — R002 Palpitations: Secondary | ICD-10-CM

## 2023-05-22 DIAGNOSIS — E063 Autoimmune thyroiditis: Secondary | ICD-10-CM

## 2023-06-04 DIAGNOSIS — M797 Fibromyalgia: Secondary | ICD-10-CM | POA: Diagnosis not present

## 2023-06-04 DIAGNOSIS — L409 Psoriasis, unspecified: Secondary | ICD-10-CM | POA: Diagnosis not present

## 2023-06-04 DIAGNOSIS — L4059 Other psoriatic arthropathy: Secondary | ICD-10-CM | POA: Diagnosis not present

## 2023-06-05 DIAGNOSIS — H903 Sensorineural hearing loss, bilateral: Secondary | ICD-10-CM | POA: Diagnosis not present

## 2023-06-05 DIAGNOSIS — H9202 Otalgia, left ear: Secondary | ICD-10-CM | POA: Diagnosis not present

## 2023-06-17 DIAGNOSIS — M7061 Trochanteric bursitis, right hip: Secondary | ICD-10-CM | POA: Diagnosis not present

## 2023-09-18 ENCOUNTER — Ambulatory Visit (INDEPENDENT_AMBULATORY_CARE_PROVIDER_SITE_OTHER): Payer: BC Managed Care – PPO | Admitting: Psychiatry

## 2023-09-18 DIAGNOSIS — Z91199 Patient's noncompliance with other medical treatment and regimen due to unspecified reason: Secondary | ICD-10-CM

## 2023-09-18 NOTE — Progress Notes (Signed)
 No show

## 2023-09-19 ENCOUNTER — Other Ambulatory Visit: Payer: Self-pay | Admitting: Psychiatry

## 2023-09-19 DIAGNOSIS — F4010 Social phobia, unspecified: Secondary | ICD-10-CM

## 2023-09-19 DIAGNOSIS — F331 Major depressive disorder, recurrent, moderate: Secondary | ICD-10-CM

## 2023-09-19 DIAGNOSIS — F4001 Agoraphobia with panic disorder: Secondary | ICD-10-CM

## 2023-09-19 DIAGNOSIS — F411 Generalized anxiety disorder: Secondary | ICD-10-CM

## 2023-09-19 NOTE — Telephone Encounter (Signed)
 LVM to schedule f/u

## 2023-09-19 NOTE — Telephone Encounter (Signed)
Please schedule pt an appt. LV 8/20 NS 2/20

## 2023-09-26 DIAGNOSIS — E7800 Pure hypercholesterolemia, unspecified: Secondary | ICD-10-CM | POA: Diagnosis not present

## 2023-09-26 DIAGNOSIS — E063 Autoimmune thyroiditis: Secondary | ICD-10-CM | POA: Diagnosis not present

## 2023-09-26 DIAGNOSIS — E559 Vitamin D deficiency, unspecified: Secondary | ICD-10-CM | POA: Diagnosis not present

## 2023-09-26 DIAGNOSIS — I10 Essential (primary) hypertension: Secondary | ICD-10-CM | POA: Diagnosis not present

## 2023-10-03 ENCOUNTER — Other Ambulatory Visit (HOSPITAL_BASED_OUTPATIENT_CLINIC_OR_DEPARTMENT_OTHER): Payer: Self-pay | Admitting: Family Medicine

## 2023-10-03 DIAGNOSIS — Z0000 Encounter for general adult medical examination without abnormal findings: Secondary | ICD-10-CM | POA: Diagnosis not present

## 2023-10-03 DIAGNOSIS — R109 Unspecified abdominal pain: Secondary | ICD-10-CM | POA: Diagnosis not present

## 2023-10-03 DIAGNOSIS — I10 Essential (primary) hypertension: Secondary | ICD-10-CM | POA: Diagnosis not present

## 2023-10-03 DIAGNOSIS — K219 Gastro-esophageal reflux disease without esophagitis: Secondary | ICD-10-CM | POA: Diagnosis not present

## 2023-10-03 DIAGNOSIS — E063 Autoimmune thyroiditis: Secondary | ICD-10-CM | POA: Diagnosis not present

## 2023-10-08 DIAGNOSIS — Z049 Encounter for examination and observation for unspecified reason: Secondary | ICD-10-CM | POA: Diagnosis not present

## 2023-10-08 DIAGNOSIS — G43719 Chronic migraine without aura, intractable, without status migrainosus: Secondary | ICD-10-CM | POA: Diagnosis not present

## 2023-10-12 ENCOUNTER — Other Ambulatory Visit: Payer: Self-pay | Admitting: Psychiatry

## 2023-10-12 DIAGNOSIS — F331 Major depressive disorder, recurrent, moderate: Secondary | ICD-10-CM

## 2023-10-13 ENCOUNTER — Encounter: Payer: Self-pay | Admitting: Psychiatry

## 2023-10-13 ENCOUNTER — Ambulatory Visit: Admitting: Psychiatry

## 2023-10-13 DIAGNOSIS — F331 Major depressive disorder, recurrent, moderate: Secondary | ICD-10-CM

## 2023-10-13 DIAGNOSIS — F4001 Agoraphobia with panic disorder: Secondary | ICD-10-CM

## 2023-10-13 DIAGNOSIS — R7989 Other specified abnormal findings of blood chemistry: Secondary | ICD-10-CM

## 2023-10-13 DIAGNOSIS — F4010 Social phobia, unspecified: Secondary | ICD-10-CM

## 2023-10-13 DIAGNOSIS — F411 Generalized anxiety disorder: Secondary | ICD-10-CM | POA: Diagnosis not present

## 2023-10-13 DIAGNOSIS — F5105 Insomnia due to other mental disorder: Secondary | ICD-10-CM

## 2023-10-13 DIAGNOSIS — G251 Drug-induced tremor: Secondary | ICD-10-CM

## 2023-10-13 DIAGNOSIS — F900 Attention-deficit hyperactivity disorder, predominantly inattentive type: Secondary | ICD-10-CM

## 2023-10-13 DIAGNOSIS — Z79899 Other long term (current) drug therapy: Secondary | ICD-10-CM

## 2023-10-13 MED ORDER — LITHIUM CARBONATE ER 300 MG PO TBCR: 600.0000 mg | EXTENDED_RELEASE_TABLET | Freq: Every day | ORAL | 1 refills | Status: DC

## 2023-10-13 MED ORDER — QUETIAPINE FUMARATE 100 MG PO TABS: 100.0000 mg | ORAL_TABLET | Freq: Every day | ORAL | 1 refills | Status: DC

## 2023-10-13 MED ORDER — PAROXETINE HCL 40 MG PO TABS: 40.0000 mg | ORAL_TABLET | Freq: Every day | ORAL | 0 refills | Status: DC

## 2023-10-13 MED ORDER — LAMOTRIGINE 150 MG PO TABS: 150.0000 mg | ORAL_TABLET | Freq: Two times a day (BID) | ORAL | 1 refills | Status: DC

## 2023-10-13 NOTE — Patient Instructions (Signed)
 Constipation management 1.  Lots of water 2.  Powdered fiber supplement such as MiraLAX, Citrucel, etc. preferably with a meal 3.  2-4 stool softeners a day 4.  Milk of magnesia or magnesium tablets if needed 5.  Senna tablets

## 2023-10-13 NOTE — Progress Notes (Signed)
 ESMEE FALLAW [id number] 10-Oct-1970 53 y.o.  Subjective:   Patient ID:  NAJLA AUGHENBAUGH is a 53 y.o. (DOB [birth year]) female.  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  At appt Sep 02, 2018. Raised Concerta to 27 mg but then she had anxiety and couple panic and so it was reduced to 18 mg and anxiety resolved.  appointment May 2020.  No meds were changed.  November 2020 appointment with the following noted: Out of the stimulant.  Didn't pursue it as not critical..  Overall OK considering social matters. Concerned about her memory.  Loses track in conversation.  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 Overall pretty good mood.  Still tired in the am but less so with IR Seroquel lower dose..  Some anxiety with Covid.  Wasn't following thoughts to dark and scary places.  Loves being at home so ok. Getting things done and that feels really good.  Put off home schooling until July.  Better mood. Bc tremor she reduced lithium from 1200 to 900 on April 15 and the tremor is better not gone and feels the same.  No SI since here. Still has to nap and rest at times with Concerta but it seemed to help, but hard to explain.  Clarity better.  Easier to make decisions.  Don't feel as daft.  Still hard listening to sermons, drift off all my life.  Is doing better with home schooling.  Still night eating.  Sleep 8-10 hours. Plan restart Concerta and check labs for cognition.  01/20/2020 appointment with the following noted: Patient should have been seen since November before this time. Did not do well with Concerta 18  bc too anxious. Perimenopausal.  Moody.  Some irritability. Struggling a lot with migraine but seems to be getting better.  Gets emotionally overwhelmed but not real dark moods.  Some  sadness. Productivity is better lately. 1 panic since here and thought was having MI.  Anxiety is better now.  Tax season hard. Sleep great with routine.  Fidgety all the time is somewhat embarrassing.  No discomfort. Started diet and less nighteating now over the last couple of weeks.  Plan: For memory complaints check B12 and folate and lamotrigine level.  Also need to check lithium and BMP.  PCP checks thyroid.  10/11/20 appt noted: Pretty good.  Depression under control.  Anxiety is OK unless too much caffeine.   Patient reports stable mood and denies depressed or irritable moods.  Patient denies any recent difficulty with anxiety.  Patient denies difficulty with sleep initiation or maintenance. Denies appetite disturbance.  Patient reports that energy and motivation have been good.  Patient denies any difficulty with concentration.  Patient denies any suicidal ideation. Has a little tremor.   Saw benefit from NAC for cognition but ran out bc difficulty getting it. Still really hungry at night after Seroquel.  Asks about reduction. Plan: Trial propranolol 20-40 mg prn tremor  06/13/21 appt noted: Never tried propranolol. Decreased lithium to 2 tablets at night for 5-6 mos to get rid of tremor and ready to decrease stuff. Wants to decrease more.  Tried to reduce quetiapine from 100 to 50 and couldn't sleep. Sleep 8 hours. Depression under control.  Mild anxiety panic flutters before Scott's surgery and channeled it  into cleaning.Lorin Picket doing well now after surgery for diverticulitis. Dr. Richarda Overlie. Plan: OK continue lower dose lithium 600 mg daily but not lower. Check lithium labls Continue quetiapine 50 mg nightly Ok trial reduction in lamotrigine gradually to 150 mg daily over a month. Continue paroxetine 30 mg daily.  01/15/22 appt noted: Feels better after cholecystectomy. Mood is good. Tried reducing lamotrigine to 150 mg daily but more depressed in just a few days and  increased it back to 2 daily. Compliant and no sig SE. Botox helped migraine.  More productive and reliable. Overall depression and anxiety are under control. Had heart flip flop issue a couple of weeks ago without trigger. Also dizzy and went to ER, neg workup.  Out of the blue.  05/15/22 appt noted:   Hasn't gotten lithium level. Heart palpitations again.  Will see cardiologist this month.  It sneaks up on her and can last 90 mins.  Not caused by anxiety. Taking verapamil for htn. No panic.   Increased stress caretaking mother.   Mo won't do much. That has been a problem in the past.  D Sarah in [place].  Pastor left.  Maybe internalizing stress. Trouble getting out of the house or contacting friends.  Not sure why.  Don't want to do anything.  "I guess so", re: overwhelmed. Sleep is good. No SI since here. Plan: OK continue lower dose lithium 600 mg daily but not lower. Checked lithium labls  12/23/21 lithium level 0.43 afternoon on 600 mg HS  Worse with reduction in lamotrigine gradually to 150 mg daily and better on BID Continue paroxetine 30 mg daily, Continue quetiapine 50 HS Better with NAC 1800 mg daily.  For memory complaints continue NAC bc it helped..    09/17/22 appt noted: Meds as above except quetiapine 100 mg HS.  Sleep 8-10 hours but drowsy for 12 hours Botox helped HA Doing very well with mood and anxiety.  Some weeks much better. Noticed on AVS dx of ADHD.  Since read that has felt free and no longer been as hard on myself.  Has been easier on herself. And therefore less negative self talk.  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  Will have trouble focusing at times and get distracted even in conversation. Going to [place] with oldest.  03/18/23 appt noted: More stress anxiety caretaking mother and father.  They live 20 mins away.  M content to lay in bed with depends.  She can walk short distances.   Meds as above without change: lithium 600, paroxetine 30,  Seroquel 100 (better than 50), lamotrigine 150 mg BID. SE tremor Also seeing herself age is disturbing with fear of being like mother.  Heart balpitations and some SOB.   Still has lithium tremor .  Never tried B6 bc forgot. Not dep.   Holter monitor twice without episodes palpitations.  They typical last mins to couple of hours.  Last about once weekly.  Quite a bit of stress at home too.   Still problems with concentration.  Lose interest quickly.  But probably too much phone time.    09/18/23 no show  10/13/23 appt noted: Meds as above without change: lithium ER 600, paroxetine 30, Seroquel 100 (better than 50), lamotrigine 150 mg BID.  ? NAC, no propranolol 20 TID prn. Not good.  M stroke end Oct but things changed.  Bad off mentally with dementia.  This incr pt's anxiety and then dep.  Was going up there 3 times  weekly helping father.  Now with HA is not going to help.  M sweeter after the stroke.  No longer guilt tripping.   Pt doing well until Mid Jan and increase migraine half the time. Easily overwhelmed.  More dep and anxiety.  If couple things going on the fall aprt. Put on wt craving carbs and sugar.   Just saw Dr. Tenny Craw for PE Guilt spiral over failing parents, family, church, letting people down over migraine.   Botox had helped but now back to back migraine.   Just started HA wellness [facility]. No SI. Sleep great and loves Seroquel.  Sleeps normally 10+ hours.  Trouble with time change  Past Psychiatric Medication Trials:  Concerta 18 SE anxiety, modafinil 200 side effect, Ritalin, Loxitane 60, Seroquel XR 600, Abilify, Rexulti side effects,  risperidone lamotrigine 300,  lithium, Depakote side effects, buspirone 30 twice daily, gabapentin, topiramate, , propranolol,  imipramine, venlafaxine, sertraline, Lexapro 20 nr, Wellbutrin,  selegiline, duloxetine side effects,  paroxetine 30 temazepam Deplin,   Relpax depression  TMS   Review of Systems:  Negative for weakness, +  weight gain, trmor, connstipation , reflux resolved, some HA better with chiropracter, hearing is getting worse. No CP, cough Occ palpitations Ongoing wt concerns  Medications: I have reviewed the patient's current medications.  Current Outpatient Medications  Medication Sig Dispense Refill   Acetylcysteine 600 MG CAPS Take 1 capsule (600 mg total) by mouth 2 (two) times daily. 270 capsule 0   b complex vitamins tablet Take 1 tablet by mouth daily.     baclofen (LIORESAL) 10 MG tablet Take 10 mg by mouth 3 (three) times daily.     Docusate Calcium (STOOL SOFTENER PO) Take by mouth 3 (three) times daily.     levothyroxine (SYNTHROID, LEVOTHROID) 125 MCG tablet Take 125 mcg by mouth daily before breakfast.      Magnesium 200 MG TABS Take by mouth daily.     Multiple Vitamins-Minerals (WOMENS MULTIVITAMIN PLUS PO) Take 1 tablet by mouth daily.      Polyethylene Glycol 3350 (MIRALAX PO) Take by mouth daily.     verapamil (CALAN-SR) 240 MG CR tablet Take 240 mg by mouth daily.     zonisamide (ZONEGRAN) 25 MG capsule Take 100 mg by mouth at bedtime.     lamoTRIgine (LAMICTAL) 150 MG tablet Take 1 tablet (150 mg total) by mouth 2 (two) times daily. 180 tablet 1   lithium carbonate (LITHOBID) 300 MG ER tablet Take 2 tablets (600 mg total) by mouth daily. 180 tablet 1   PARoxetine (PAXIL) 40 MG tablet Take 1 tablet (40 mg total) by mouth daily. 90 tablet 0   QUEtiapine (SEROQUEL) 100 MG tablet Take 1 tablet (100 mg total) by mouth at bedtime. 90 tablet 1   No current facility-administered medications for this visit.    Medication Side Effects: Other: night eating may be related. , tremor  Allergies:  Allergies  Allergen Reactions   Perphenazine Anaphylaxis and Swelling    Throat swelling and facial muscle contractions     Imipramine Other (See Comments)    Can't remember adverse effect   Penicillins Rash   Albuterol    Other     Phenegran   Codeine Rash    Past Medical History:   Diagnosis Date   Depression    Hashimoto's disease    Kidney stones    Migraine    Palpitations     Family History  Problem Relation Age of Onset  Depression Mother    Migraines Mother    Obesity Mother    Rheum arthritis Sister    Migraines Sister    Hypertension Sister     Social History   Socioeconomic History   Marital status: Married    Spouse name: Acupuncturist   Number of children: 4   Years of education: 14   Highest education level: Some college, no degree  Occupational History   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 She lives with her husband and 4 children in a one level home. She drinks one glass of tea (8-16 oz) a day. She does not exercise. Some college.   Social Drivers of Corporate investment banker Strain: Not on file  Food Insecurity: Not on file  Transportation Needs: Not on file  Physical Activity: Not on file  Stress: Not on file  Social Connections: Not on file  Intimate Partner Violence: Not on file    Past Medical History, Surgical history, Social history, and Family history were reviewed and updated as appropriate.   Please see review of systems for further details on the patient's review from today.   Objective:   Physical Exam:  There were no vitals taken for this visit.  Physical Exam Constitutional:      General: She is not in acute distress.    Appearance: She is well-developed.  Musculoskeletal:        General: No deformity.  Neurological:     Mental Status: She is alert and oriented to person, place, and time.     Motor: No tremor.      Coordination: Coordination normal.     Gait: Gait normal.  Psychiatric:        Attention and Perception: She is attentive.        Mood and Affect: Mood is anxious and depressed. Affect is tearful. Affect is not labile, blunt or angry.        Speech: Speech normal.        Behavior: Behavior normal.        Thought Content: Thought content is not paranoid or delusional. Thought content does not include homicidal or suicidal ideation. Thought content does not include suicidal plan.        Cognition and Memory: Cognition normal.        Judgment: Judgment normal.     Comments: Insight and general self-awareness is mildly  impaired chronically and worse in last couple mos with dep and anxiety.  Tearful over talking about mother Denies any SI     Lab Review:     Component Value Date/Time   NA 140 06/03/2022 1449   K 4.3 06/03/2022 1449   CL 104 06/03/2022 1449   CO2 21 06/03/2022 1449   GLUCOSE 81 06/03/2022 1449   GLUCOSE 101 (H) 12/23/2021 1325   BUN 11 06/03/2022 1449   CREATININE 0.93 06/03/2022 1449   CREATININE 0.85 05/10/2020 0940   CALCIUM 9.3 06/03/2022 1449   PROT 7.1 06/03/2022 1449   ALBUMIN 4.4 06/03/2022  1449   AST 21 06/03/2022 1449   ALT 22 06/03/2022 1449   ALKPHOS 113 06/03/2022 1449   BILITOT 0.2 06/03/2022 1449   GFRNONAA >60 12/23/2021 1325   GFRAA >90 08/23/2013 1600       Component Value Date/Time   WBC 9.0 06/03/2022 1449   WBC 6.4 12/23/2021 1325   RBC 4.83 06/03/2022 1449   RBC 5.04 12/23/2021 1325   HGB 14.0 06/03/2022 1449   HCT 42.7 06/03/2022 1449   PLT 310 06/03/2022 1449   MCV 88 06/03/2022 1449   MCH 29.0 06/03/2022 1449   MCH 27.2 12/23/2021 1325   MCHC 32.8 06/03/2022 1449   MCHC 32.5 12/23/2021 1325   RDW 12.1 06/03/2022 1449   LYMPHSABS 2.1 01/17/2011 1812   MONOABS 0.7 01/17/2011 1812   EOSABS 0.1 01/17/2011 1812   BASOSABS 0.0 01/17/2011 1812    Lithium Lvl  Date Value Ref Range Status  06/04/2022 0.5 (L) 0.6 - 1.2  mmol/L Final  12/23/21 lithium level 0.43 afternoon on 600 mg HS  09/26/23 Cr 1.16, normal calcium    No results found for: "PHENYTOIN", "PHENOBARB", "VALPROATE", "CBMZ"   .res Assessment: Plan:    Major depressive disorder, recurrent episode, moderate (HCC) - Plan: PARoxetine (PAXIL) 40 MG tablet, lamoTRIgine (LAMICTAL) 150 MG tablet, lithium carbonate (LITHOBID) 300 MG ER tablet, QUEtiapine (SEROQUEL) 100 MG tablet  Panic disorder with agoraphobia - Plan: PARoxetine (PAXIL) 40 MG tablet  Generalized anxiety disorder - Plan: PARoxetine (PAXIL) 40 MG tablet, QUEtiapine (SEROQUEL) 100 MG tablet  Social anxiety disorder - Plan: PARoxetine (PAXIL) 40 MG tablet  Attention deficit hyperactivity disorder (ADHD), predominantly inattentive type  Insomnia due to mental condition - Plan: QUEtiapine (SEROQUEL) 100 MG tablet  Lithium use  Low vitamin D level  Lithium-induced tremor   Hx severe TRD with Suicide attempt.  Insight and general self-awareness is moderately impaired chronically complicating and delaying treatment.  Also she has not kept appointments as frequently as recommended.  She is not having panic attacks and overall depression is reasonably managed though her motivation and energy are low.  She is somewhat chronically dysfunctional to some degree. Disc her questions about memory effects of meds.  Consider reduction of lamotrigine if laboratory tests do not show reasons for memory problems.Marland Kitchen  MANAGING quetiapine better.  Discussed potential metabolic side effects associated with atypical antipsychotics, as well as potential risk for movement side effects. Advised pt to contact office if movement side effects occur.   Counseled patient regarding potential benefits, risks, and side effects of lithium to include potential risk of lithium affecting thyroid and renal function.  Discussed need for periodic lab monitoring to determine drug level and to assess for potential adverse  effects.  Counseled patient regarding signs and symptoms of lithium toxicity and advised that they notify office immediately or seek urgent medical attention if experiencing these signs and symptoms.  Patient advised to contact office with any questions or concerns. Lithium level good October2023 0.5  Continue meds: OK continue lower dose lithium 600 mg daily but not lower.  She's consistent. Checked lithium labls  12/23/21 lithium level 0.43 afternoon on 600 mg HS  Worse with reduction in lamotrigine gradually to 150 mg daily and better on BID  Increase  for dep and anxiety paroxetine 40 mg daily.   Was Better with NAC 1800 mg daily.  For memory complaints continue NAC bc it helped..   quetiapine 50-100 mg HS   Constipation worse  Counseling 20 min:  guilt issues.  Disc alexithymia and previous negative effects and potential negative effects.  Disc stressors and poor management lately with worsening HA as primary or secondary to mental health. Supportive therapy caretaking self defeating mother. Started Teacher, music.  Constipation management 1.  Lots of water 2.  Powdered fiber supplement such as MiraLAX, Citrucel, etc. preferably with a meal 3.  2-4 stool softeners a day 4.  Milk of magnesia or magnesium tablets if needed 5.  Senna tablets  Don't change meds on your own DT severe illness in the past.  This appt was 30 mins.  FU 8 weeks  Meredith Staggers, MD, DFAPA   Please see After Visit Summary for patient specific instructions.  Future Appointments  Date Time Provider Department Center  11/19/2023  8:00 AM [facility]      No orders of the defined types were placed in this encounter.      -------------------------------

## 2023-10-14 DIAGNOSIS — M542 Cervicalgia: Secondary | ICD-10-CM | POA: Diagnosis not present

## 2023-10-14 DIAGNOSIS — Z79899 Other long term (current) drug therapy: Secondary | ICD-10-CM | POA: Diagnosis not present

## 2023-10-14 DIAGNOSIS — G43719 Chronic migraine without aura, intractable, without status migrainosus: Secondary | ICD-10-CM | POA: Diagnosis not present

## 2023-10-16 ENCOUNTER — Other Ambulatory Visit: Payer: Self-pay | Admitting: Psychiatry

## 2023-10-16 DIAGNOSIS — F4010 Social phobia, unspecified: Secondary | ICD-10-CM

## 2023-10-16 DIAGNOSIS — F411 Generalized anxiety disorder: Secondary | ICD-10-CM

## 2023-10-16 DIAGNOSIS — F4001 Agoraphobia with panic disorder: Secondary | ICD-10-CM

## 2023-10-16 DIAGNOSIS — F331 Major depressive disorder, recurrent, moderate: Secondary | ICD-10-CM

## 2023-10-20 ENCOUNTER — Ambulatory Visit: Payer: BC Managed Care – PPO | Admitting: Psychiatry

## 2023-10-21 ENCOUNTER — Encounter: Payer: Self-pay | Admitting: Psychiatry

## 2023-11-04 DIAGNOSIS — G43719 Chronic migraine without aura, intractable, without status migrainosus: Secondary | ICD-10-CM | POA: Diagnosis not present

## 2023-11-04 DIAGNOSIS — M542 Cervicalgia: Secondary | ICD-10-CM | POA: Diagnosis not present

## 2023-11-19 ENCOUNTER — Ambulatory Visit (HOSPITAL_BASED_OUTPATIENT_CLINIC_OR_DEPARTMENT_OTHER)
Admission: RE | Admit: 2023-11-19 | Discharge: 2023-11-19 | Disposition: A | Discharge status: Home / Self Care | Payer: Self-pay | Source: Ambulatory Visit | Attending: Family Medicine | Admitting: Family Medicine

## 2023-11-19 DIAGNOSIS — G43719 Chronic migraine without aura, intractable, without status migrainosus: Secondary | ICD-10-CM | POA: Diagnosis not present

## 2023-11-19 DIAGNOSIS — Z0000 Encounter for general adult medical examination without abnormal findings: Secondary | ICD-10-CM | POA: Insufficient documentation

## 2023-12-03 DIAGNOSIS — G43719 Chronic migraine without aura, intractable, without status migrainosus: Secondary | ICD-10-CM | POA: Diagnosis not present

## 2023-12-03 DIAGNOSIS — M542 Cervicalgia: Secondary | ICD-10-CM | POA: Diagnosis not present

## 2023-12-18 DIAGNOSIS — G43719 Chronic migraine without aura, intractable, without status migrainosus: Secondary | ICD-10-CM | POA: Diagnosis not present

## 2023-12-18 DIAGNOSIS — M542 Cervicalgia: Secondary | ICD-10-CM | POA: Diagnosis not present

## 2023-12-24 ENCOUNTER — Ambulatory Visit: Admitting: Psychiatry

## 2024-01-01 DIAGNOSIS — G43719 Chronic migraine without aura, intractable, without status migrainosus: Secondary | ICD-10-CM | POA: Diagnosis not present

## 2024-01-01 DIAGNOSIS — M542 Cervicalgia: Secondary | ICD-10-CM | POA: Diagnosis not present

## 2024-01-11 ENCOUNTER — Other Ambulatory Visit: Payer: Self-pay | Admitting: Psychiatry

## 2024-01-11 DIAGNOSIS — F4001 Agoraphobia with panic disorder: Secondary | ICD-10-CM

## 2024-01-11 DIAGNOSIS — F411 Generalized anxiety disorder: Secondary | ICD-10-CM

## 2024-01-11 DIAGNOSIS — F331 Major depressive disorder, recurrent, moderate: Secondary | ICD-10-CM

## 2024-01-11 DIAGNOSIS — F4010 Social phobia, unspecified: Secondary | ICD-10-CM

## 2024-02-12 DIAGNOSIS — M542 Cervicalgia: Secondary | ICD-10-CM | POA: Diagnosis not present

## 2024-02-12 DIAGNOSIS — G43719 Chronic migraine without aura, intractable, without status migrainosus: Secondary | ICD-10-CM | POA: Diagnosis not present

## 2024-02-23 ENCOUNTER — Ambulatory Visit (INDEPENDENT_AMBULATORY_CARE_PROVIDER_SITE_OTHER): Payer: Self-pay | Admitting: Psychiatry

## 2024-02-23 DIAGNOSIS — Z91199 Patient's noncompliance with other medical treatment and regimen due to unspecified reason: Secondary | ICD-10-CM

## 2024-02-25 NOTE — Progress Notes (Signed)
 No show

## 2024-03-06 ENCOUNTER — Other Ambulatory Visit: Payer: Self-pay | Admitting: Psychiatry

## 2024-03-06 DIAGNOSIS — F331 Major depressive disorder, recurrent, moderate: Secondary | ICD-10-CM

## 2024-03-06 DIAGNOSIS — F4010 Social phobia, unspecified: Secondary | ICD-10-CM

## 2024-03-06 DIAGNOSIS — F411 Generalized anxiety disorder: Secondary | ICD-10-CM

## 2024-03-06 DIAGNOSIS — F4001 Agoraphobia with panic disorder: Secondary | ICD-10-CM

## 2024-03-15 DIAGNOSIS — M546 Pain in thoracic spine: Secondary | ICD-10-CM | POA: Diagnosis not present

## 2024-03-16 DIAGNOSIS — M546 Pain in thoracic spine: Secondary | ICD-10-CM | POA: Diagnosis not present

## 2024-03-24 DIAGNOSIS — M546 Pain in thoracic spine: Secondary | ICD-10-CM | POA: Diagnosis not present

## 2024-03-25 DIAGNOSIS — G43719 Chronic migraine without aura, intractable, without status migrainosus: Secondary | ICD-10-CM | POA: Diagnosis not present

## 2024-03-25 DIAGNOSIS — M542 Cervicalgia: Secondary | ICD-10-CM | POA: Diagnosis not present

## 2024-04-10 ENCOUNTER — Other Ambulatory Visit: Payer: Self-pay | Admitting: Psychiatry

## 2024-04-10 DIAGNOSIS — F331 Major depressive disorder, recurrent, moderate: Secondary | ICD-10-CM

## 2024-05-06 DIAGNOSIS — G43719 Chronic migraine without aura, intractable, without status migrainosus: Secondary | ICD-10-CM | POA: Diagnosis not present

## 2024-05-06 DIAGNOSIS — M542 Cervicalgia: Secondary | ICD-10-CM | POA: Diagnosis not present

## 2024-05-07 DIAGNOSIS — M79641 Pain in right hand: Secondary | ICD-10-CM | POA: Diagnosis not present

## 2024-05-07 DIAGNOSIS — M79642 Pain in left hand: Secondary | ICD-10-CM | POA: Diagnosis not present

## 2024-05-09 ENCOUNTER — Other Ambulatory Visit: Payer: Self-pay | Admitting: Psychiatry

## 2024-05-09 DIAGNOSIS — F411 Generalized anxiety disorder: Secondary | ICD-10-CM

## 2024-05-09 DIAGNOSIS — F4010 Social phobia, unspecified: Secondary | ICD-10-CM

## 2024-05-09 DIAGNOSIS — F4001 Agoraphobia with panic disorder: Secondary | ICD-10-CM

## 2024-05-09 DIAGNOSIS — F331 Major depressive disorder, recurrent, moderate: Secondary | ICD-10-CM

## 2024-05-21 DIAGNOSIS — K219 Gastro-esophageal reflux disease without esophagitis: Secondary | ICD-10-CM | POA: Diagnosis not present

## 2024-05-21 DIAGNOSIS — R053 Chronic cough: Secondary | ICD-10-CM | POA: Diagnosis not present

## 2024-06-07 ENCOUNTER — Encounter: Payer: Self-pay | Admitting: Psychiatry

## 2024-06-07 ENCOUNTER — Ambulatory Visit: Admitting: Psychiatry

## 2024-06-07 DIAGNOSIS — F4001 Agoraphobia with panic disorder: Secondary | ICD-10-CM

## 2024-06-07 DIAGNOSIS — Z79899 Other long term (current) drug therapy: Secondary | ICD-10-CM

## 2024-06-07 DIAGNOSIS — F331 Major depressive disorder, recurrent, moderate: Secondary | ICD-10-CM

## 2024-06-07 DIAGNOSIS — F5105 Insomnia due to other mental disorder: Secondary | ICD-10-CM

## 2024-06-07 DIAGNOSIS — F4010 Social phobia, unspecified: Secondary | ICD-10-CM

## 2024-06-07 DIAGNOSIS — F900 Attention-deficit hyperactivity disorder, predominantly inattentive type: Secondary | ICD-10-CM

## 2024-06-07 DIAGNOSIS — F411 Generalized anxiety disorder: Secondary | ICD-10-CM

## 2024-06-07 MED ORDER — QUETIAPINE FUMARATE 100 MG PO TABS: 100.0000 mg | ORAL_TABLET | Freq: Every day | ORAL | 1 refills | Status: AC

## 2024-06-07 MED ORDER — LITHIUM CARBONATE ER 300 MG PO TBCR: 600.0000 mg | EXTENDED_RELEASE_TABLET | Freq: Every day | ORAL | 0 refills | Status: DC

## 2024-06-07 MED ORDER — PAROXETINE HCL 40 MG PO TABS: 40.0000 mg | ORAL_TABLET | Freq: Every day | ORAL | 1 refills | Status: AC

## 2024-06-07 NOTE — Patient Instructions (Addendum)
 Add B12 supplement  Rec DBT (dialectical behavior therapy)   Get lithium  level ASAP

## 2024-06-07 NOTE — Progress Notes (Signed)
 KEEGAN BENSCH [id number] 06/21/1971 53 y.o.  Subjective:   Patient ID:  NYARI OLSSON is a 54 y.o. (DOB 12/07/1970) female.  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  At appt Sep 02, 2018. Raised Concerta  to 27 mg but then she had anxiety and couple panic and so it was reduced to 18 mg and anxiety resolved.  appointment May 2020.  No meds were changed.  November 2020 appointment with the following noted: Out of the stimulant.  Didn't pursue it as not critical..  Overall OK considering social matters. Concerned about her memory.  Loses track in conversation.  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 Overall pretty good mood.  Still tired in the am but less so with IR Seroquel  lower dose..  Some anxiety with Covid.  Wasn't following thoughts to dark and scary places.  Loves being at home so ok. Getting things done and that feels really good.  Put off home schooling until July.  Better mood. Bc tremor she reduced lithium  from 1200 to 900 on April 15 and the tremor is better not gone and feels the same.  No SI since here. Still has to nap and rest at times with Concerta  but it seemed to help, but hard to explain.  Clarity better.  Easier to make decisions.  Don't feel as daft.  Still hard listening to sermons, drift off all my life.  Is doing better with home schooling.  Still night eating.  Sleep 8-10 hours. Plan restart Concerta  and check labs for cognition.  01/20/2020 appointment with the following noted: Patient should have been seen since November before this time. Did not do well with Concerta  18  bc too anxious. Perimenopausal.  Moody.  Some irritability. Struggling a lot with migraine but seems to be getting better.  Gets emotionally overwhelmed but not real dark moods.  Some  sadness. Productivity is better lately. 1 panic since here and thought was having MI.  Anxiety is better now.  Tax season hard. Sleep great with routine.  Fidgety all the time is somewhat embarrassing.  No discomfort. Started diet and less nighteating now over the last couple of weeks.  Plan: For memory complaints check B12 and folate and lamotrigine  level.  Also need to check lithium  and BMP.  PCP checks thyroid .  10/11/20 appt noted: Pretty good.  Depression under control.  Anxiety is OK unless too much caffeine.   Patient reports stable mood and denies depressed or irritable moods.  Patient denies any recent difficulty with anxiety.  Patient denies difficulty with sleep initiation or maintenance. Denies appetite disturbance.  Patient reports that energy and motivation have been good.  Patient denies any difficulty with concentration.  Patient denies any suicidal ideation. Has a little tremor.   Saw benefit from NAC for cognition but ran out bc difficulty getting it. Still really hungry at night after Seroquel .  Asks about reduction. Plan: Trial propranolol  20-40 mg prn tremor  06/13/21 appt noted: Never tried propranolol . Decreased lithium  to 2 tablets at night for 5-6 mos to get rid of tremor and ready to decrease stuff. Wants to decrease more.  Tried to reduce quetiapine  from 100 to 50 and couldn't sleep. Sleep 8 hours. Depression under control.  Mild anxiety panic flutters before Scott's surgery and channeled it  into cleaning.SABRA Hamilton doing well now after surgery for diverticulitis. Dr. Mabel essex. Plan: OK continue lower dose lithium  600 mg daily but not lower. Check lithium  labls Continue quetiapine  50 mg nightly Ok trial reduction in lamotrigine  gradually to 150 mg daily over a month. Continue paroxetine  30 mg daily.  01/15/22 appt noted: Feels better after cholecystectomy. Mood is good. Tried reducing lamotrigine  to 150 mg daily but more depressed in just a few days and  increased it back to 2 daily. Compliant and no sig SE. Botox helped migraine.  More productive and reliable. Overall depression and anxiety are under control. Had heart flip flop issue a couple of weeks ago without trigger. Also dizzy and went to ER, neg workup.  Out of the blue.  05/15/22 appt noted:   Hasn't gotten lithium  level. Heart palpitations again.  Will see cardiologist this month.  It sneaks up on her and can last 90 mins.  Not caused by anxiety. Taking verapamil  for htn. No panic.   Increased stress caretaking mother.   Mo won't do much. That has been a problem in the past.  D Sarah in [place].  Pastor left.  Maybe internalizing stress. Trouble getting out of the house or contacting friends.  Not sure why.  Don't want to do anything.  I guess so, re: overwhelmed. Sleep is good. No SI since here. Plan: OK continue lower dose lithium  600 mg daily but not lower. Checked lithium  labls  12/23/21 lithium  level 0.43 afternoon on 600 mg HS  Worse with reduction in lamotrigine  gradually to 150 mg daily and better on BID Continue paroxetine  30 mg daily, Continue quetiapine  50 HS Better with NAC 1800 mg daily.  For memory complaints continue NAC bc it helped..    09/17/22 appt noted: Meds as above except quetiapine  100 mg HS.  Sleep 8-10 hours but drowsy for 12 hours Botox helped HA Doing very well with mood and anxiety.  Some weeks much better. Noticed on AVS dx of ADHD.  Since read that has felt free and no longer been as hard on myself.  Has been easier on herself. And therefore less negative self talk.  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  Will have trouble focusing at times and get distracted even in conversation. Going to [place] with oldest.  03/18/23 appt noted: More stress anxiety caretaking mother and father.  They live 20 mins away.  M content to lay in bed with depends.  She can walk short distances.   Meds as above without change: lithium  600, paroxetine  30,  Seroquel  100 (better than 50), lamotrigine  150 mg BID. SE tremor Also seeing herself age is disturbing with fear of being like mother.  Heart balpitations and some SOB.   Still has lithium  tremor .  Never tried B6 bc forgot. Not dep.   Holter monitor twice without episodes palpitations.  They typical last mins to couple of hours.  Last about once weekly.  Quite a bit of stress at home too.   Still problems with concentration.  Lose interest quickly.  But probably too much phone time.    09/18/23 no show  10/13/23 appt noted: Meds as above without change: lithium  ER 600, paroxetine  30, Seroquel  100 (better than 50), lamotrigine  150 mg BID.  ? NAC, no propranolol  20 TID prn. Not good.  M stroke end Oct but things changed.  Bad off mentally with dementia.  This incr pt's anxiety and then dep.  Was going up there 3 times  weekly helping father.  Now with HA is not going to help.  M sweeter after the stroke.  No longer guilt tripping.   Pt doing well until Mid Jan and increase migraine half the time. Easily overwhelmed.  More dep and anxiety.  If couple things going on the fall aprt. Put on wt craving carbs and sugar.   Just saw Dr. Okey for PE Guilt spiral over failing parents, family, church, letting people down over migraine.   Botox had helped but now back to back migraine.   Just started HA wellness CTR. No SI. Sleep great and loves Seroquel .  Sleeps normally 10+ hours.  Trouble with time change Plan: Increase  for dep and anxiety paroxetine  40 mg daily.   06/07/24 appt noted:  Meds as above without change: lithium  ER 600, paroxetine  40, Seroquel  100 (better than 50), lamotrigine  150 mg BID.  ? NAC,  Better with incr paroxetine  40.  Able to handle stress more normally.  Remodedled kitchen and building addition.  Bout with bed bugs.  Grateful to [employer] for sustaining her. Flare up FM and dx psoriatic arthritis.  Some migraine affecting mood. Less negative self talk. Has Neuro to tx HA.  Dr.  Oneita. Oldest son dx borderline PD and 96 yo.  All kids live at home.  He is in online counseling.     Past Psychiatric Medication Trials:  Concerta  18 SE anxiety, modafinil 200 side effect, Ritalin , Loxitane 60, Seroquel  XR 600, Abilify , Rexulti side effects,  risperidone lamotrigine  300,  lithium , Depakote side effects, buspirone  30 twice daily, gabapentin , topiramate, , propranolol ,  imipramine, venlafaxine, sertraline, Lexapro 20 nr, Wellbutrin ,  selegiline, duloxetine side effects,  paroxetine  30 temazepam Deplin,   Relpax depression  TMS   Review of Systems:  Negative for weakness, + weight gain, trmor, connstipation , reflux resolved, some HA better with chiropracter, hearing is getting worse. No CP, cough Occ palpitations Ongoing wt concerns  Medications: I have reviewed the patient's current medications.  Current Outpatient Medications  Medication Sig Dispense Refill   Acetylcysteine  600 MG CAPS Take 1 capsule (600 mg total) by mouth 2 (two) times daily. 270 capsule 0   b complex vitamins tablet Take 1 tablet by mouth daily.     baclofen (LIORESAL) 10 MG tablet Take 10 mg by mouth 3 (three) times daily.     Docusate Calcium (STOOL SOFTENER PO) Take by mouth 3 (three) times daily.     lamoTRIgine  (LAMICTAL ) 150 MG tablet TAKE 1 TABLET BY MOUTH 2 TIMES A DAY 180 tablet 0   levothyroxine (SYNTHROID, LEVOTHROID) 125 MCG tablet Take 125 mcg by mouth daily before breakfast.      Magnesium  200 MG TABS Take by mouth daily.     Multiple Vitamins-Minerals (WOMENS MULTIVITAMIN PLUS PO) Take 1 tablet by mouth daily.      Polyethylene Glycol 3350 (MIRALAX PO) Take by mouth daily.     verapamil  (CALAN -SR) 240 MG CR tablet Take 240 mg by mouth daily.     zonisamide (ZONEGRAN) 25 MG capsule Take 100 mg by mouth at bedtime.     lithium  carbonate (LITHOBID ) 300 MG ER tablet Take 2 tablets (600 mg total) by mouth daily. 180 tablet 0   PARoxetine  (PAXIL ) 40 MG tablet Take 1 tablet (40 mg  total) by mouth daily. 90 tablet 1   QUEtiapine  (SEROQUEL ) 100 MG tablet Take 1 tablet (100 mg total) by mouth at bedtime. 90 tablet 1   No current facility-administered medications for this  visit.    Medication Side Effects: Other: night eating may be related., tremor  Allergies:  Allergies  Allergen Reactions   Perphenazine Anaphylaxis and Swelling    Throat swelling and facial muscle contractions     Imipramine Other (See Comments)    Can't remember adverse effect   Penicillins Rash   Albuterol    Other     Phenegran   Codeine Rash    Past Medical History:  Diagnosis Date   Depression    Hashimoto's disease    Kidney stones    Migraine    Palpitations     Family History  Problem Relation Age of Onset   Depression Mother    Migraines Mother    Obesity Mother    Rheum arthritis Sister    Migraines Sister    Hypertension Sister     Social History   Socioeconomic History   Marital status: Married    Spouse name: Acupuncturist   Number of children: 4   Years of education: 14   Highest education level: Some college, no degree  Occupational History   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 She lives with her husband and 4 children in a one level home. She drinks one glass of tea (8-16 oz) a day. She does not exercise. Some college.   Social Drivers of Corporate Investment Banker Strain: Not on file  Food Insecurity: Not on file  Transportation Needs: Not on file  Physical Activity: Not on file  Stress: Not on file  Social Connections: Not on file  Intimate Partner  Violence: Not on file    Past Medical History, Surgical history, Social history, and Family history were reviewed and updated as appropriate.   Please see review of systems for further details on the patient's review from today.   Objective:   Physical Exam:  There were no vitals taken for this visit.  Physical Exam Constitutional:      General: She is not in acute distress.    Appearance: She is well-developed.  Musculoskeletal:        General: No deformity.  Neurological:     Mental Status: She is alert and oriented to person, place, and time.     Motor: No tremor.     Coordination: Coordination normal.     Gait: Gait normal.  Psychiatric:        Attention and Perception: She is attentive.        Mood and Affect: Mood is anxious. Mood is not depressed. Affect is not labile, blunt, angry or tearful.        Speech: Speech normal.        Behavior: Behavior normal.        Thought Content: Thought content is not paranoid or delusional. Thought content does not include homicidal or suicidal ideation. Thought content does not include suicidal plan.        Cognition and Memory: Cognition normal.        Judgment: Judgment normal.  Comments: Insight and general self-awareness is mildly   better mood and affect. Denies any SI     Lab Review:     Component Value Date/Time   NA 140 06/03/2022 1449   K 4.3 06/03/2022 1449   CL 104 06/03/2022 1449   CO2 21 06/03/2022 1449   GLUCOSE 81 06/03/2022 1449   GLUCOSE 101 (H) 12/23/2021 1325   BUN 11 06/03/2022 1449   CREATININE 0.93 06/03/2022 1449   CREATININE 0.85 05/10/2020 0940   CALCIUM 9.3 06/03/2022 1449   PROT 7.1 06/03/2022 1449   ALBUMIN 4.4 06/03/2022 1449   AST 21 06/03/2022 1449   ALT 22 06/03/2022 1449   ALKPHOS 113 06/03/2022 1449   BILITOT 0.2 06/03/2022 1449   GFRNONAA >60 12/23/2021 1325   GFRAA >90 08/23/2013 1600       Component Value Date/Time   WBC 9.0 06/03/2022 1449   WBC 6.4 12/23/2021 1325    RBC 4.83 06/03/2022 1449   RBC 5.04 12/23/2021 1325   HGB 14.0 06/03/2022 1449   HCT 42.7 06/03/2022 1449   PLT 310 06/03/2022 1449   MCV 88 06/03/2022 1449   MCH 29.0 06/03/2022 1449   MCH 27.2 12/23/2021 1325   MCHC 32.8 06/03/2022 1449   MCHC 32.5 12/23/2021 1325   RDW 12.1 06/03/2022 1449   LYMPHSABS 2.1 01/17/2011 1812   MONOABS 0.7 01/17/2011 1812   EOSABS 0.1 01/17/2011 1812   BASOSABS 0.0 01/17/2011 1812    Lithium  Lvl  Date Value Ref Range Status  06/04/2022 0.5 (L) 0.6 - 1.2 mmol/L Final  12/23/21 lithium  level 0.43 afternoon on 600 mg HS  09/26/23 Cr 1.16, normal calcium    No results found for: PHENYTOIN, PHENOBARB, VALPROATE, CBMZ   .res Assessment: Plan:    Major depressive disorder, recurrent episode, moderate (HCC) - Plan: QUEtiapine  (SEROQUEL ) 100 MG tablet, PARoxetine  (PAXIL ) 40 MG tablet, lithium  carbonate (LITHOBID ) 300 MG ER tablet, Lithium  level  Panic disorder with agoraphobia - Plan: PARoxetine  (PAXIL ) 40 MG tablet  Generalized anxiety disorder - Plan: QUEtiapine  (SEROQUEL ) 100 MG tablet, PARoxetine  (PAXIL ) 40 MG tablet  Social anxiety disorder - Plan: PARoxetine  (PAXIL ) 40 MG tablet  Attention deficit hyperactivity disorder (ADHD), predominantly inattentive type  Insomnia due to mental condition - Plan: QUEtiapine  (SEROQUEL ) 100 MG tablet  Lithium  use   Hx severe TRD with Suicide attempt.  Insight and general self-awareness is moderately impaired chronically complicating and delaying treatment.  Also she has not kept appointments as frequently as recommended.  She is not having panic attacks and overall depression is reasonably managed though her motivation and energy are low.  She is somewhat chronically dysfunctional to some degree. Disc her questions about memory effects of meds.  Consider reduction of lamotrigine  if laboratory tests do not show reasons for memory problems.SABRA  MANAGING quetiapine  better.  Discussed potential metabolic  side effects associated with atypical antipsychotics, as well as potential risk for movement side effects. Advised pt to contact office if movement side effects occur.   Counseled patient regarding potential benefits, risks, and side effects of lithium  to include potential risk of lithium  affecting thyroid  and renal function.  Discussed need for periodic lab monitoring to determine drug level and to assess for potential adverse effects.  Counseled patient regarding signs and symptoms of lithium  toxicity and advised that they notify office immediately or seek urgent medical attention if experiencing these signs and symptoms.  Patient advised to contact office with any questions or concerns. Lithium  level good October2023  0.5  Continue meds: OK continue lower dose lithium  600 mg daily but not lower.  She's consistent. Checked  Worse with reduction in lamotrigine  gradually to 150 mg daily and better on BID Better mood with paroxetine  40  Take B12 bc on omeprazole .  Was Better with NAC 1800 mg daily.  For memory complaints continue NAC bc it helped..   quetiapine  50-100 mg HS   Constipation worse  Counseling 20 min:  guilt issues.  Disc alexithymia and previous negative effects and potential negative effects.  Disc stressors and poor management lately with worsening HA as primary or secondary to mental health. Supportive therapy caretaking self defeating mother. Started teacher, music.  Constipation management 1.  Lots of water 2.  Powdered fiber supplement such as MiraLAX, Citrucel, etc. preferably with a meal 3.  2-4 stool softeners a day 4.  Milk of magnesia or magnesium  tablets if needed 5.  Senna tablets  Don't change meds on your own DT severe illness in the past.  Lithium  level ASAP  FU 3-4 mos  Lorene Macintosh, MD, DFAPA   Please see After Visit Summary for patient specific instructions.  No future appointments.     Orders Placed This Encounter  Procedures   Lithium   level       -------------------------------

## 2024-07-06 ENCOUNTER — Other Ambulatory Visit: Payer: Self-pay | Admitting: Psychiatry

## 2024-07-06 DIAGNOSIS — F331 Major depressive disorder, recurrent, moderate: Secondary | ICD-10-CM

## 2024-10-06 ENCOUNTER — Ambulatory Visit: Admitting: Psychiatry
